# Patient Record
Sex: Female | Born: 1945 | Race: White | Hispanic: No | Marital: Married | Smoking: Never smoker
Health system: Southern US, Community
[De-identification: ages and names within clinical notes are randomized; demographics above are authoritative.]

## PROBLEM LIST (undated history)

## (undated) DIAGNOSIS — N809 Endometriosis, unspecified: Secondary | ICD-10-CM

## (undated) DIAGNOSIS — F32A Depression, unspecified: Secondary | ICD-10-CM

## (undated) DIAGNOSIS — K635 Polyp of colon: Secondary | ICD-10-CM

## (undated) DIAGNOSIS — E7800 Pure hypercholesterolemia, unspecified: Secondary | ICD-10-CM

## (undated) DIAGNOSIS — R000 Tachycardia, unspecified: Secondary | ICD-10-CM

## (undated) DIAGNOSIS — Z8619 Personal history of other infectious and parasitic diseases: Secondary | ICD-10-CM

## (undated) DIAGNOSIS — F329 Major depressive disorder, single episode, unspecified: Secondary | ICD-10-CM

## (undated) DIAGNOSIS — I10 Essential (primary) hypertension: Secondary | ICD-10-CM

## (undated) DIAGNOSIS — N200 Calculus of kidney: Secondary | ICD-10-CM

## (undated) DIAGNOSIS — F419 Anxiety disorder, unspecified: Secondary | ICD-10-CM

## (undated) DIAGNOSIS — I319 Disease of pericardium, unspecified: Secondary | ICD-10-CM

## (undated) DIAGNOSIS — E119 Type 2 diabetes mellitus without complications: Secondary | ICD-10-CM

## (undated) HISTORY — DX: Major depressive disorder, single episode, unspecified: F32.9

## (undated) HISTORY — PX: APPENDECTOMY: SHX54

## (undated) HISTORY — DX: Type 2 diabetes mellitus without complications: E11.9

## (undated) HISTORY — DX: Depression, unspecified: F32.A

## (undated) HISTORY — DX: Endometriosis, unspecified: N80.9

## (undated) HISTORY — PX: BACK SURGERY: SHX140

## (undated) HISTORY — DX: Polyp of colon: K63.5

## (undated) HISTORY — DX: Essential (primary) hypertension: I10

## (undated) HISTORY — DX: Disease of pericardium, unspecified: I31.9

## (undated) HISTORY — PX: CHOLECYSTECTOMY: SHX55

## (undated) HISTORY — DX: Personal history of other infectious and parasitic diseases: Z86.19

## (undated) HISTORY — DX: Pure hypercholesterolemia, unspecified: E78.00

## (undated) HISTORY — DX: Calculus of kidney: N20.0

## (undated) HISTORY — DX: Tachycardia, unspecified: R00.0

## (undated) HISTORY — DX: Anxiety disorder, unspecified: F41.9

## (undated) HISTORY — PX: TONSILLECTOMY: SUR1361

---

## 1978-09-24 HISTORY — PX: ABDOMINAL HYSTERECTOMY: SHX81

## 1980-09-24 HISTORY — PX: OOPHORECTOMY: SHX86

## 2004-10-24 ENCOUNTER — Inpatient Hospital Stay: Payer: Self-pay | Admitting: Internal Medicine

## 2005-03-31 ENCOUNTER — Other Ambulatory Visit: Payer: Self-pay

## 2005-03-31 ENCOUNTER — Inpatient Hospital Stay: Payer: Self-pay | Admitting: Rheumatology

## 2007-02-04 ENCOUNTER — Ambulatory Visit: Payer: Self-pay | Admitting: Emergency Medicine

## 2007-02-25 ENCOUNTER — Ambulatory Visit: Payer: Self-pay | Admitting: Internal Medicine

## 2007-04-07 ENCOUNTER — Ambulatory Visit: Payer: Self-pay | Admitting: Gastroenterology

## 2007-04-27 ENCOUNTER — Ambulatory Visit: Payer: Self-pay | Admitting: Internal Medicine

## 2007-05-19 ENCOUNTER — Ambulatory Visit: Payer: Self-pay | Admitting: Gastroenterology

## 2007-09-21 ENCOUNTER — Ambulatory Visit: Payer: Self-pay | Admitting: Emergency Medicine

## 2008-03-30 ENCOUNTER — Ambulatory Visit: Payer: Self-pay | Admitting: Gastroenterology

## 2008-04-13 ENCOUNTER — Ambulatory Visit: Payer: Self-pay | Admitting: Gastroenterology

## 2008-04-13 ENCOUNTER — Ambulatory Visit: Payer: Self-pay | Admitting: Internal Medicine

## 2008-05-16 ENCOUNTER — Ambulatory Visit: Payer: Self-pay | Admitting: Family Medicine

## 2009-01-24 ENCOUNTER — Emergency Department: Payer: Self-pay | Admitting: Emergency Medicine

## 2009-07-15 ENCOUNTER — Ambulatory Visit: Payer: Self-pay | Admitting: Family Medicine

## 2009-10-11 ENCOUNTER — Ambulatory Visit: Payer: Self-pay | Admitting: Family Medicine

## 2010-07-21 ENCOUNTER — Ambulatory Visit: Payer: Self-pay | Admitting: Gastroenterology

## 2010-07-24 ENCOUNTER — Ambulatory Visit: Payer: Self-pay | Admitting: Gastroenterology

## 2010-07-24 LAB — HM COLONOSCOPY

## 2011-05-03 ENCOUNTER — Ambulatory Visit: Payer: Self-pay

## 2011-10-15 DIAGNOSIS — I10 Essential (primary) hypertension: Secondary | ICD-10-CM | POA: Diagnosis not present

## 2011-10-15 DIAGNOSIS — E119 Type 2 diabetes mellitus without complications: Secondary | ICD-10-CM | POA: Diagnosis not present

## 2011-10-15 DIAGNOSIS — N390 Urinary tract infection, site not specified: Secondary | ICD-10-CM | POA: Diagnosis not present

## 2011-10-29 DIAGNOSIS — H4420 Degenerative myopia, unspecified eye: Secondary | ICD-10-CM | POA: Diagnosis not present

## 2011-10-29 DIAGNOSIS — H35059 Retinal neovascularization, unspecified, unspecified eye: Secondary | ICD-10-CM | POA: Diagnosis not present

## 2011-10-29 DIAGNOSIS — H35019 Changes in retinal vascular appearance, unspecified eye: Secondary | ICD-10-CM | POA: Diagnosis not present

## 2011-10-29 DIAGNOSIS — H33059 Total retinal detachment, unspecified eye: Secondary | ICD-10-CM | POA: Diagnosis not present

## 2011-11-07 DIAGNOSIS — N390 Urinary tract infection, site not specified: Secondary | ICD-10-CM | POA: Diagnosis not present

## 2011-11-26 DIAGNOSIS — J209 Acute bronchitis, unspecified: Secondary | ICD-10-CM | POA: Diagnosis not present

## 2011-11-26 DIAGNOSIS — J019 Acute sinusitis, unspecified: Secondary | ICD-10-CM | POA: Diagnosis not present

## 2011-11-26 DIAGNOSIS — R05 Cough: Secondary | ICD-10-CM | POA: Diagnosis not present

## 2011-11-28 DIAGNOSIS — K219 Gastro-esophageal reflux disease without esophagitis: Secondary | ICD-10-CM | POA: Diagnosis not present

## 2011-12-04 ENCOUNTER — Ambulatory Visit: Payer: Self-pay | Admitting: Gastroenterology

## 2011-12-04 DIAGNOSIS — R059 Cough, unspecified: Secondary | ICD-10-CM | POA: Diagnosis not present

## 2011-12-04 DIAGNOSIS — R1013 Epigastric pain: Secondary | ICD-10-CM | POA: Diagnosis not present

## 2011-12-04 DIAGNOSIS — K2940 Chronic atrophic gastritis without bleeding: Secondary | ICD-10-CM | POA: Diagnosis not present

## 2011-12-04 DIAGNOSIS — I10 Essential (primary) hypertension: Secondary | ICD-10-CM | POA: Diagnosis not present

## 2011-12-04 DIAGNOSIS — K2990 Gastroduodenitis, unspecified, without bleeding: Secondary | ICD-10-CM | POA: Diagnosis not present

## 2011-12-04 DIAGNOSIS — K219 Gastro-esophageal reflux disease without esophagitis: Secondary | ICD-10-CM | POA: Diagnosis not present

## 2011-12-04 DIAGNOSIS — K2970 Gastritis, unspecified, without bleeding: Secondary | ICD-10-CM | POA: Diagnosis not present

## 2011-12-04 DIAGNOSIS — Z7982 Long term (current) use of aspirin: Secondary | ICD-10-CM | POA: Diagnosis not present

## 2011-12-04 DIAGNOSIS — Z79899 Other long term (current) drug therapy: Secondary | ICD-10-CM | POA: Diagnosis not present

## 2011-12-04 DIAGNOSIS — E119 Type 2 diabetes mellitus without complications: Secondary | ICD-10-CM | POA: Diagnosis not present

## 2011-12-04 DIAGNOSIS — I498 Other specified cardiac arrhythmias: Secondary | ICD-10-CM | POA: Diagnosis not present

## 2011-12-25 DIAGNOSIS — M48061 Spinal stenosis, lumbar region without neurogenic claudication: Secondary | ICD-10-CM | POA: Diagnosis not present

## 2011-12-25 DIAGNOSIS — IMO0002 Reserved for concepts with insufficient information to code with codable children: Secondary | ICD-10-CM | POA: Diagnosis not present

## 2011-12-27 DIAGNOSIS — H35019 Changes in retinal vascular appearance, unspecified eye: Secondary | ICD-10-CM | POA: Diagnosis not present

## 2011-12-27 DIAGNOSIS — E119 Type 2 diabetes mellitus without complications: Secondary | ICD-10-CM | POA: Diagnosis not present

## 2011-12-27 DIAGNOSIS — Z961 Presence of intraocular lens: Secondary | ICD-10-CM | POA: Diagnosis not present

## 2011-12-27 DIAGNOSIS — H4050X0 Glaucoma secondary to other eye disorders, unspecified eye, stage unspecified: Secondary | ICD-10-CM | POA: Diagnosis not present

## 2012-01-07 DIAGNOSIS — I10 Essential (primary) hypertension: Secondary | ICD-10-CM | POA: Diagnosis not present

## 2012-01-07 DIAGNOSIS — E119 Type 2 diabetes mellitus without complications: Secondary | ICD-10-CM | POA: Diagnosis not present

## 2012-01-07 DIAGNOSIS — E7800 Pure hypercholesterolemia, unspecified: Secondary | ICD-10-CM | POA: Diagnosis not present

## 2012-01-09 DIAGNOSIS — R5381 Other malaise: Secondary | ICD-10-CM | POA: Diagnosis not present

## 2012-01-09 DIAGNOSIS — E7800 Pure hypercholesterolemia, unspecified: Secondary | ICD-10-CM | POA: Diagnosis not present

## 2012-01-09 DIAGNOSIS — E119 Type 2 diabetes mellitus without complications: Secondary | ICD-10-CM | POA: Diagnosis not present

## 2012-01-09 DIAGNOSIS — I10 Essential (primary) hypertension: Secondary | ICD-10-CM | POA: Diagnosis not present

## 2012-01-09 DIAGNOSIS — Z79899 Other long term (current) drug therapy: Secondary | ICD-10-CM | POA: Diagnosis not present

## 2012-01-10 DIAGNOSIS — M5030 Other cervical disc degeneration, unspecified cervical region: Secondary | ICD-10-CM | POA: Diagnosis not present

## 2012-01-10 DIAGNOSIS — IMO0002 Reserved for concepts with insufficient information to code with codable children: Secondary | ICD-10-CM | POA: Diagnosis not present

## 2012-01-10 DIAGNOSIS — M5126 Other intervertebral disc displacement, lumbar region: Secondary | ICD-10-CM | POA: Diagnosis not present

## 2012-01-10 DIAGNOSIS — M48061 Spinal stenosis, lumbar region without neurogenic claudication: Secondary | ICD-10-CM | POA: Diagnosis not present

## 2012-01-10 DIAGNOSIS — M47817 Spondylosis without myelopathy or radiculopathy, lumbosacral region: Secondary | ICD-10-CM | POA: Diagnosis not present

## 2012-02-04 DIAGNOSIS — H4420 Degenerative myopia, unspecified eye: Secondary | ICD-10-CM | POA: Diagnosis not present

## 2012-02-04 DIAGNOSIS — H33019 Retinal detachment with single break, unspecified eye: Secondary | ICD-10-CM | POA: Diagnosis not present

## 2012-02-04 DIAGNOSIS — H35019 Changes in retinal vascular appearance, unspecified eye: Secondary | ICD-10-CM | POA: Diagnosis not present

## 2012-03-18 DIAGNOSIS — M719 Bursopathy, unspecified: Secondary | ICD-10-CM | POA: Diagnosis not present

## 2012-03-18 DIAGNOSIS — IMO0002 Reserved for concepts with insufficient information to code with codable children: Secondary | ICD-10-CM | POA: Diagnosis not present

## 2012-03-18 DIAGNOSIS — M47817 Spondylosis without myelopathy or radiculopathy, lumbosacral region: Secondary | ICD-10-CM | POA: Diagnosis not present

## 2012-04-03 DIAGNOSIS — E7800 Pure hypercholesterolemia, unspecified: Secondary | ICD-10-CM | POA: Diagnosis not present

## 2012-04-03 DIAGNOSIS — I10 Essential (primary) hypertension: Secondary | ICD-10-CM | POA: Diagnosis not present

## 2012-04-03 DIAGNOSIS — E119 Type 2 diabetes mellitus without complications: Secondary | ICD-10-CM | POA: Diagnosis not present

## 2012-04-04 DIAGNOSIS — M25519 Pain in unspecified shoulder: Secondary | ICD-10-CM | POA: Diagnosis not present

## 2012-04-04 DIAGNOSIS — N951 Menopausal and female climacteric states: Secondary | ICD-10-CM | POA: Diagnosis not present

## 2012-04-04 DIAGNOSIS — E7800 Pure hypercholesterolemia, unspecified: Secondary | ICD-10-CM | POA: Diagnosis not present

## 2012-04-04 DIAGNOSIS — M67919 Unspecified disorder of synovium and tendon, unspecified shoulder: Secondary | ICD-10-CM | POA: Diagnosis not present

## 2012-04-10 ENCOUNTER — Ambulatory Visit: Payer: Self-pay | Admitting: Physical Medicine and Rehabilitation

## 2012-04-10 DIAGNOSIS — M67919 Unspecified disorder of synovium and tendon, unspecified shoulder: Secondary | ICD-10-CM | POA: Diagnosis not present

## 2012-04-10 DIAGNOSIS — M25519 Pain in unspecified shoulder: Secondary | ICD-10-CM | POA: Diagnosis not present

## 2012-04-10 DIAGNOSIS — S46819A Strain of other muscles, fascia and tendons at shoulder and upper arm level, unspecified arm, initial encounter: Secondary | ICD-10-CM | POA: Diagnosis not present

## 2012-04-10 DIAGNOSIS — M719 Bursopathy, unspecified: Secondary | ICD-10-CM | POA: Diagnosis not present

## 2012-04-15 DIAGNOSIS — I10 Essential (primary) hypertension: Secondary | ICD-10-CM | POA: Diagnosis not present

## 2012-04-17 DIAGNOSIS — H409 Unspecified glaucoma: Secondary | ICD-10-CM | POA: Diagnosis not present

## 2012-04-17 DIAGNOSIS — Z961 Presence of intraocular lens: Secondary | ICD-10-CM | POA: Diagnosis not present

## 2012-04-21 DIAGNOSIS — M25549 Pain in joints of unspecified hand: Secondary | ICD-10-CM | POA: Diagnosis not present

## 2012-04-21 DIAGNOSIS — IMO0002 Reserved for concepts with insufficient information to code with codable children: Secondary | ICD-10-CM | POA: Diagnosis not present

## 2012-04-21 DIAGNOSIS — M75120 Complete rotator cuff tear or rupture of unspecified shoulder, not specified as traumatic: Secondary | ICD-10-CM | POA: Diagnosis not present

## 2012-04-21 DIAGNOSIS — M47817 Spondylosis without myelopathy or radiculopathy, lumbosacral region: Secondary | ICD-10-CM | POA: Diagnosis not present

## 2012-05-12 DIAGNOSIS — H4420 Degenerative myopia, unspecified eye: Secondary | ICD-10-CM | POA: Diagnosis not present

## 2012-05-12 DIAGNOSIS — H3320 Serous retinal detachment, unspecified eye: Secondary | ICD-10-CM | POA: Diagnosis not present

## 2012-05-27 DIAGNOSIS — M47817 Spondylosis without myelopathy or radiculopathy, lumbosacral region: Secondary | ICD-10-CM | POA: Diagnosis not present

## 2012-05-27 DIAGNOSIS — IMO0002 Reserved for concepts with insufficient information to code with codable children: Secondary | ICD-10-CM | POA: Diagnosis not present

## 2012-05-27 DIAGNOSIS — M12819 Other specific arthropathies, not elsewhere classified, unspecified shoulder: Secondary | ICD-10-CM | POA: Diagnosis not present

## 2012-06-03 DIAGNOSIS — H6120 Impacted cerumen, unspecified ear: Secondary | ICD-10-CM | POA: Diagnosis not present

## 2012-06-03 DIAGNOSIS — H9319 Tinnitus, unspecified ear: Secondary | ICD-10-CM | POA: Diagnosis not present

## 2012-06-03 DIAGNOSIS — H903 Sensorineural hearing loss, bilateral: Secondary | ICD-10-CM | POA: Diagnosis not present

## 2012-06-24 DIAGNOSIS — M5126 Other intervertebral disc displacement, lumbar region: Secondary | ICD-10-CM | POA: Diagnosis not present

## 2012-06-24 DIAGNOSIS — M25559 Pain in unspecified hip: Secondary | ICD-10-CM | POA: Diagnosis not present

## 2012-06-24 DIAGNOSIS — Z23 Encounter for immunization: Secondary | ICD-10-CM | POA: Diagnosis not present

## 2012-06-24 DIAGNOSIS — IMO0002 Reserved for concepts with insufficient information to code with codable children: Secondary | ICD-10-CM | POA: Diagnosis not present

## 2012-06-24 DIAGNOSIS — M47817 Spondylosis without myelopathy or radiculopathy, lumbosacral region: Secondary | ICD-10-CM | POA: Diagnosis not present

## 2012-07-14 DIAGNOSIS — M25519 Pain in unspecified shoulder: Secondary | ICD-10-CM | POA: Diagnosis not present

## 2012-07-14 DIAGNOSIS — M47812 Spondylosis without myelopathy or radiculopathy, cervical region: Secondary | ICD-10-CM | POA: Diagnosis not present

## 2012-07-14 DIAGNOSIS — M4802 Spinal stenosis, cervical region: Secondary | ICD-10-CM | POA: Diagnosis not present

## 2012-07-30 ENCOUNTER — Ambulatory Visit: Payer: Self-pay | Admitting: Internal Medicine

## 2012-07-30 ENCOUNTER — Telehealth: Payer: Self-pay | Admitting: Internal Medicine

## 2012-07-30 NOTE — Telephone Encounter (Signed)
I can see her at 11:45 on 08/13/12.   Let me know if problem.

## 2012-07-30 NOTE — Telephone Encounter (Signed)
Pt was scheduled today but had to cancel because she takes her mom to her appointments as well. She apologizes and was wanting to know if she could see you sometime soon because she says she is having some problems ???

## 2012-07-31 NOTE — Telephone Encounter (Signed)
Left message for pt to call back so we could schedule.

## 2012-07-31 NOTE — Telephone Encounter (Signed)
Pt is aware of appointment 

## 2012-08-13 ENCOUNTER — Ambulatory Visit (INDEPENDENT_AMBULATORY_CARE_PROVIDER_SITE_OTHER): Payer: Medicare Other | Admitting: Internal Medicine

## 2012-08-13 ENCOUNTER — Other Ambulatory Visit: Payer: Self-pay | Admitting: Internal Medicine

## 2012-08-13 ENCOUNTER — Encounter: Payer: Self-pay | Admitting: Internal Medicine

## 2012-08-13 VITALS — BP 132/88 | HR 93 | Temp 98.0°F | Ht 64.5 in | Wt 177.8 lb

## 2012-08-13 DIAGNOSIS — N760 Acute vaginitis: Secondary | ICD-10-CM | POA: Diagnosis not present

## 2012-08-13 DIAGNOSIS — N390 Urinary tract infection, site not specified: Secondary | ICD-10-CM | POA: Diagnosis not present

## 2012-08-13 DIAGNOSIS — I10 Essential (primary) hypertension: Secondary | ICD-10-CM

## 2012-08-13 DIAGNOSIS — K219 Gastro-esophageal reflux disease without esophagitis: Secondary | ICD-10-CM

## 2012-08-13 DIAGNOSIS — M549 Dorsalgia, unspecified: Secondary | ICD-10-CM

## 2012-08-13 DIAGNOSIS — G8929 Other chronic pain: Secondary | ICD-10-CM

## 2012-08-13 DIAGNOSIS — E7800 Pure hypercholesterolemia, unspecified: Secondary | ICD-10-CM | POA: Diagnosis not present

## 2012-08-13 DIAGNOSIS — E119 Type 2 diabetes mellitus without complications: Secondary | ICD-10-CM

## 2012-08-13 LAB — POCT URINALYSIS DIPSTICK
Blood, UA: NEGATIVE
Glucose, UA: NEGATIVE
Ketones, UA: NEGATIVE
Protein, UA: NEGATIVE
Spec Grav, UA: 1.02
Urobilinogen, UA: 1

## 2012-08-13 MED ORDER — NYSTATIN 100000 UNIT/GM EX CREA: TOPICAL_CREAM | Freq: Two times a day (BID) | CUTANEOUS | Status: DC

## 2012-08-13 NOTE — Patient Instructions (Addendum)
It was nice seeing you today.  We will notify you of your results once they are available.  I am going to give you nystatin cream to apply externally - twice a day. Let me know if problems.

## 2012-08-15 DIAGNOSIS — H3320 Serous retinal detachment, unspecified eye: Secondary | ICD-10-CM | POA: Diagnosis not present

## 2012-08-15 DIAGNOSIS — H3530 Unspecified macular degeneration: Secondary | ICD-10-CM | POA: Diagnosis not present

## 2012-08-16 LAB — WET PREP BY MOLECULAR PROBE
Candida species: POSITIVE — AB
Trichomonas vaginosis: NEGATIVE

## 2012-08-18 ENCOUNTER — Other Ambulatory Visit (INDEPENDENT_AMBULATORY_CARE_PROVIDER_SITE_OTHER): Payer: Medicare Other

## 2012-08-18 DIAGNOSIS — I10 Essential (primary) hypertension: Secondary | ICD-10-CM

## 2012-08-18 DIAGNOSIS — E119 Type 2 diabetes mellitus without complications: Secondary | ICD-10-CM | POA: Diagnosis not present

## 2012-08-18 DIAGNOSIS — E7800 Pure hypercholesterolemia, unspecified: Secondary | ICD-10-CM

## 2012-08-18 LAB — BASIC METABOLIC PANEL
Chloride: 104 mEq/L (ref 96–112)
GFR: 110.39 mL/min (ref >60.00)
Potassium: 3.8 mEq/L (ref 3.5–5.1)
Sodium: 138 mEq/L (ref 135–145)

## 2012-08-18 LAB — LIPID PANEL
HDL: 53.2 mg/dL (ref >39.00)
Triglycerides: 81 mg/dL (ref 0.0–149.0)

## 2012-08-18 LAB — HEPATIC FUNCTION PANEL
ALT: 18 U/L (ref 0–35)
AST: 15 U/L (ref 0–37)
Albumin: 3.9 g/dL (ref 3.5–5.2)
Alkaline Phosphatase: 66 U/L (ref 39–117)

## 2012-08-18 LAB — HEMOGLOBIN A1C: Hgb A1c MFr Bld: 6.7 % — ABNORMAL HIGH (ref 4.6–6.5)

## 2012-08-20 ENCOUNTER — Encounter: Payer: Self-pay | Admitting: *Deleted

## 2012-08-24 ENCOUNTER — Encounter: Payer: Self-pay | Admitting: Internal Medicine

## 2012-08-24 DIAGNOSIS — E119 Type 2 diabetes mellitus without complications: Secondary | ICD-10-CM | POA: Insufficient documentation

## 2012-08-24 DIAGNOSIS — I10 Essential (primary) hypertension: Secondary | ICD-10-CM | POA: Insufficient documentation

## 2012-08-24 DIAGNOSIS — E7800 Pure hypercholesterolemia, unspecified: Secondary | ICD-10-CM | POA: Insufficient documentation

## 2012-08-24 DIAGNOSIS — G8929 Other chronic pain: Secondary | ICD-10-CM | POA: Insufficient documentation

## 2012-08-24 DIAGNOSIS — K219 Gastro-esophageal reflux disease without esophagitis: Secondary | ICD-10-CM | POA: Insufficient documentation

## 2012-08-24 NOTE — Assessment & Plan Note (Signed)
Blood pressure under good control.  Same meds.  Check metabolic panel with next labs.    

## 2012-08-24 NOTE — Assessment & Plan Note (Signed)
On lipitor.  Low cholesterol diet.  Check lipid panel and liver function.   

## 2012-08-24 NOTE — Assessment & Plan Note (Signed)
Low carb diet and exercise.  Did not bring in any sugar readings.  Check blood sugar bid.  Record.  Up to date with eye checks.  Check met b and a1c.

## 2012-08-24 NOTE — Assessment & Plan Note (Signed)
EGD 12/04/11 revealed gastritis.  On Protonix and carafate.  Symptoms improved.  Continue follow up with Dr Marva Panda.

## 2012-08-24 NOTE — Assessment & Plan Note (Signed)
Has seen neurosurgery.  Not interested in surgery.  Has had multiple injections.  Seeing Dr Yves Dill.  Continue follow up with him and ortho.  On vicodin and neurontin.

## 2012-08-24 NOTE — Progress Notes (Signed)
Subjective:    Patient ID: Sherry Simon, female    DOB: 02-08-1946, 66 y.o.   MRN: [id number]  HPI 66 year old female with past history of tachycardia, palpitations, anxiety/depression, diabetes, hypertension and pericarditis of unknown origin.  She comes in today for a scheduled follow up.  Seeing Dr Yves Dill.  S/p injections.  Helping.  Having some issues with her left rotator cuff.  Takes vicodin and neurontin.  Is having some perivaginal irritation and some vaginal discharge.  No itching.  Wants to remain on this for now.  Some occasional acid reflux.  On protonix.  Will add zantac.  No chest pain or tightness reported.  Increased stress with her husband's medical issues.  She feels she is handling things relatively well.   Past Medical History  Diagnosis Date  . Hypertension   . Diabetes mellitus   . Hypercholesterolemia   . Endometriosis     requiring hysterectomy  . Nephrolithiasis   . Anxiety and depression   . Pericarditis     recurrent, unkown origin  . Tachycardia   . Depression   . History of chicken pox   . Colon polyps     Review of Systems Patient denies any headache, lightheadedness or dizziness.  No significant sinus or allergy symptoms.  No chest pain, tightness or palpitations.  No increased shortness of breath, cough or congestion.  Breathing stable.  No nausea or vomiting.  No abdominal pain or cramping.  No bowel change, such as diarrhea, constipation, BRBPR or melana.  No urine change.  She does report the perivaginal burning and discharge as outlined.         Objective:   Physical Exam Filed Vitals:   08/13/12 1132  BP: 132/88  Pulse: 93  Temp: 98 F (68.50 C)   66 year old female in no acute distress.   HEENT:  Nares- clear.  Oropharynx - without lesions. NECK:  Supple.  Nontender.  No audible bruit.  HEART:  Appears to be regular. LUNGS:  No crackles or wheezing audible.  Respirations even and unlabored.  RADIAL PULSE:  Equal bilaterally.    ABDOMEN:  Soft, nontender.  Bowel sounds present and normal.  No audible abdominal bruit.  GU:  Normal external genitalia - minimal erythema perivaginal region.   Vaginal vault without lesions.  Discharge present. Sent off for KOH and wet prep.  Could not appreciate any adnexal masses or tenderness.  EXTREMITIES:  No increased edema present.  DP pulses palpable and equal bilaterally.           Assessment & Plan:  GU.  Perivaginal irritation.  Probable yeast. Nystatin cream as directed.  Await results of KOH and wet prep.    CARDIOVASCULAR.  ECHO 05/23/10 revealed EF 60% with mild left atrial enlargement and moderate mitral insufficiency and mild tricuspid insufficiency.  Sees Dr Okey Dupre [facility] cardiology).  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  Currently asymptomatic.    PULMONARY.  Breathing stable.  Follow.   FATIGUE.  Probably multifactorial.  Check cbc, metabolic panel and tsh.    INCREASED PSYCHOSOCIAL STRESSORS.  Increased stress with her husbands medical issues.  Increased stress with her chronic pain.  On effexor.  She feels she is handling things relatively well.  Follow.    GI.  Colonoscopy 07/24/10 revealed diverticulosis and removal of a polyp (ascending colon) - tubular adenomatous polyp.  Recommended follow up colonoscopy five years.    HEALTH MAINTENANCE.  Physical 08/27/11.  Is s/p  hysterectomy and does not require yearly pap smears.  Colonoscopy as outlined.  Mammogram 08/20/11 - negative.  Schedule follow up mammogram.

## 2012-08-28 DIAGNOSIS — Z5189 Encounter for other specified aftercare: Secondary | ICD-10-CM | POA: Diagnosis not present

## 2012-08-28 DIAGNOSIS — Z961 Presence of intraocular lens: Secondary | ICD-10-CM | POA: Diagnosis not present

## 2012-08-28 DIAGNOSIS — H0289 Other specified disorders of eyelid: Secondary | ICD-10-CM | POA: Diagnosis not present

## 2012-08-28 DIAGNOSIS — H4420 Degenerative myopia, unspecified eye: Secondary | ICD-10-CM | POA: Diagnosis not present

## 2012-09-05 ENCOUNTER — Other Ambulatory Visit: Payer: Self-pay | Admitting: Internal Medicine

## 2012-09-05 DIAGNOSIS — IMO0002 Reserved for concepts with insufficient information to code with codable children: Secondary | ICD-10-CM | POA: Diagnosis not present

## 2012-09-05 DIAGNOSIS — M47817 Spondylosis without myelopathy or radiculopathy, lumbosacral region: Secondary | ICD-10-CM | POA: Diagnosis not present

## 2012-09-05 DIAGNOSIS — M5126 Other intervertebral disc displacement, lumbar region: Secondary | ICD-10-CM | POA: Diagnosis not present

## 2012-09-05 NOTE — Telephone Encounter (Signed)
Called in refill x 6 to [facility]

## 2012-09-05 NOTE — Telephone Encounter (Signed)
Refill - Pharmacy - [facility] [address]  Valinda Hoar #[phone] Drug Name-Estradiol TDS 0.1 mg /day Strength - Directions- apply 1 patch to skin once a week as directed  Quantity-prescribed  2 refills

## 2012-09-05 NOTE — Telephone Encounter (Signed)
Pt is needing refill on Estradiol patch. [facility] [place]. Pt is completely out.

## 2012-09-09 ENCOUNTER — Encounter: Payer: Self-pay | Admitting: Internal Medicine

## 2012-09-09 ENCOUNTER — Ambulatory Visit (INDEPENDENT_AMBULATORY_CARE_PROVIDER_SITE_OTHER): Payer: Medicare Other | Admitting: Internal Medicine

## 2012-09-09 VITALS — BP 132/74 | HR 94 | Temp 98.4°F | Ht 64.5 in | Wt 179.5 lb

## 2012-09-09 DIAGNOSIS — N390 Urinary tract infection, site not specified: Secondary | ICD-10-CM | POA: Diagnosis not present

## 2012-09-09 DIAGNOSIS — I10 Essential (primary) hypertension: Secondary | ICD-10-CM

## 2012-09-09 LAB — POCT URINALYSIS DIPSTICK
Spec Grav, UA: >=1.03
pH, UA: 6

## 2012-09-09 MED ORDER — NITROFURANTOIN MONOHYD MACRO 100 MG PO CAPS: 100.0000 mg | ORAL_CAPSULE | Freq: Two times a day (BID) | ORAL | Status: DC

## 2012-09-10 ENCOUNTER — Ambulatory Visit: Payer: Self-pay | Admitting: Internal Medicine

## 2012-09-13 LAB — URINE CULTURE

## 2012-09-14 ENCOUNTER — Encounter: Payer: Self-pay | Admitting: Internal Medicine

## 2012-09-14 NOTE — Assessment & Plan Note (Signed)
Blood pressure under good control.  Follow.   

## 2012-09-14 NOTE — Progress Notes (Signed)
  Subjective:    Patient ID: Sherry Simon, female    DOB: April 18, 1946, 66 y.o.   MRN: [id number]  Urinary Tract Infection   66 year old female with past history of tachycardia, palpitations, anxiety/depression, diabetes, hypertension and pericarditis of unknown origin.  She comes in today as a work in with concerns regarding a possible urinary tract infection.  She states symptoms started last week. Some irritation on the outside of her vagina.  She has been using Nystatin.  Helps some when it first goes on.  Occasional itching.  She has now developed some dysuria.   No chest pain or tightness reported.  Increased stress with her husband's medical issues.  She feels she is handling things relatively well.   Past Medical History  Diagnosis Date  . Hypertension   . Diabetes mellitus   . Hypercholesterolemia   . Endometriosis     requiring hysterectomy  . Nephrolithiasis   . Anxiety and depression   . Pericarditis     recurrent, unkown origin  . Tachycardia   . Depression   . History of chicken pox   . Colon polyps     Review of Systems Patient denies any headache, lightheadedness or dizziness.  No significant sinus or allergy symptoms.  No chest pain, tightness or palpitations.  No increased shortness of breath, cough or congestion.  Breathing stable.  No nausea or vomiting.  No abdominal pain or cramping.  No bowel change, such as diarrhea, constipation, BRBPR or melana.  Some dysuria as outlined.  She does report the perivaginal burning as outlined.  No discharge.          Objective:   Physical Exam  Filed Vitals:   09/09/12 1432  BP: 132/74  Pulse: 94  Temp: 98.4 F (83.41 C)   66 year old female in no acute distress.  NECK:  Supple.  Nontender.    HEART:  Appears to be regular. LUNGS:  No crackles or wheezing audible.  Respirations even and unlabored.  RADIAL PULSE:  Equal bilaterally.  ABDOMEN:  Soft, nontender.  Bowel sounds present and normal.  No audible abdominal  bruit.  EXTREMITIES:  No increased edema present.  DP pulses palpable and equal bilaterally.           Assessment & Plan:  GU.  Perivaginal irritation.  Nystatin helped.  Continue.    UTI.  Urine dip positive.  Will send culture.  Treat with cipro as directed.  Follow.  Await culture results.    CARDIOVASCULAR.  ECHO 05/23/10 revealed EF 60% with mild left atrial enlargement and moderate mitral insufficiency and mild tricuspid insufficiency.  Sees Dr Okey Dupre [facility] cardiology).  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  Currently asymptomatic.    PULMONARY.  Breathing stable.  Follow.     INCREASED PSYCHOSOCIAL STRESSORS.  Increased stress with her husbands medical issues.  Increased stress with her chronic pain.  On effexor.  She feels she is handling things relatively well.  Follow.    GI.  Colonoscopy 07/24/10 revealed diverticulosis and removal of a polyp (ascending colon) - tubular adenomatous polyp.  Recommended follow up colonoscopy five years.    HEALTH MAINTENANCE.  Physical 08/27/11.  Is s/p hysterectomy and does not require yearly pap smears.  Colonoscopy as outlined.  Mammogram 08/20/11 - negative.  Mammogram should be scheduled.

## 2012-09-16 MED ORDER — CEFUROXIME AXETIL 250 MG PO TABS: 250.0000 mg | ORAL_TABLET | Freq: Two times a day (BID) | ORAL | Status: DC

## 2012-09-16 NOTE — Addendum Note (Signed)
Addended by: Marlene Lard on: 09/16/2012 02:11 PM   Modules accepted: Orders

## 2012-10-06 ENCOUNTER — Other Ambulatory Visit: Payer: Self-pay | Admitting: Internal Medicine

## 2012-10-06 MED ORDER — ATORVASTATIN CALCIUM 40 MG PO TABS: 40.0000 mg | ORAL_TABLET | Freq: Every day | ORAL | Status: DC

## 2012-10-06 NOTE — Telephone Encounter (Signed)
Sent in to pharmacy.  

## 2012-10-06 NOTE — Telephone Encounter (Signed)
atorvastatin (LIPITOR) 40 MG tablet   # 90

## 2012-10-10 ENCOUNTER — Encounter: Payer: Self-pay | Admitting: Internal Medicine

## 2012-10-13 ENCOUNTER — Ambulatory Visit (INDEPENDENT_AMBULATORY_CARE_PROVIDER_SITE_OTHER): Payer: Medicare Other | Admitting: Internal Medicine

## 2012-10-13 ENCOUNTER — Encounter: Payer: Self-pay | Admitting: Internal Medicine

## 2012-10-13 VITALS — BP 130/72 | HR 99 | Temp 98.8°F | Ht 64.5 in

## 2012-10-13 DIAGNOSIS — E7800 Pure hypercholesterolemia, unspecified: Secondary | ICD-10-CM | POA: Diagnosis not present

## 2012-10-13 DIAGNOSIS — E119 Type 2 diabetes mellitus without complications: Secondary | ICD-10-CM | POA: Diagnosis not present

## 2012-10-13 DIAGNOSIS — G8929 Other chronic pain: Secondary | ICD-10-CM | POA: Diagnosis not present

## 2012-10-13 DIAGNOSIS — I10 Essential (primary) hypertension: Secondary | ICD-10-CM

## 2012-10-13 DIAGNOSIS — N390 Urinary tract infection, site not specified: Secondary | ICD-10-CM

## 2012-10-13 DIAGNOSIS — I319 Disease of pericardium, unspecified: Secondary | ICD-10-CM | POA: Diagnosis not present

## 2012-10-13 DIAGNOSIS — M549 Dorsalgia, unspecified: Secondary | ICD-10-CM | POA: Diagnosis not present

## 2012-10-13 DIAGNOSIS — K219 Gastro-esophageal reflux disease without esophagitis: Secondary | ICD-10-CM

## 2012-10-13 LAB — POCT URINALYSIS DIPSTICK
Glucose, UA: NEGATIVE
Leukocytes, UA: NEGATIVE
Nitrite, UA: NEGATIVE
Urobilinogen, UA: 0.2

## 2012-10-13 LAB — CBC WITH DIFFERENTIAL/PLATELET
Eosinophils Absolute: 0.1 10*3/uL (ref 0.0–0.7)
Eosinophils Relative: 2.2 % (ref 0.0–5.0)
Lymphocytes Relative: 41.4 % (ref 12.0–46.0)
MCHC: 33.8 g/dL (ref 30.0–36.0)
MCV: 89 fl (ref 78.0–100.0)
Monocytes Absolute: 0.4 10*3/uL (ref 0.1–1.0)
Neutrophils Relative %: 48.5 % (ref 43.0–77.0)
Platelets: 344 10*3/uL (ref 150.0–400.0)
WBC: 5.1 10*3/uL (ref 4.5–10.5)

## 2012-10-13 LAB — BASIC METABOLIC PANEL
Calcium: 8.9 mg/dL (ref 8.4–10.5)
GFR: 100.3 mL/min (ref >60.00)
Potassium: 4.1 mEq/L (ref 3.5–5.1)
Sodium: 138 mEq/L (ref 135–145)

## 2012-10-13 LAB — SEDIMENTATION RATE: Sed Rate: 11 mm/hr (ref 0–22)

## 2012-10-14 LAB — URINE CULTURE
Colony Count: NO GROWTH
Organism ID, Bacteria: NO GROWTH

## 2012-10-20 DIAGNOSIS — IMO0002 Reserved for concepts with insufficient information to code with codable children: Secondary | ICD-10-CM | POA: Diagnosis not present

## 2012-10-20 DIAGNOSIS — M5126 Other intervertebral disc displacement, lumbar region: Secondary | ICD-10-CM | POA: Diagnosis not present

## 2012-10-20 DIAGNOSIS — M47817 Spondylosis without myelopathy or radiculopathy, lumbosacral region: Secondary | ICD-10-CM | POA: Diagnosis not present

## 2012-10-20 DIAGNOSIS — M25519 Pain in unspecified shoulder: Secondary | ICD-10-CM | POA: Diagnosis not present

## 2012-10-22 ENCOUNTER — Encounter: Payer: Self-pay | Admitting: Internal Medicine

## 2012-10-22 NOTE — Assessment & Plan Note (Signed)
On nexium.  Symptoms controlled.  Follow.

## 2012-10-22 NOTE — Progress Notes (Signed)
Subjective:    Patient ID: Sherry Simon, female    DOB: 03/26/1946, 67 y.o.   MRN: [id number]  Urinary Tract Infection   67 year old female with past history of tachycardia, palpitations, anxiety/depression, diabetes, hypertension and pericarditis of unknown origin.  She comes in today to follow up on these issues as well as for a complete physical exam.  Increased stress with her husband's medical issues.  She feels she is handling things relatively well.  She is having some increased pain in her left shoulder and right fourth finger.  Has a rotator cuff tear.  Has been seeing Dr Yves Dill.  Had an injection approximately one month ago.  Helped some.  Has follow up 10/22/12.  States her sugars in the am are averaging less than 130.  Blood pressure has been doing well.  She has noticed right side chest pain.  Increased over the last three weeks.  Hurts to inhale.  Sore to touch.  Was questioning the possibility of the start of a pericarditis flare.  No cough or congestion.  No change in breathing.   Past Medical History  Diagnosis Date  . Hypertension   . Diabetes mellitus   . Hypercholesterolemia   . Endometriosis     requiring hysterectomy  . Nephrolithiasis   . Anxiety and depression   . Pericarditis     recurrent, unkown origin  . Tachycardia   . Depression   . History of chicken pox   . Colon polyps     Current Outpatient Prescriptions on File Prior to Visit  Medication Sig Dispense Refill  . aspirin 81 MG tablet Take 81 mg by mouth daily.      Marland Kitchen atorvastatin (LIPITOR) 40 MG tablet Take 1 tablet (40 mg total) by mouth daily.  90 tablet  1  . brimonidine (ALPHAGAN) 0.15 % ophthalmic solution 1 drop 2 (two) times daily.      . cefUROXime (CEFTIN) 250 MG tablet Take 1 tablet (250 mg total) by mouth 2 (two) times daily.  14 tablet  0  . esomeprazole (NEXIUM) 40 MG capsule Take 40 mg by mouth 2 (two) times daily.      Marland Kitchen estradiol (CLIMARA - DOSED IN MG/24 HR) 0.1 mg/24hr Place 1  patch onto the skin once a week.      . gabapentin (NEURONTIN) 300 MG capsule 300 mg. Take 3 capsules q hs      . HYDROcodone-acetaminophen (NORCO/VICODIN) 5-325 MG per tablet Take 1 tablet by mouth as needed.      Marland Kitchen losartan (COZAAR) 100 MG tablet Take 100 mg by mouth daily.      . metFORMIN (GLUCOPHAGE) 500 MG tablet 500 mg. Take 2 tablets q am and 1 q pm      . nitrofurantoin, macrocrystal-monohydrate, (MACROBID) 100 MG capsule Take 1 capsule (100 mg total) by mouth 2 (two) times daily.  10 capsule  0  . nystatin cream (MYCOSTATIN) Apply topically 2 (two) times daily.  30 g  0  . pantoprazole (PROTONIX) 40 MG tablet Take 40 mg by mouth daily.      Marland Kitchen telmisartan (MICARDIS) 80 MG tablet Take 80 mg by mouth daily.      Marland Kitchen venlafaxine XR (EFFEXOR-XR) 150 MG 24 hr capsule Take 150 mg by mouth daily.        Review of Systems Patient denies any headache, lightheadedness or dizziness.  No significant sinus or allergy symptoms.  No chest pain, tightness or palpitations.  No increased shortness of  breath, cough or congestion.  Breathing stable.  No nausea or vomiting.  No abdominal pain or cramping.  No bowel change, such as diarrhea, constipation, BRBPR or melana.  Some dysuria as outlined.  She does report the perivaginal burning as outlined.  No discharge.          Objective:   Physical Exam  Filed Vitals:   10/13/12 0906  BP: 130/72  Pulse: 99  Temp: 98.8 F (37.1 C)   Blood pressure recheck:  118/74, pulse 22  67 year old female in no acute distress.   HEENT:  Nares- clear.  Oropharynx - without lesions. NECK:  Supple.  Nontender.  No audible bruit.  HEART:  Appears to be regular. LUNGS:  No crackles or wheezing audible.  Respirations even and unlabored.  RADIAL PULSE:  Equal bilaterally.    BREASTS:  No nipple discharge or nipple retraction present.  Could not appreciate any distinct nodules or axillary adenopathy.  CHEST:  Tenderness to palpation over the anterior chest wall.   Reproducible pain.  ABDOMEN:  Soft, nontender.  Bowel sounds present and normal.  No audible abdominal bruit.  GU:  Normal external genitalia.  Vaginal vault without lesions.  S/p hysterectomy.  Could not appreciate any adnexal masses or tenderness.   RECTAL:  Heme negative.   EXTREMITIES:  No increased edema present.  DP pulses palpable and equal bilaterally.          Assessment & Plan:  GU.  Perivaginal irritation.  Nystatin helped.  Continue.  Follow.    PREVIOUS UTI.  Urinary symptoms resolved.  Follow.      CARDIOVASCULAR.  ECHO 05/23/10 revealed EF 60% with mild left atrial enlargement and moderate mitral insufficiency and mild tricuspid insufficiency.  Sees Dr Okey Dupre [facility] cardiology).  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  Chest pain as outlined.  She declined EKG.  Will check ESR.  Follow.  Desires no further intervention and testing.    PULMONARY.  Breathing stable.  Follow.     INCREASED PSYCHOSOCIAL STRESSORS.  Increased stress with her husbands medical issues.  Increased stress with her chronic pain.  On effexor.  She feels she is handling things relatively well.  Follow.    GI.  Colonoscopy 07/24/10 revealed diverticulosis and removal of a polyp (ascending colon) - tubular adenomatous polyp.  Recommended follow up colonoscopy five years.    HEALTH MAINTENANCE.  Physical today.  Is s/p hysterectomy and does not require yearly pap smears.  Colonoscopy as outlined.  Mammogram 08/20/11 - negative.  Mammogram should be scheduled.

## 2012-10-22 NOTE — Assessment & Plan Note (Signed)
Blood pressure under good control.  Same medication regimen.  Check metabolic panel.    

## 2012-10-22 NOTE — Assessment & Plan Note (Signed)
On lipitor.  Low cholesterol diet and exercise.  Check lipid panel and liver function.   

## 2012-10-22 NOTE — Assessment & Plan Note (Signed)
Seeing Dr Yves Dill.  Has follow up at the end of this month.

## 2012-10-22 NOTE — Assessment & Plan Note (Signed)
Brought in no sugar readings.  States under good control.  Check met b and a1c.

## 2012-10-30 DIAGNOSIS — H409 Unspecified glaucoma: Secondary | ICD-10-CM | POA: Diagnosis not present

## 2012-10-30 DIAGNOSIS — H4011X0 Primary open-angle glaucoma, stage unspecified: Secondary | ICD-10-CM | POA: Diagnosis not present

## 2012-11-11 ENCOUNTER — Ambulatory Visit: Payer: Self-pay | Admitting: Family Medicine

## 2012-11-11 DIAGNOSIS — Z9889 Other specified postprocedural states: Secondary | ICD-10-CM | POA: Diagnosis not present

## 2012-11-11 DIAGNOSIS — B9789 Other viral agents as the cause of diseases classified elsewhere: Secondary | ICD-10-CM | POA: Diagnosis not present

## 2012-11-11 DIAGNOSIS — F329 Major depressive disorder, single episode, unspecified: Secondary | ICD-10-CM | POA: Diagnosis not present

## 2012-11-11 DIAGNOSIS — Z7982 Long term (current) use of aspirin: Secondary | ICD-10-CM | POA: Diagnosis not present

## 2012-11-11 DIAGNOSIS — Z79899 Other long term (current) drug therapy: Secondary | ICD-10-CM | POA: Diagnosis not present

## 2012-11-11 DIAGNOSIS — I10 Essential (primary) hypertension: Secondary | ICD-10-CM | POA: Diagnosis not present

## 2012-11-11 LAB — RAPID INFLUENZA A&B ANTIGENS

## 2012-11-14 DIAGNOSIS — Z9189 Other specified personal risk factors, not elsewhere classified: Secondary | ICD-10-CM | POA: Diagnosis not present

## 2012-11-14 DIAGNOSIS — H35059 Retinal neovascularization, unspecified, unspecified eye: Secondary | ICD-10-CM | POA: Diagnosis not present

## 2012-11-14 DIAGNOSIS — N63 Unspecified lump in unspecified breast: Secondary | ICD-10-CM | POA: Diagnosis not present

## 2012-11-14 DIAGNOSIS — H3530 Unspecified macular degeneration: Secondary | ICD-10-CM | POA: Diagnosis not present

## 2012-11-14 DIAGNOSIS — Z803 Family history of malignant neoplasm of breast: Secondary | ICD-10-CM | POA: Diagnosis not present

## 2012-11-14 DIAGNOSIS — H4420 Degenerative myopia, unspecified eye: Secondary | ICD-10-CM | POA: Diagnosis not present

## 2012-11-14 DIAGNOSIS — H3320 Serous retinal detachment, unspecified eye: Secondary | ICD-10-CM | POA: Diagnosis not present

## 2012-11-14 LAB — HM MAMMOGRAPHY

## 2012-11-20 DIAGNOSIS — C50419 Malignant neoplasm of upper-outer quadrant of unspecified female breast: Secondary | ICD-10-CM | POA: Diagnosis not present

## 2012-11-20 DIAGNOSIS — N63 Unspecified lump in unspecified breast: Secondary | ICD-10-CM | POA: Diagnosis not present

## 2012-11-20 DIAGNOSIS — Z139 Encounter for screening, unspecified: Secondary | ICD-10-CM | POA: Diagnosis not present

## 2012-11-20 DIAGNOSIS — C50919 Malignant neoplasm of unspecified site of unspecified female breast: Secondary | ICD-10-CM | POA: Diagnosis not present

## 2012-11-20 DIAGNOSIS — R928 Other abnormal and inconclusive findings on diagnostic imaging of breast: Secondary | ICD-10-CM | POA: Diagnosis not present

## 2012-11-21 ENCOUNTER — Other Ambulatory Visit: Payer: Self-pay | Admitting: Internal Medicine

## 2012-11-21 NOTE — Telephone Encounter (Signed)
Refill on Effixer (Generic) and [facility] [place].

## 2012-11-26 ENCOUNTER — Encounter: Payer: Self-pay | Admitting: Internal Medicine

## 2012-11-26 MED ORDER — VENLAFAXINE HCL ER 150 MG PO CP24: 150.0000 mg | ORAL_CAPSULE | Freq: Every day | ORAL | Status: DC

## 2012-11-26 NOTE — Telephone Encounter (Signed)
Is it okay to refill medication. Please advise quality and refills.

## 2012-11-27 ENCOUNTER — Other Ambulatory Visit: Payer: Self-pay | Admitting: *Deleted

## 2012-11-30 ENCOUNTER — Telehealth: Payer: Self-pay | Admitting: Internal Medicine

## 2012-11-30 MED ORDER — LOSARTAN POTASSIUM 100 MG PO TABS: 100.0000 mg | ORAL_TABLET | Freq: Every day | ORAL | Status: DC

## 2012-11-30 NOTE — Telephone Encounter (Signed)
Ordered refills for losartan. Phoned in.

## 2012-12-03 DIAGNOSIS — C50919 Malignant neoplasm of unspecified site of unspecified female breast: Secondary | ICD-10-CM | POA: Diagnosis not present

## 2012-12-11 ENCOUNTER — Encounter: Payer: Self-pay | Admitting: Internal Medicine

## 2012-12-16 ENCOUNTER — Telehealth: Payer: Self-pay | Admitting: *Deleted

## 2012-12-16 DIAGNOSIS — E7800 Pure hypercholesterolemia, unspecified: Secondary | ICD-10-CM

## 2012-12-16 DIAGNOSIS — E119 Type 2 diabetes mellitus without complications: Secondary | ICD-10-CM

## 2012-12-16 DIAGNOSIS — K219 Gastro-esophageal reflux disease without esophagitis: Secondary | ICD-10-CM

## 2012-12-16 DIAGNOSIS — I10 Essential (primary) hypertension: Secondary | ICD-10-CM

## 2012-12-16 NOTE — Telephone Encounter (Signed)
Pt is coming in tomorrow 03.26.2014 what labs and dx would you like? 

## 2012-12-17 ENCOUNTER — Telehealth: Payer: Self-pay | Admitting: Internal Medicine

## 2012-12-17 ENCOUNTER — Other Ambulatory Visit (INDEPENDENT_AMBULATORY_CARE_PROVIDER_SITE_OTHER): Payer: Medicare Other

## 2012-12-17 DIAGNOSIS — E7800 Pure hypercholesterolemia, unspecified: Secondary | ICD-10-CM | POA: Diagnosis not present

## 2012-12-17 DIAGNOSIS — E119 Type 2 diabetes mellitus without complications: Secondary | ICD-10-CM

## 2012-12-17 LAB — LIPID PANEL
Cholesterol: 143 mg/dL (ref 0–200)
HDL: 53.9 mg/dL (ref >39.00)
Triglycerides: 97 mg/dL (ref 0.0–149.0)
VLDL: 19.4 mg/dL (ref 0.0–40.0)

## 2012-12-17 LAB — HEPATIC FUNCTION PANEL
Bilirubin, Direct: 0.1 mg/dL (ref 0.0–0.3)
Total Bilirubin: 0.9 mg/dL (ref 0.3–1.2)

## 2012-12-17 LAB — BASIC METABOLIC PANEL
BUN: 12 mg/dL (ref 6–23)
Calcium: 9.7 mg/dL (ref 8.4–10.5)
Creatinine, Ser: 0.7 mg/dL (ref 0.4–1.2)
GFR: 88.77 mL/min (ref >60.00)

## 2012-12-17 LAB — MICROALBUMIN / CREATININE URINE RATIO: Microalb Creat Ratio: 0.3 mg/g (ref 0.0–30.0)

## 2012-12-17 NOTE — Telephone Encounter (Signed)
I placed order for labs.  Thanks  

## 2012-12-17 NOTE — Telephone Encounter (Signed)
Notified of labs via my chart 

## 2012-12-18 DIAGNOSIS — Z01818 Encounter for other preprocedural examination: Secondary | ICD-10-CM | POA: Diagnosis not present

## 2012-12-20 ENCOUNTER — Telehealth: Payer: Self-pay | Admitting: Internal Medicine

## 2012-12-20 MED ORDER — PANTOPRAZOLE SODIUM 40 MG PO TBEC: 40.0000 mg | DELAYED_RELEASE_TABLET | Freq: Every day | ORAL | Status: DC

## 2012-12-20 NOTE — Telephone Encounter (Signed)
Refilled protonix #30 with 5 refills

## 2012-12-22 ENCOUNTER — Encounter: Payer: Self-pay | Admitting: Internal Medicine

## 2012-12-22 ENCOUNTER — Ambulatory Visit (INDEPENDENT_AMBULATORY_CARE_PROVIDER_SITE_OTHER): Payer: Medicare Other | Admitting: Internal Medicine

## 2012-12-22 VITALS — BP 120/70 | HR 89 | Temp 98.1°F | Ht 64.5 in | Wt 174.8 lb

## 2012-12-22 DIAGNOSIS — E7800 Pure hypercholesterolemia, unspecified: Secondary | ICD-10-CM

## 2012-12-22 DIAGNOSIS — G8929 Other chronic pain: Secondary | ICD-10-CM

## 2012-12-22 DIAGNOSIS — K219 Gastro-esophageal reflux disease without esophagitis: Secondary | ICD-10-CM | POA: Diagnosis not present

## 2012-12-22 DIAGNOSIS — I10 Essential (primary) hypertension: Secondary | ICD-10-CM

## 2012-12-22 DIAGNOSIS — M549 Dorsalgia, unspecified: Secondary | ICD-10-CM

## 2012-12-22 DIAGNOSIS — E119 Type 2 diabetes mellitus without complications: Secondary | ICD-10-CM

## 2012-12-22 MED ORDER — PANTOPRAZOLE SODIUM 40 MG PO TBEC: 40.0000 mg | DELAYED_RELEASE_TABLET | Freq: Two times a day (BID) | ORAL | Status: DC

## 2012-12-22 MED ORDER — NYSTATIN 100000 UNIT/GM EX CREA: TOPICAL_CREAM | Freq: Two times a day (BID) | CUTANEOUS | Status: DC

## 2012-12-24 DIAGNOSIS — M6530 Trigger finger, unspecified finger: Secondary | ICD-10-CM | POA: Diagnosis not present

## 2012-12-24 DIAGNOSIS — M19049 Primary osteoarthritis, unspecified hand: Secondary | ICD-10-CM | POA: Diagnosis not present

## 2012-12-29 DIAGNOSIS — E119 Type 2 diabetes mellitus without complications: Secondary | ICD-10-CM | POA: Diagnosis not present

## 2012-12-29 DIAGNOSIS — Z170 Estrogen receptor positive status [ER+]: Secondary | ICD-10-CM | POA: Diagnosis not present

## 2012-12-29 DIAGNOSIS — C50419 Malignant neoplasm of upper-outer quadrant of unspecified female breast: Secondary | ICD-10-CM | POA: Diagnosis not present

## 2012-12-29 DIAGNOSIS — Z79899 Other long term (current) drug therapy: Secondary | ICD-10-CM | POA: Diagnosis not present

## 2012-12-29 DIAGNOSIS — C50919 Malignant neoplasm of unspecified site of unspecified female breast: Secondary | ICD-10-CM | POA: Diagnosis not present

## 2013-01-06 DIAGNOSIS — C50919 Malignant neoplasm of unspecified site of unspecified female breast: Secondary | ICD-10-CM | POA: Diagnosis not present

## 2013-01-21 DIAGNOSIS — M545 Low back pain: Secondary | ICD-10-CM | POA: Diagnosis not present

## 2013-01-21 DIAGNOSIS — C50919 Malignant neoplasm of unspecified site of unspecified female breast: Secondary | ICD-10-CM | POA: Diagnosis not present

## 2013-02-02 ENCOUNTER — Encounter: Payer: Self-pay | Admitting: Internal Medicine

## 2013-02-02 DIAGNOSIS — C50919 Malignant neoplasm of unspecified site of unspecified female breast: Secondary | ICD-10-CM | POA: Insufficient documentation

## 2013-02-02 NOTE — Assessment & Plan Note (Signed)
Blood pressure under good control.  Same medication regimen.  Follow metabolic panel.   

## 2013-02-02 NOTE — Progress Notes (Signed)
Subjective:    Patient ID: Sherry Simon, female    DOB: 1945-12-09, 67 y.o.   MRN: [id number]  Urinary Tract Infection   67 year old female with past history of tachycardia, palpitations, anxiety/depression, diabetes, hypertension and pericarditis of unknown origin.  She comes in today for a scheduled follow up. Increased stress with her husband's medical issues.  She feels she is handling things relatively well.  She has also recently been diagnosed with breast cancer.  Being followed at [facility].  Feels she is handling this well.  Has a rotator cuff tear.  Has been seeing Dr Yves Dill.  Had an injection.  Helped some.  States her sugars have been doing well.  Brought in no recorded sugar readings.  Blood pressure has been doing well.  Has noticed some increased acid reflux.  Taking protonix.      Past Medical History  Diagnosis Date  . Hypertension   . Diabetes mellitus   . Hypercholesterolemia   . Endometriosis     requiring hysterectomy  . Nephrolithiasis   . Anxiety and depression   . Pericarditis     recurrent, unkown origin  . Tachycardia   . Depression   . History of chicken pox   . Colon polyps     Current Outpatient Prescriptions on File Prior to Visit  Medication Sig Dispense Refill  . atorvastatin (LIPITOR) 40 MG tablet Take 1 tablet (40 mg total) by mouth daily.  90 tablet  1  . brimonidine (ALPHAGAN) 0.15 % ophthalmic solution 1 drop 2 (two) times daily.      Marland Kitchen gabapentin (NEURONTIN) 300 MG capsule 300 mg. Take 3 capsules q hs      . HYDROcodone-acetaminophen (NORCO/VICODIN) 5-325 MG per tablet Take 1 tablet by mouth as needed.      Marland Kitchen losartan (COZAAR) 100 MG tablet Take 1 tablet (100 mg total) by mouth daily.  30 tablet  5  . metFORMIN (GLUCOPHAGE) 500 MG tablet 500 mg. Take 2 tablets q am and 1 q pm      . venlafaxine XR (EFFEXOR-XR) 150 MG 24 hr capsule Take 1 capsule (150 mg total) by mouth daily.  30 capsule  3  . aspirin 81 MG tablet Take 81 mg by mouth daily.       . cefUROXime (CEFTIN) 250 MG tablet Take 1 tablet (250 mg total) by mouth 2 (two) times daily.  14 tablet  0  . estradiol (CLIMARA - DOSED IN MG/24 HR) 0.1 mg/24hr Place 1 patch onto the skin once a week.      . nitrofurantoin, macrocrystal-monohydrate, (MACROBID) 100 MG capsule Take 1 capsule (100 mg total) by mouth 2 (two) times daily.  10 capsule  0  . nystatin cream (MYCOSTATIN) Apply topically 2 (two) times daily.  30 g  0   No current facility-administered medications on file prior to visit.    Review of Systems Patient denies any headache, lightheadedness or dizziness.  No significant sinus or allergy symptoms.  No chest pain, tightness or palpitations.  No increased shortness of breath, cough or congestion.  Breathing stable.  No nausea or vomiting.  Increased acid reflux.  No abdominal pain or cramping.  No bowel change, such as diarrhea, constipation, BRBPR or melana.  Still some perivaginal irritation.          Objective:   Physical Exam  Filed Vitals:   12/22/12 1117  BP: 120/70  Pulse: 89  Temp: 98.1 F (36.7 C)   Blood pressure recheck:  132/78, pulse 4  67 year old female in no acute distress.   HEENT:  Nares- clear.  Oropharynx - without lesions. NECK:  Supple.  Nontender.  No audible bruit.  HEART:  Appears to be regular. LUNGS:  No crackles or wheezing audible.  Respirations even and unlabored.  RADIAL PULSE:  Equal bilaterally.  CHEST:  Tenderness to palpation over the anterior chest wall.  Reproducible pain.  ABDOMEN:  Soft, nontender.  Bowel sounds present and normal.  No audible abdominal bruit.  EXTREMITIES:  No increased edema present.  DP pulses palpable and equal bilaterally.          Assessment & Plan:  GU.  Perivaginal irritation.  Nystatin helped.  Continue.  Follow.    PREVIOUS UTI.  Urinary symptoms resolved.  Follow.      CARDIOVASCULAR.  ECHO 05/23/10 revealed EF 60% with mild left atrial enlargement and moderate mitral insufficiency and mild  tricuspid insufficiency.  Sees Dr Okey Dupre [facility] cardiology).  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  Currently asymptomatic.     PULMONARY.  Breathing stable.  Follow.     INCREASED PSYCHOSOCIAL STRESSORS.  Increased stress with her husbands medical issues.  Increased stress with her chronic pain.  On effexor.  She feels she is handling things relatively well.  Follow.    GI.  Colonoscopy 07/24/10 revealed diverticulosis and removal of a polyp (ascending colon) - tubular adenomatous polyp.  Recommended follow up colonoscopy five years.    HEALTH MAINTENANCE.  Physical last visit.   Is s/p hysterectomy and does not require yearly pap smears.  Colonoscopy as outlined.  Mammogram 08/20/11 - negative.  Mammogram recently performed and revealed abnormal findings.  Diagnosed with breast cancer.  Being followed at [facility].

## 2013-02-02 NOTE — Assessment & Plan Note (Signed)
Brought in no sugar readings.  States under good control.  Follow met b and a1c.

## 2013-02-02 NOTE — Assessment & Plan Note (Signed)
On protonix. Increased reflux.  Increase protonix to 40mg  bid.  Follow. If persistent symptoms, will need GI referral.

## 2013-02-02 NOTE — Assessment & Plan Note (Signed)
Recently diagnosed.  Is being followed at [facility].

## 2013-02-02 NOTE — Assessment & Plan Note (Signed)
On lipitor.  Low cholesterol diet and exercise.   Follow lipid panel and liver function.   

## 2013-02-02 NOTE — Assessment & Plan Note (Signed)
Has been seeing Dr Chasnis.  Stable.  

## 2013-02-04 DIAGNOSIS — C50919 Malignant neoplasm of unspecified site of unspecified female breast: Secondary | ICD-10-CM | POA: Diagnosis not present

## 2013-02-05 DIAGNOSIS — M51379 Other intervertebral disc degeneration, lumbosacral region without mention of lumbar back pain or lower extremity pain: Secondary | ICD-10-CM | POA: Diagnosis not present

## 2013-02-05 DIAGNOSIS — M5137 Other intervertebral disc degeneration, lumbosacral region: Secondary | ICD-10-CM | POA: Diagnosis not present

## 2013-02-05 DIAGNOSIS — C50919 Malignant neoplasm of unspecified site of unspecified female breast: Secondary | ICD-10-CM | POA: Diagnosis not present

## 2013-02-08 DIAGNOSIS — M47817 Spondylosis without myelopathy or radiculopathy, lumbosacral region: Secondary | ICD-10-CM | POA: Diagnosis not present

## 2013-02-08 DIAGNOSIS — M545 Low back pain, unspecified: Secondary | ICD-10-CM | POA: Diagnosis not present

## 2013-02-08 DIAGNOSIS — M549 Dorsalgia, unspecified: Secondary | ICD-10-CM | POA: Diagnosis not present

## 2013-02-08 DIAGNOSIS — E119 Type 2 diabetes mellitus without complications: Secondary | ICD-10-CM | POA: Diagnosis not present

## 2013-02-08 DIAGNOSIS — C50919 Malignant neoplasm of unspecified site of unspecified female breast: Secondary | ICD-10-CM | POA: Diagnosis not present

## 2013-02-08 DIAGNOSIS — Z79899 Other long term (current) drug therapy: Secondary | ICD-10-CM | POA: Diagnosis not present

## 2013-02-09 DIAGNOSIS — Z170 Estrogen receptor positive status [ER+]: Secondary | ICD-10-CM | POA: Diagnosis not present

## 2013-02-09 DIAGNOSIS — C50919 Malignant neoplasm of unspecified site of unspecified female breast: Secondary | ICD-10-CM | POA: Diagnosis not present

## 2013-02-10 DIAGNOSIS — M25519 Pain in unspecified shoulder: Secondary | ICD-10-CM | POA: Diagnosis not present

## 2013-02-17 DIAGNOSIS — C50919 Malignant neoplasm of unspecified site of unspecified female breast: Secondary | ICD-10-CM | POA: Diagnosis not present

## 2013-02-17 DIAGNOSIS — Z170 Estrogen receptor positive status [ER+]: Secondary | ICD-10-CM | POA: Diagnosis not present

## 2013-02-18 DIAGNOSIS — C50919 Malignant neoplasm of unspecified site of unspecified female breast: Secondary | ICD-10-CM | POA: Diagnosis not present

## 2013-02-19 DIAGNOSIS — C50919 Malignant neoplasm of unspecified site of unspecified female breast: Secondary | ICD-10-CM | POA: Diagnosis not present

## 2013-02-20 DIAGNOSIS — Z510 Encounter for antineoplastic radiation therapy: Secondary | ICD-10-CM | POA: Diagnosis not present

## 2013-02-20 DIAGNOSIS — C50919 Malignant neoplasm of unspecified site of unspecified female breast: Secondary | ICD-10-CM | POA: Diagnosis not present

## 2013-02-20 DIAGNOSIS — Z170 Estrogen receptor positive status [ER+]: Secondary | ICD-10-CM | POA: Diagnosis not present

## 2013-02-23 DIAGNOSIS — C50919 Malignant neoplasm of unspecified site of unspecified female breast: Secondary | ICD-10-CM | POA: Diagnosis not present

## 2013-02-23 DIAGNOSIS — N951 Menopausal and female climacteric states: Secondary | ICD-10-CM | POA: Diagnosis not present

## 2013-02-24 DIAGNOSIS — N951 Menopausal and female climacteric states: Secondary | ICD-10-CM | POA: Diagnosis not present

## 2013-02-24 DIAGNOSIS — C50919 Malignant neoplasm of unspecified site of unspecified female breast: Secondary | ICD-10-CM | POA: Diagnosis not present

## 2013-02-25 DIAGNOSIS — C50919 Malignant neoplasm of unspecified site of unspecified female breast: Secondary | ICD-10-CM | POA: Diagnosis not present

## 2013-02-25 DIAGNOSIS — N951 Menopausal and female climacteric states: Secondary | ICD-10-CM | POA: Diagnosis not present

## 2013-02-26 DIAGNOSIS — C50919 Malignant neoplasm of unspecified site of unspecified female breast: Secondary | ICD-10-CM | POA: Diagnosis not present

## 2013-02-26 DIAGNOSIS — N951 Menopausal and female climacteric states: Secondary | ICD-10-CM | POA: Diagnosis not present

## 2013-02-27 DIAGNOSIS — N951 Menopausal and female climacteric states: Secondary | ICD-10-CM | POA: Diagnosis not present

## 2013-02-27 DIAGNOSIS — C50919 Malignant neoplasm of unspecified site of unspecified female breast: Secondary | ICD-10-CM | POA: Diagnosis not present

## 2013-03-02 DIAGNOSIS — N951 Menopausal and female climacteric states: Secondary | ICD-10-CM | POA: Diagnosis not present

## 2013-03-02 DIAGNOSIS — C50919 Malignant neoplasm of unspecified site of unspecified female breast: Secondary | ICD-10-CM | POA: Diagnosis not present

## 2013-03-03 ENCOUNTER — Encounter: Payer: Self-pay | Admitting: Internal Medicine

## 2013-03-03 DIAGNOSIS — C50919 Malignant neoplasm of unspecified site of unspecified female breast: Secondary | ICD-10-CM | POA: Diagnosis not present

## 2013-03-03 DIAGNOSIS — Z510 Encounter for antineoplastic radiation therapy: Secondary | ICD-10-CM | POA: Diagnosis not present

## 2013-03-04 DIAGNOSIS — N951 Menopausal and female climacteric states: Secondary | ICD-10-CM | POA: Diagnosis not present

## 2013-03-04 DIAGNOSIS — C50919 Malignant neoplasm of unspecified site of unspecified female breast: Secondary | ICD-10-CM | POA: Diagnosis not present

## 2013-03-05 DIAGNOSIS — N951 Menopausal and female climacteric states: Secondary | ICD-10-CM | POA: Diagnosis not present

## 2013-03-05 DIAGNOSIS — C50919 Malignant neoplasm of unspecified site of unspecified female breast: Secondary | ICD-10-CM | POA: Diagnosis not present

## 2013-03-06 DIAGNOSIS — N951 Menopausal and female climacteric states: Secondary | ICD-10-CM | POA: Diagnosis not present

## 2013-03-06 DIAGNOSIS — C50919 Malignant neoplasm of unspecified site of unspecified female breast: Secondary | ICD-10-CM | POA: Diagnosis not present

## 2013-03-09 ENCOUNTER — Telehealth: Payer: Self-pay | Admitting: Internal Medicine

## 2013-03-09 DIAGNOSIS — N951 Menopausal and female climacteric states: Secondary | ICD-10-CM | POA: Diagnosis not present

## 2013-03-09 DIAGNOSIS — C50919 Malignant neoplasm of unspecified site of unspecified female breast: Secondary | ICD-10-CM | POA: Diagnosis not present

## 2013-03-09 NOTE — Telephone Encounter (Signed)
Spoke with pharmacy & confirmed that refills are still on file. Pt aware

## 2013-03-09 NOTE — Telephone Encounter (Signed)
pantoprazole (PROTONIX) 40 MG tablet   Take 1 tablet (40 mg total) by mouth 2 (two) times daily.  Patient needing ASAP she is out of this medication

## 2013-03-10 ENCOUNTER — Telehealth: Payer: Self-pay | Admitting: Internal Medicine

## 2013-03-10 DIAGNOSIS — C50919 Malignant neoplasm of unspecified site of unspecified female breast: Secondary | ICD-10-CM | POA: Diagnosis not present

## 2013-03-10 DIAGNOSIS — N951 Menopausal and female climacteric states: Secondary | ICD-10-CM | POA: Diagnosis not present

## 2013-03-10 NOTE — Telephone Encounter (Signed)
Patient Information:  Caller Name: [name]  Phone: [phone]  Patient: Sherry Simon  Gender: Female  DOB: 26-Apr-1946  Age: 67 Years  PCP: Dale Providence  Office Follow Up:  Does the office need to follow up with this patient?: Yes  Instructions For The Office: Info to office for provider review/Rx/callback  krs/can  RN Note:  Patient taking radiation treatments.  Suffers from breast cancer and has stopped her hormone patches.  States has been having > 15 hot flashes qd, and wants to know if Effexor can be increased, or switched out to Lexapro to see if would mitigate the hot flashes.  Per menopause protocol/cecc, emergent symptoms denied; info to office for provider review/Rx/callback.  Patient states has been unable to schedule appt with Dr. Lorin Picket to discuss this.  [facility]  May reach patient at [phone].  krs/can  Symptoms  Reason For Call & Symptoms: hot flashes;  Reviewed Health History In EMR: Yes  Reviewed Medications In EMR: Yes  Reviewed Allergies In EMR: Yes  Reviewed Surgeries / Procedures: Yes  Date of Onset of Symptoms: Unknown  Guideline(s) Used:  No Protocol Available - Sick Adult  Disposition Per Guideline:   Discuss with PCP and Callback by Nurse Today  Reason For Disposition Reached:   Nursing judgment  Advice Given:  N/A  Patient Will Follow Care Advice:  YES

## 2013-03-10 NOTE — Telephone Encounter (Signed)
Per DR. Scott, increasing the Effexor would not make a big difference & neither would the Lexapro. She would like for her to start on Vitamin E 400 or 800 IU. Pt informed to try the Vitamin E for at least a week & call back with a update.

## 2013-03-10 NOTE — Telephone Encounter (Signed)
Waiting for Dr. Lorin Picket to call me & I will discuss this with her

## 2013-03-11 DIAGNOSIS — N951 Menopausal and female climacteric states: Secondary | ICD-10-CM | POA: Diagnosis not present

## 2013-03-11 DIAGNOSIS — C50919 Malignant neoplasm of unspecified site of unspecified female breast: Secondary | ICD-10-CM | POA: Diagnosis not present

## 2013-03-12 DIAGNOSIS — C50919 Malignant neoplasm of unspecified site of unspecified female breast: Secondary | ICD-10-CM | POA: Diagnosis not present

## 2013-03-12 DIAGNOSIS — N951 Menopausal and female climacteric states: Secondary | ICD-10-CM | POA: Diagnosis not present

## 2013-03-13 DIAGNOSIS — N951 Menopausal and female climacteric states: Secondary | ICD-10-CM | POA: Diagnosis not present

## 2013-03-13 DIAGNOSIS — C50919 Malignant neoplasm of unspecified site of unspecified female breast: Secondary | ICD-10-CM | POA: Diagnosis not present

## 2013-03-16 DIAGNOSIS — N951 Menopausal and female climacteric states: Secondary | ICD-10-CM | POA: Diagnosis not present

## 2013-03-16 DIAGNOSIS — C50919 Malignant neoplasm of unspecified site of unspecified female breast: Secondary | ICD-10-CM | POA: Diagnosis not present

## 2013-03-17 DIAGNOSIS — C50919 Malignant neoplasm of unspecified site of unspecified female breast: Secondary | ICD-10-CM | POA: Diagnosis not present

## 2013-03-17 DIAGNOSIS — N951 Menopausal and female climacteric states: Secondary | ICD-10-CM | POA: Diagnosis not present

## 2013-03-18 DIAGNOSIS — C50919 Malignant neoplasm of unspecified site of unspecified female breast: Secondary | ICD-10-CM | POA: Diagnosis not present

## 2013-03-18 DIAGNOSIS — N951 Menopausal and female climacteric states: Secondary | ICD-10-CM | POA: Diagnosis not present

## 2013-03-20 DIAGNOSIS — H4420 Degenerative myopia, unspecified eye: Secondary | ICD-10-CM | POA: Diagnosis not present

## 2013-03-20 DIAGNOSIS — E119 Type 2 diabetes mellitus without complications: Secondary | ICD-10-CM | POA: Diagnosis not present

## 2013-03-20 DIAGNOSIS — H3320 Serous retinal detachment, unspecified eye: Secondary | ICD-10-CM | POA: Diagnosis not present

## 2013-03-20 DIAGNOSIS — H3530 Unspecified macular degeneration: Secondary | ICD-10-CM | POA: Diagnosis not present

## 2013-03-30 DIAGNOSIS — G8929 Other chronic pain: Secondary | ICD-10-CM | POA: Diagnosis not present

## 2013-03-30 DIAGNOSIS — H409 Unspecified glaucoma: Secondary | ICD-10-CM | POA: Diagnosis not present

## 2013-03-30 DIAGNOSIS — M549 Dorsalgia, unspecified: Secondary | ICD-10-CM | POA: Diagnosis not present

## 2013-03-30 DIAGNOSIS — E119 Type 2 diabetes mellitus without complications: Secondary | ICD-10-CM | POA: Diagnosis not present

## 2013-03-30 DIAGNOSIS — N951 Menopausal and female climacteric states: Secondary | ICD-10-CM | POA: Diagnosis not present

## 2013-03-30 DIAGNOSIS — R61 Generalized hyperhidrosis: Secondary | ICD-10-CM | POA: Diagnosis not present

## 2013-03-30 DIAGNOSIS — K219 Gastro-esophageal reflux disease without esophagitis: Secondary | ICD-10-CM | POA: Diagnosis not present

## 2013-03-30 DIAGNOSIS — C50919 Malignant neoplasm of unspecified site of unspecified female breast: Secondary | ICD-10-CM | POA: Diagnosis not present

## 2013-04-02 DIAGNOSIS — H409 Unspecified glaucoma: Secondary | ICD-10-CM | POA: Diagnosis not present

## 2013-04-02 DIAGNOSIS — H4050X0 Glaucoma secondary to other eye disorders, unspecified eye, stage unspecified: Secondary | ICD-10-CM | POA: Diagnosis not present

## 2013-04-22 ENCOUNTER — Other Ambulatory Visit (INDEPENDENT_AMBULATORY_CARE_PROVIDER_SITE_OTHER): Payer: Medicare Other

## 2013-04-22 DIAGNOSIS — I10 Essential (primary) hypertension: Secondary | ICD-10-CM

## 2013-04-22 DIAGNOSIS — E7800 Pure hypercholesterolemia, unspecified: Secondary | ICD-10-CM | POA: Diagnosis not present

## 2013-04-22 DIAGNOSIS — E119 Type 2 diabetes mellitus without complications: Secondary | ICD-10-CM | POA: Diagnosis not present

## 2013-04-22 LAB — HEPATIC FUNCTION PANEL
AST: 14 U/L (ref 0–37)
Alkaline Phosphatase: 78 U/L (ref 39–117)
Bilirubin, Direct: 0.2 mg/dL (ref 0.0–0.3)
Total Bilirubin: 1 mg/dL (ref 0.3–1.2)

## 2013-04-22 LAB — LIPID PANEL: Total CHOL/HDL Ratio: 3

## 2013-04-22 LAB — BASIC METABOLIC PANEL
BUN: 11 mg/dL (ref 6–23)
CO2: 27 mEq/L (ref 19–32)
Calcium: 10 mg/dL (ref 8.4–10.5)
Creatinine, Ser: 0.6 mg/dL (ref 0.4–1.2)
GFR: 100.14 mL/min (ref >60.00)
Glucose, Bld: 106 mg/dL — ABNORMAL HIGH (ref 70–99)

## 2013-04-22 LAB — HEMOGLOBIN A1C: Hgb A1c MFr Bld: 6.6 % — ABNORMAL HIGH (ref 4.6–6.5)

## 2013-04-27 ENCOUNTER — Encounter: Payer: Self-pay | Admitting: Internal Medicine

## 2013-04-27 ENCOUNTER — Ambulatory Visit (INDEPENDENT_AMBULATORY_CARE_PROVIDER_SITE_OTHER): Payer: Medicare Other | Admitting: Internal Medicine

## 2013-04-27 VITALS — BP 112/78 | HR 96 | Temp 98.2°F | Ht 64.5 in | Wt 173.5 lb

## 2013-04-27 DIAGNOSIS — M549 Dorsalgia, unspecified: Secondary | ICD-10-CM | POA: Diagnosis not present

## 2013-04-27 DIAGNOSIS — K219 Gastro-esophageal reflux disease without esophagitis: Secondary | ICD-10-CM

## 2013-04-27 DIAGNOSIS — E7800 Pure hypercholesterolemia, unspecified: Secondary | ICD-10-CM | POA: Diagnosis not present

## 2013-04-27 DIAGNOSIS — I10 Essential (primary) hypertension: Secondary | ICD-10-CM

## 2013-04-27 DIAGNOSIS — E119 Type 2 diabetes mellitus without complications: Secondary | ICD-10-CM

## 2013-04-27 DIAGNOSIS — G8929 Other chronic pain: Secondary | ICD-10-CM | POA: Diagnosis not present

## 2013-04-28 ENCOUNTER — Encounter: Payer: Self-pay | Admitting: Internal Medicine

## 2013-04-28 NOTE — Assessment & Plan Note (Signed)
Has been seeing Dr Chasnis.  Stable.  

## 2013-04-28 NOTE — Assessment & Plan Note (Signed)
Brought in no sugar readings.  States under good control.  A1c just checked 04/22/13 - 6.6.  Eye exam 7/14.

## 2013-04-28 NOTE — Assessment & Plan Note (Signed)
Recently diagnosed.  Is being followed at [facility].  S/p XRT.    

## 2013-04-28 NOTE — Assessment & Plan Note (Signed)
Blood pressure under good control.  Same medication regimen.  Follow metabolic panel.   

## 2013-04-28 NOTE — Progress Notes (Signed)
Subjective:    Patient ID: Sherry Simon, female    DOB: 25-Nov-1945, 67 y.o.   MRN: [id number]  HPI 67 year old female with past history of tachycardia, palpitations, anxiety/depression, diabetes, hypertension and pericarditis of unknown origin.  She comes in today for a scheduled follow up.  Has been seeing Dr Yves Dill.  S/p injections.  Helped some.   Has two torn rotator cuffs.  Takes vicodin and neurontin.  Has recently been diagnosed with breast cancer.  Is s/p XRT.  Reports increased fatigue.  Off estrogen.  Having increased hot flashes.  Tapering off the Effexor.  Wellbutrin added.  Has received accupuncture for hot flashes.  May have helped some.  Cannot afford continued treatments.  Increased stress with her husbands medical issues as well.     Past Medical History  Diagnosis Date  . Hypertension   . Diabetes mellitus   . Hypercholesterolemia   . Endometriosis     requiring hysterectomy  . Nephrolithiasis   . Anxiety and depression   . Pericarditis     recurrent, unkown origin  . Tachycardia   . Depression   . History of chicken pox   . Colon polyps     Current Outpatient Prescriptions on File Prior to Visit  Medication Sig Dispense Refill  . aspirin 81 MG tablet Take 81 mg by mouth daily.      Marland Kitchen atorvastatin (LIPITOR) 40 MG tablet Take 1 tablet (40 mg total) by mouth daily.  90 tablet  1  . gabapentin (NEURONTIN) 300 MG capsule 300 mg. Take 3 capsules q hs      . HYDROcodone-acetaminophen (NORCO/VICODIN) 5-325 MG per tablet Take 1 tablet by mouth as needed.      Marland Kitchen losartan (COZAAR) 100 MG tablet Take 1 tablet (100 mg total) by mouth daily.  30 tablet  5  . metFORMIN (GLUCOPHAGE) 500 MG tablet Take 500 mg by mouth 2 (two) times daily with a meal. Take 2 tablets q am and 1 q pm      . pantoprazole (PROTONIX) 40 MG tablet Take 1 tablet (40 mg total) by mouth 2 (two) times daily.  60 tablet  5   No current facility-administered medications on file prior to visit.     Review of Systems Patient denies any headache, lightheadedness or dizziness.  No significant sinus or allergy symptoms.  No chest pain, tightness or palpitations.  No increased shortness of breath, cough or congestion.  Breathing stable.  No nausea or vomiting.  No abdominal pain or cramping.  No bowel change, such as diarrhea, constipation, BRBPR or melana.  No urine change.  Increased stress as outlined.  Increased fatigue.  Hot flashes.         Objective:   Physical Exam  Filed Vitals:   04/27/13 1048  BP: 112/78  Pulse: 96  Temp: 98.2 F (36.8 C)   Pulse recheck:  49  67 year old female in no acute distress.   HEENT:  Nares- clear.  Oropharynx - without lesions. NECK:  Supple.  Nontender.  No audible bruit.  HEART:  Appears to be regular. LUNGS:  No crackles or wheezing audible.  Respirations even and unlabored.  RADIAL PULSE:  Equal bilaterally.  ABDOMEN:  Soft, nontender.  Bowel sounds present and normal.  No audible abdominal bruit.  EXTREMITIES:  No increased edema present.  DP pulses palpable and equal bilaterally.   FOOT EXAM:  No lesions         Assessment & Plan:  CARDIOVASCULAR.  ECHO 05/23/10 revealed EF 60% with mild left atrial enlargement and moderate mitral insufficiency and mild tricuspid insufficiency.  Sees Dr Okey Dupre [facility] cardiology).  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  Currently asymptomatic.    PULMONARY.  Breathing stable.  Follow.   FATIGUE.  Probably multifactorial.  S/p XRT.  Follow.   INCREASED PSYCHOSOCIAL STRESSORS.  Increased stress with her husbands medical issues.  Increased stress with her chronic pain and current fatigue s/p breast cancer treatment.  Effexor being tapered.  On Wellbutrin.  Follow closely.  Does not feel she needs any further intervention at this time.    GI.  Colonoscopy 07/24/10 revealed diverticulosis and removal of a polyp (ascending colon) - tubular adenomatous polyp.  Recommended follow  up colonoscopy five years.    HEALTH MAINTENANCE.  Physical 10/13/12.  Is s/p hysterectomy and does not require yearly pap smears.  Colonoscopy as outlined.  Being followed by oncology for her breast cancer.

## 2013-04-28 NOTE — Assessment & Plan Note (Signed)
On lipitor.  Low cholesterol diet and exercise.  Follow lipid panel and liver function.  Lipid panel 04/22/13 - wnl.   

## 2013-04-28 NOTE — Assessment & Plan Note (Signed)
On protonix.  No reflux reported today.  Follow.   

## 2013-04-29 ENCOUNTER — Other Ambulatory Visit: Payer: Self-pay

## 2013-04-29 ENCOUNTER — Encounter: Payer: Self-pay | Admitting: *Deleted

## 2013-05-11 NOTE — Telephone Encounter (Signed)
Mailed unread message to pt  

## 2013-05-14 DIAGNOSIS — M25519 Pain in unspecified shoulder: Secondary | ICD-10-CM | POA: Diagnosis not present

## 2013-05-20 DIAGNOSIS — M19049 Primary osteoarthritis, unspecified hand: Secondary | ICD-10-CM | POA: Diagnosis not present

## 2013-05-20 DIAGNOSIS — M6530 Trigger finger, unspecified finger: Secondary | ICD-10-CM | POA: Diagnosis not present

## 2013-05-26 ENCOUNTER — Other Ambulatory Visit: Payer: Self-pay | Admitting: *Deleted

## 2013-05-26 MED ORDER — LOSARTAN POTASSIUM 100 MG PO TABS: 100.0000 mg | ORAL_TABLET | Freq: Every day | ORAL | Status: DC

## 2013-06-15 DIAGNOSIS — Z23 Encounter for immunization: Secondary | ICD-10-CM | POA: Diagnosis not present

## 2013-07-01 ENCOUNTER — Ambulatory Visit (INDEPENDENT_AMBULATORY_CARE_PROVIDER_SITE_OTHER): Payer: Medicare Other | Admitting: Internal Medicine

## 2013-07-01 ENCOUNTER — Encounter: Payer: Self-pay | Admitting: Internal Medicine

## 2013-07-01 VITALS — BP 122/78 | HR 86 | Temp 98.3°F | Ht 64.5 in | Wt 167.2 lb

## 2013-07-01 DIAGNOSIS — I10 Essential (primary) hypertension: Secondary | ICD-10-CM | POA: Diagnosis not present

## 2013-07-01 DIAGNOSIS — K219 Gastro-esophageal reflux disease without esophagitis: Secondary | ICD-10-CM

## 2013-07-01 DIAGNOSIS — R0602 Shortness of breath: Secondary | ICD-10-CM | POA: Diagnosis not present

## 2013-07-01 DIAGNOSIS — C50919 Malignant neoplasm of unspecified site of unspecified female breast: Secondary | ICD-10-CM

## 2013-07-01 DIAGNOSIS — E7800 Pure hypercholesterolemia, unspecified: Secondary | ICD-10-CM | POA: Diagnosis not present

## 2013-07-01 DIAGNOSIS — M549 Dorsalgia, unspecified: Secondary | ICD-10-CM

## 2013-07-01 DIAGNOSIS — E119 Type 2 diabetes mellitus without complications: Secondary | ICD-10-CM | POA: Diagnosis not present

## 2013-07-01 DIAGNOSIS — G8929 Other chronic pain: Secondary | ICD-10-CM

## 2013-07-01 DIAGNOSIS — Z86010 Personal history of colonic polyps: Secondary | ICD-10-CM

## 2013-07-05 ENCOUNTER — Encounter: Payer: Self-pay | Admitting: Internal Medicine

## 2013-07-05 DIAGNOSIS — Z86010 Personal history of colonic polyps: Secondary | ICD-10-CM | POA: Insufficient documentation

## 2013-07-05 NOTE — Assessment & Plan Note (Signed)
Brought in no sugar readings.  States under good control as outlined.   A1c just checked 04/22/13 - 6.6.  Eye exam 7/14.     

## 2013-07-05 NOTE — Assessment & Plan Note (Signed)
Recently diagnosed.  Is being followed at [facility].  S/p XRT.    

## 2013-07-05 NOTE — Assessment & Plan Note (Signed)
Colonoscopy as outlined.  Follow.  Bowels stable.    

## 2013-07-05 NOTE — Assessment & Plan Note (Signed)
Blood pressure under good control.  Same medication regimen.  Follow metabolic panel.   

## 2013-07-05 NOTE — Progress Notes (Signed)
Subjective:    Patient ID: Sherry Simon, female    DOB: 1946/06/05, 67 y.o.   MRN: [id number]  HPI 67 year old female with past history of tachycardia, palpitations, anxiety/depression, diabetes, hypertension and pericarditis of unknown origin.  She comes in today for a scheduled follow up.  Has been seeing Dr Yves Dill.  S/p injections.  Helped some.   Has two torn rotator cuffs.  Takes vicodin and neurontin.  Has recently been diagnosed with breast cancer.  Is s/p XRT.  Has f/u with surgery and oncology today.  Reports increased fatigue.  Off estrogen.  Has stopped the wellbutrin.  On effexor now.  Increased stress with her husbands medical issues.  Discussed this at length.  She is planning to get home health involved.  Her children are now aware of the situation.  Feels this will help as well.  She states her sugars have been doing well.  AM sugars averaging low 120s and pm sugars <150.     Past Medical History  Diagnosis Date  . Hypertension   . Diabetes mellitus   . Hypercholesterolemia   . Endometriosis     requiring hysterectomy  . Nephrolithiasis   . Anxiety and depression   . Pericarditis     recurrent, unkown origin  . Tachycardia   . Depression   . History of chicken pox   . Colon polyps     Current Outpatient Prescriptions on File Prior to Visit  Medication Sig Dispense Refill  . anastrozole (ARIMIDEX) 1 MG tablet Take 1 mg by mouth daily.      Marland Kitchen aspirin 81 MG tablet Take 81 mg by mouth daily.      Marland Kitchen atorvastatin (LIPITOR) 40 MG tablet Take 1 tablet (40 mg total) by mouth daily.  90 tablet  1  . gabapentin (NEURONTIN) 300 MG capsule 300 mg. Take 3 capsules q hs      . HYDROcodone-acetaminophen (NORCO/VICODIN) 5-325 MG per tablet Take 1 tablet by mouth as needed.      . latanoprost (XALATAN) 0.005 % ophthalmic solution Place 1 drop into the left eye at bedtime.      Marland Kitchen losartan (COZAAR) 100 MG tablet Take 1 tablet (100 mg total) by mouth daily.  30 tablet  5  .  metFORMIN (GLUCOPHAGE) 500 MG tablet Take 500 mg by mouth 2 (two) times daily with a meal.       . pantoprazole (PROTONIX) 40 MG tablet Take 1 tablet (40 mg total) by mouth 2 (two) times daily.  60 tablet  5  . venlafaxine XR (EFFEXOR-XR) 37.5 MG 24 hr capsule Take 112.5 mg by mouth daily. Take 3 daily       No current facility-administered medications on file prior to visit.    Review of Systems Patient denies any headache, lightheadedness or dizziness.  No significant sinus or allergy symptoms.  No chest pain, tightness or palpitations.  No increased shortness of breath, cough or congestion.  Breathing stable.  No nausea or vomiting.  No abdominal pain or cramping.  No bowel change, such as diarrhea, constipation, BRBPR or melana.  No urine change.  Increased stress as outlined.  Desires no further intervention at this time.  Feels home health will improve some things.  Increased fatigue.  Sugars as outlined.       Objective:   Physical Exam  Filed Vitals:   07/01/13 1057  BP: 122/78  Pulse: 86  Temp: 98.3 F (36.8 C)   Pulse recheck:  76,  blood pressure recheck:  54/38  67 year old female in no acute distress.   HEENT:  Nares- clear.  Oropharynx - without lesions. NECK:  Supple.  Nontender.  No audible bruit.  HEART:  Appears to be regular. LUNGS:  No crackles or wheezing audible.  Respirations even and unlabored.  RADIAL PULSE:  Equal bilaterally.  ABDOMEN:  Soft, nontender.  Bowel sounds present and normal.  No audible abdominal bruit.  EXTREMITIES:  No increased edema present.  DP pulses palpable and equal bilaterally.   FOOT EXAM:  No lesions         Assessment & Plan:  CARDIOVASCULAR.  ECHO 05/23/10 revealed EF 60% with mild left atrial enlargement and moderate mitral insufficiency and mild tricuspid insufficiency.  Sees Dr Okey Dupre [facility] cardiology).  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  Currently asymptomatic.    PULMONARY.  Breathing stable.   Follow.   FATIGUE.  Probably multifactorial.  S/p XRT.  Follow.   INCREASED PSYCHOSOCIAL STRESSORS.  Increased stress with her husbands medical issues.  Increased stress with her chronic pain and current fatigue s/p breast cancer treatment.  She stopped wellbutrin.  On effexor.  Follow closely.  Does not feel she needs any further intervention at this time.    GI.  Colonoscopy 07/24/10 revealed diverticulosis and removal of a polyp (ascending colon) - tubular adenomatous polyp.  Recommended follow up colonoscopy five years.    HEALTH MAINTENANCE.  Physical 10/13/12.  Is s/p hysterectomy and does not require yearly pap smears.  Colonoscopy as outlined.  Being followed by oncology for her breast cancer.

## 2013-07-05 NOTE — Assessment & Plan Note (Signed)
On lipitor.  Low cholesterol diet and exercise.  Follow lipid panel and liver function.  Lipid panel 04/22/13 - wnl.   

## 2013-07-05 NOTE — Assessment & Plan Note (Signed)
Has been seeing Dr Chasnis.  Stable.  

## 2013-07-05 NOTE — Assessment & Plan Note (Signed)
On protonix.  No reflux reported today.  Follow.   

## 2013-07-10 ENCOUNTER — Other Ambulatory Visit: Payer: Self-pay | Admitting: Internal Medicine

## 2013-07-17 DIAGNOSIS — E079 Disorder of thyroid, unspecified: Secondary | ICD-10-CM | POA: Diagnosis not present

## 2013-07-17 DIAGNOSIS — M899 Disorder of bone, unspecified: Secondary | ICD-10-CM | POA: Diagnosis not present

## 2013-07-17 DIAGNOSIS — Z79899 Other long term (current) drug therapy: Secondary | ICD-10-CM | POA: Diagnosis not present

## 2013-07-17 DIAGNOSIS — Z9889 Other specified postprocedural states: Secondary | ICD-10-CM | POA: Diagnosis not present

## 2013-07-17 DIAGNOSIS — C50919 Malignant neoplasm of unspecified site of unspecified female breast: Secondary | ICD-10-CM | POA: Diagnosis not present

## 2013-07-27 DIAGNOSIS — M6530 Trigger finger, unspecified finger: Secondary | ICD-10-CM | POA: Diagnosis not present

## 2013-07-27 DIAGNOSIS — M19049 Primary osteoarthritis, unspecified hand: Secondary | ICD-10-CM | POA: Diagnosis not present

## 2013-07-27 DIAGNOSIS — Z01818 Encounter for other preprocedural examination: Secondary | ICD-10-CM | POA: Diagnosis not present

## 2013-07-29 DIAGNOSIS — R911 Solitary pulmonary nodule: Secondary | ICD-10-CM | POA: Diagnosis not present

## 2013-07-29 DIAGNOSIS — R6889 Other general symptoms and signs: Secondary | ICD-10-CM | POA: Diagnosis not present

## 2013-07-29 DIAGNOSIS — C50919 Malignant neoplasm of unspecified site of unspecified female breast: Secondary | ICD-10-CM | POA: Diagnosis not present

## 2013-08-01 DIAGNOSIS — R634 Abnormal weight loss: Secondary | ICD-10-CM | POA: Diagnosis not present

## 2013-08-01 DIAGNOSIS — H3530 Unspecified macular degeneration: Secondary | ICD-10-CM | POA: Diagnosis not present

## 2013-08-01 DIAGNOSIS — K219 Gastro-esophageal reflux disease without esophagitis: Secondary | ICD-10-CM | POA: Diagnosis not present

## 2013-08-01 DIAGNOSIS — E119 Type 2 diabetes mellitus without complications: Secondary | ICD-10-CM | POA: Diagnosis not present

## 2013-08-01 DIAGNOSIS — M79609 Pain in unspecified limb: Secondary | ICD-10-CM | POA: Diagnosis not present

## 2013-08-01 DIAGNOSIS — M6530 Trigger finger, unspecified finger: Secondary | ICD-10-CM | POA: Diagnosis not present

## 2013-08-01 DIAGNOSIS — H338 Other retinal detachments: Secondary | ICD-10-CM | POA: Diagnosis not present

## 2013-08-01 DIAGNOSIS — Z853 Personal history of malignant neoplasm of breast: Secondary | ICD-10-CM | POA: Diagnosis not present

## 2013-08-01 DIAGNOSIS — T753XXA Motion sickness, initial encounter: Secondary | ICD-10-CM | POA: Diagnosis not present

## 2013-08-01 DIAGNOSIS — I318 Other specified diseases of pericardium: Secondary | ICD-10-CM | POA: Diagnosis not present

## 2013-08-03 DIAGNOSIS — C50919 Malignant neoplasm of unspecified site of unspecified female breast: Secondary | ICD-10-CM | POA: Diagnosis not present

## 2013-08-10 DIAGNOSIS — E041 Nontoxic single thyroid nodule: Secondary | ICD-10-CM | POA: Diagnosis not present

## 2013-08-10 DIAGNOSIS — Z79899 Other long term (current) drug therapy: Secondary | ICD-10-CM | POA: Diagnosis not present

## 2013-08-24 DIAGNOSIS — E119 Type 2 diabetes mellitus without complications: Secondary | ICD-10-CM | POA: Diagnosis not present

## 2013-08-24 DIAGNOSIS — H4420 Degenerative myopia, unspecified eye: Secondary | ICD-10-CM | POA: Diagnosis not present

## 2013-08-24 DIAGNOSIS — H3320 Serous retinal detachment, unspecified eye: Secondary | ICD-10-CM | POA: Diagnosis not present

## 2013-08-25 DIAGNOSIS — M47817 Spondylosis without myelopathy or radiculopathy, lumbosacral region: Secondary | ICD-10-CM | POA: Diagnosis not present

## 2013-08-25 DIAGNOSIS — M25519 Pain in unspecified shoulder: Secondary | ICD-10-CM | POA: Diagnosis not present

## 2013-08-25 DIAGNOSIS — M5137 Other intervertebral disc degeneration, lumbosacral region: Secondary | ICD-10-CM | POA: Diagnosis not present

## 2013-08-25 DIAGNOSIS — IMO0002 Reserved for concepts with insufficient information to code with codable children: Secondary | ICD-10-CM | POA: Diagnosis not present

## 2013-08-27 DIAGNOSIS — Z1283 Encounter for screening for malignant neoplasm of skin: Secondary | ICD-10-CM | POA: Diagnosis not present

## 2013-08-27 DIAGNOSIS — L570 Actinic keratosis: Secondary | ICD-10-CM | POA: Diagnosis not present

## 2013-08-27 DIAGNOSIS — C44519 Basal cell carcinoma of skin of other part of trunk: Secondary | ICD-10-CM | POA: Diagnosis not present

## 2013-08-27 DIAGNOSIS — L738 Other specified follicular disorders: Secondary | ICD-10-CM | POA: Diagnosis not present

## 2013-08-27 DIAGNOSIS — D485 Neoplasm of uncertain behavior of skin: Secondary | ICD-10-CM | POA: Diagnosis not present

## 2013-10-01 DIAGNOSIS — H409 Unspecified glaucoma: Secondary | ICD-10-CM | POA: Diagnosis not present

## 2013-10-05 DIAGNOSIS — Z79899 Other long term (current) drug therapy: Secondary | ICD-10-CM | POA: Diagnosis not present

## 2013-10-05 DIAGNOSIS — R0602 Shortness of breath: Secondary | ICD-10-CM | POA: Diagnosis not present

## 2013-10-05 DIAGNOSIS — N951 Menopausal and female climacteric states: Secondary | ICD-10-CM | POA: Diagnosis not present

## 2013-10-05 DIAGNOSIS — C50919 Malignant neoplasm of unspecified site of unspecified female breast: Secondary | ICD-10-CM | POA: Diagnosis not present

## 2013-10-05 DIAGNOSIS — G8929 Other chronic pain: Secondary | ICD-10-CM | POA: Diagnosis not present

## 2013-10-05 DIAGNOSIS — Z170 Estrogen receptor positive status [ER+]: Secondary | ICD-10-CM | POA: Diagnosis not present

## 2013-10-05 DIAGNOSIS — M549 Dorsalgia, unspecified: Secondary | ICD-10-CM | POA: Diagnosis not present

## 2013-10-15 ENCOUNTER — Encounter: Payer: Self-pay | Admitting: Internal Medicine

## 2013-10-15 ENCOUNTER — Ambulatory Visit (INDEPENDENT_AMBULATORY_CARE_PROVIDER_SITE_OTHER): Payer: Medicare Other | Admitting: Internal Medicine

## 2013-10-15 VITALS — BP 110/60 | HR 104 | Temp 97.7°F | Ht 64.0 in | Wt 158.0 lb

## 2013-10-15 DIAGNOSIS — S43429A Sprain of unspecified rotator cuff capsule, initial encounter: Secondary | ICD-10-CM | POA: Diagnosis not present

## 2013-10-15 DIAGNOSIS — E119 Type 2 diabetes mellitus without complications: Secondary | ICD-10-CM

## 2013-10-15 DIAGNOSIS — C50919 Malignant neoplasm of unspecified site of unspecified female breast: Secondary | ICD-10-CM

## 2013-10-15 DIAGNOSIS — E7800 Pure hypercholesterolemia, unspecified: Secondary | ICD-10-CM

## 2013-10-15 DIAGNOSIS — Z860100 Personal history of colon polyps, unspecified: Secondary | ICD-10-CM

## 2013-10-15 DIAGNOSIS — G8929 Other chronic pain: Secondary | ICD-10-CM | POA: Diagnosis not present

## 2013-10-15 DIAGNOSIS — K219 Gastro-esophageal reflux disease without esophagitis: Secondary | ICD-10-CM

## 2013-10-15 DIAGNOSIS — M549 Dorsalgia, unspecified: Secondary | ICD-10-CM | POA: Diagnosis not present

## 2013-10-15 DIAGNOSIS — Z5189 Encounter for other specified aftercare: Secondary | ICD-10-CM | POA: Diagnosis not present

## 2013-10-15 DIAGNOSIS — E041 Nontoxic single thyroid nodule: Secondary | ICD-10-CM

## 2013-10-15 DIAGNOSIS — I10 Essential (primary) hypertension: Secondary | ICD-10-CM

## 2013-10-15 DIAGNOSIS — M75100 Unspecified rotator cuff tear or rupture of unspecified shoulder, not specified as traumatic: Secondary | ICD-10-CM

## 2013-10-15 MED ORDER — VENLAFAXINE HCL ER 150 MG PO CP24: 150.0000 mg | ORAL_CAPSULE | Freq: Every day | ORAL | Status: DC

## 2013-10-15 NOTE — Progress Notes (Signed)
Pre-visit discussion using our clinic review tool. No additional management support is needed unless otherwise documented below in the visit note.  

## 2013-10-16 ENCOUNTER — Emergency Department: Payer: Self-pay | Admitting: Emergency Medicine

## 2013-10-16 DIAGNOSIS — S46909A Unspecified injury of unspecified muscle, fascia and tendon at shoulder and upper arm level, unspecified arm, initial encounter: Secondary | ICD-10-CM | POA: Diagnosis not present

## 2013-10-16 DIAGNOSIS — IMO0002 Reserved for concepts with insufficient information to code with codable children: Secondary | ICD-10-CM | POA: Diagnosis not present

## 2013-10-16 DIAGNOSIS — I10 Essential (primary) hypertension: Secondary | ICD-10-CM | POA: Diagnosis not present

## 2013-10-16 DIAGNOSIS — E119 Type 2 diabetes mellitus without complications: Secondary | ICD-10-CM | POA: Diagnosis not present

## 2013-10-16 DIAGNOSIS — Z7982 Long term (current) use of aspirin: Secondary | ICD-10-CM | POA: Diagnosis not present

## 2013-10-16 DIAGNOSIS — S4980XA Other specified injuries of shoulder and upper arm, unspecified arm, initial encounter: Secondary | ICD-10-CM | POA: Diagnosis not present

## 2013-10-16 DIAGNOSIS — Z79899 Other long term (current) drug therapy: Secondary | ICD-10-CM | POA: Diagnosis not present

## 2013-10-16 DIAGNOSIS — M25519 Pain in unspecified shoulder: Secondary | ICD-10-CM | POA: Diagnosis not present

## 2013-10-16 LAB — CBC
HCT: 35.5 % (ref 35.0–47.0)
HGB: 11.8 g/dL — ABNORMAL LOW (ref 12.0–16.0)
MCH: 30.3 pg (ref 26.0–34.0)
MCHC: 33.2 g/dL (ref 32.0–36.0)
MCV: 91 fL (ref 80–100)
PLATELETS: 275 10*3/uL (ref 150–440)
RBC: 3.89 10*6/uL (ref 3.80–5.20)
RDW: 13.4 % (ref 11.5–14.5)
WBC: 4.5 10*3/uL (ref 3.6–11.0)

## 2013-10-16 LAB — COMPREHENSIVE METABOLIC PANEL
ALBUMIN: 3.7 g/dL (ref 3.4–5.0)
ALT: 22 U/L (ref 12–78)
Alkaline Phosphatase: 113 U/L
Anion Gap: 3 — ABNORMAL LOW (ref 7–16)
BUN: 16 mg/dL (ref 7–18)
Bilirubin,Total: 0.4 mg/dL (ref 0.2–1.0)
Calcium, Total: 9 mg/dL (ref 8.5–10.1)
Chloride: 106 mmol/L (ref 98–107)
Co2: 29 mmol/L (ref 21–32)
Creatinine: 0.66 mg/dL (ref 0.60–1.30)
EGFR (African American): >60
EGFR (Non-African Amer.): >60
GLUCOSE: 117 mg/dL — AB (ref 65–99)
Osmolality: 278 (ref 275–301)
Potassium: 4 mmol/L (ref 3.5–5.1)
SGOT(AST): 23 U/L (ref 15–37)
SODIUM: 138 mmol/L (ref 136–145)
TOTAL PROTEIN: 6.4 g/dL (ref 6.4–8.2)

## 2013-10-18 ENCOUNTER — Encounter: Payer: Self-pay | Admitting: Internal Medicine

## 2013-10-18 DIAGNOSIS — M75100 Unspecified rotator cuff tear or rupture of unspecified shoulder, not specified as traumatic: Secondary | ICD-10-CM | POA: Insufficient documentation

## 2013-10-18 DIAGNOSIS — E041 Nontoxic single thyroid nodule: Secondary | ICD-10-CM | POA: Insufficient documentation

## 2013-10-18 NOTE — Assessment & Plan Note (Signed)
On protonix.  No reflux reported today.  Follow.

## 2013-10-18 NOTE — Assessment & Plan Note (Signed)
Colonoscopy as outlined.  Follow.  Bowels stable.    

## 2013-10-18 NOTE — Assessment & Plan Note (Signed)
Right > left.  Planning to f/u with Dr Sharlet Salina.

## 2013-10-18 NOTE — Assessment & Plan Note (Signed)
Showed up on PET scan.  Biopsy negative.  Seeing endocrinology.  Per her report, thyroid function wnl.

## 2013-10-18 NOTE — Assessment & Plan Note (Signed)
Has been seeing Dr Chasnis.  Stable.  

## 2013-10-18 NOTE — Assessment & Plan Note (Signed)
On lipitor.  Low cholesterol diet and exercise.  Follow lipid panel and liver function.  Lipid panel 04/22/13 - wnl.

## 2013-10-18 NOTE — Progress Notes (Signed)
Subjective:    Patient ID: Sherry Simon, female    DOB: 20-Feb-1946, 68 y.o.   MRN: [id number]  HPI 68 year old female with past history of tachycardia, palpitations, anxiety/depression, diabetes, hypertension and pericarditis of unknown origin.  She comes in today for a follow up of these issues as well as for a complete physical exam.  Has been seeing Dr Sharlet Salina.  S/p injections.  Helped some.   Has two torn rotator cuffs.  Takes vicodin and neurontin.  Increased pain in the left - rotator cuff.  Was given a prednisone taper.  Helped some.  Pain back.  Planning to follow up with Dr Sharlet Salina for an injection.  Has recently been diagnosed with breast cancer.  Is s/p XRT.  Has f/u with surgery and oncology today.  Reports increased fatigue.  Off estrogen.  Has stopped the wellbutrin.  On effexor now.  Lower dose.  Increased stress with her husbands medical issues.  Discussed this at length.  She wants to increase the dose of effexor.   She states her sugars have been doing well.  Previously had an episode of low sugar.  Occurred when she had gone a while without eating.  Seeing endocrinology for thyroid nodule found on PET scan.  Biopsy negative.     Past Medical History  Diagnosis Date  . Hypertension   . Diabetes mellitus   . Hypercholesterolemia   . Endometriosis     requiring hysterectomy  . Nephrolithiasis   . Anxiety and depression   . Pericarditis     recurrent, unkown origin  . Tachycardia   . Depression   . History of chicken pox   . Colon polyps     Current Outpatient Prescriptions on File Prior to Visit  Medication Sig Dispense Refill  . anastrozole (ARIMIDEX) 1 MG tablet Take 1 mg by mouth daily.      Marland Kitchen aspirin 81 MG tablet Take 81 mg by mouth daily.      Marland Kitchen atorvastatin (LIPITOR) 40 MG tablet TAKE 1 TABLET BY MOUTH EVERY DAY  90 tablet  1  . gabapentin (NEURONTIN) 300 MG capsule 300 mg. Take 3 capsules q hs      . HYDROcodone-acetaminophen (NORCO/VICODIN) 5-325 MG per  tablet Take 1 tablet by mouth as needed.      . latanoprost (XALATAN) 0.005 % ophthalmic solution Place 1 drop into the left eye at bedtime.      Marland Kitchen losartan (COZAAR) 100 MG tablet Take 1 tablet (100 mg total) by mouth daily.  30 tablet  5  . metFORMIN (GLUCOPHAGE) 500 MG tablet Take 500 mg by mouth 2 (two) times daily with a meal.       . pantoprazole (PROTONIX) 40 MG tablet Take 1 tablet (40 mg total) by mouth 2 (two) times daily.  60 tablet  5   No current facility-administered medications on file prior to visit.    Review of Systems Patient denies any headache, lightheadedness or dizziness.  No significant sinus or allergy symptoms.  No chest pain, tightness or palpitations.  No increased shortness of breath, cough or congestion.  Breathing stable.  No nausea or vomiting.  No abdominal pain or cramping.  No bowel change, such as diarrhea, constipation, BRBPR or melana.  No urine change.  Increased stress as outlined.  Increased fatigue.  Increased pain right rotator cuff.  Seeing Dr Sharlet Salina.        Objective:   Physical Exam  Filed Vitals:   10/15/13 1329  BP: 110/60  Pulse: 104  Temp: 97.7 F (36.5 C)   Pulse recheck:  71  68 year old female in no acute distress.   HEENT:  Nares- clear.  Oropharynx - without lesions. NECK:  Supple.  Nontender.  No audible bruit.  HEART:  Appears to be regular. LUNGS:  No crackles or wheezing audible.  Respirations even and unlabored.  RADIAL PULSE:  Equal bilaterally.    BREASTS:  No nipple discharge or nipple retraction present.  Could not appreciate any distinct nodules or axillary adenopathy.  ABDOMEN:  Soft, nontender.  Bowel sounds present and normal.  No audible abdominal bruit.  GU:  Not performed.    EXTREMITIES:  No increased edema present.  DP pulses palpable and equal bilaterally.       FOOT EXAM:  No lesions         Assessment & Plan:  CARDIOVASCULAR.  ECHO 05/23/10 revealed EF 60% with mild left atrial enlargement and moderate  mitral insufficiency and mild tricuspid insufficiency.  Sees Dr Sharlet Salina [facility] cardiology).  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  Currently asymptomatic.    PULMONARY.  Breathing stable.  Follow.   FATIGUE.  Probably multifactorial.  S/p XRT. Increased stress.  Increase effexor as outlined.  Follow.   INCREASED PSYCHOSOCIAL STRESSORS.  Increased stress with her husbands medical issues.  Increased stress with her chronic pain and current fatigue s/p breast cancer treatment.  She stopped wellbutrin.  On effexor.  On lower dose now.  Will increase effexor to 150mg  q day.  Follow.    GI.  Colonoscopy 07/24/10 revealed diverticulosis and removal of a polyp (ascending colon) - tubular adenomatous polyp.  Recommended follow up colonoscopy five years.    HEALTH MAINTENANCE.  Physical today.  Is s/p hysterectomy and does not require yearly pap smears.  Colonoscopy as outlined.  Being followed by oncology for her breast cancer.  I spent 40 minutes with the patient and more than 50% of the time was spent in consultation regarding the above.

## 2013-10-18 NOTE — Assessment & Plan Note (Signed)
Blood pressure under good control.  Same medication regimen.  Follow metabolic panel.   

## 2013-10-18 NOTE — Assessment & Plan Note (Signed)
Recently diagnosed.  Is being followed at [facility].  S/p XRT.

## 2013-10-18 NOTE — Assessment & Plan Note (Signed)
Brought in no sugar readings.  States under good control as outlined.   A1c just checked 04/22/13 - 6.6.  Eye exam 7/14.

## 2013-10-22 ENCOUNTER — Other Ambulatory Visit (INDEPENDENT_AMBULATORY_CARE_PROVIDER_SITE_OTHER): Payer: Medicare Other

## 2013-10-22 DIAGNOSIS — I10 Essential (primary) hypertension: Secondary | ICD-10-CM

## 2013-10-22 DIAGNOSIS — E119 Type 2 diabetes mellitus without complications: Secondary | ICD-10-CM | POA: Diagnosis not present

## 2013-10-22 DIAGNOSIS — IMO0002 Reserved for concepts with insufficient information to code with codable children: Secondary | ICD-10-CM | POA: Diagnosis not present

## 2013-10-22 DIAGNOSIS — E041 Nontoxic single thyroid nodule: Secondary | ICD-10-CM | POA: Diagnosis not present

## 2013-10-22 DIAGNOSIS — C50919 Malignant neoplasm of unspecified site of unspecified female breast: Secondary | ICD-10-CM

## 2013-10-22 DIAGNOSIS — M5137 Other intervertebral disc degeneration, lumbosacral region: Secondary | ICD-10-CM | POA: Diagnosis not present

## 2013-10-22 DIAGNOSIS — E7800 Pure hypercholesterolemia, unspecified: Secondary | ICD-10-CM

## 2013-10-22 DIAGNOSIS — M25819 Other specified joint disorders, unspecified shoulder: Secondary | ICD-10-CM | POA: Diagnosis not present

## 2013-10-22 DIAGNOSIS — M47817 Spondylosis without myelopathy or radiculopathy, lumbosacral region: Secondary | ICD-10-CM | POA: Diagnosis not present

## 2013-10-22 DIAGNOSIS — M67919 Unspecified disorder of synovium and tendon, unspecified shoulder: Secondary | ICD-10-CM | POA: Diagnosis not present

## 2013-10-22 DIAGNOSIS — M25519 Pain in unspecified shoulder: Secondary | ICD-10-CM | POA: Diagnosis not present

## 2013-10-22 LAB — CBC WITH DIFFERENTIAL/PLATELET
BASOS PCT: 0.2 % (ref 0.0–3.0)
Basophils Absolute: 0 10*3/uL (ref 0.0–0.1)
EOS ABS: 0.1 10*3/uL (ref 0.0–0.7)
Eosinophils Relative: 2 % (ref 0.0–5.0)
HEMATOCRIT: 38.1 % (ref 36.0–46.0)
HEMOGLOBIN: 12.3 g/dL (ref 12.0–15.0)
Lymphocytes Relative: 36.6 % (ref 12.0–46.0)
Lymphs Abs: 1.5 10*3/uL (ref 0.7–4.0)
MCHC: 32.3 g/dL (ref 30.0–36.0)
MCV: 93.8 fl (ref 78.0–100.0)
MONO ABS: 0.3 10*3/uL (ref 0.1–1.0)
Monocytes Relative: 8.5 % (ref 3.0–12.0)
Neutro Abs: 2.1 10*3/uL (ref 1.4–7.7)
Neutrophils Relative %: 52.7 % (ref 43.0–77.0)
Platelets: 344 10*3/uL (ref 150.0–400.0)
RBC: 4.07 Mil/uL (ref 3.87–5.11)
RDW: 13.6 % (ref 11.5–14.6)
WBC: 4 10*3/uL — AB (ref 4.5–10.5)

## 2013-10-22 LAB — LIPID PANEL
CHOL/HDL RATIO: 3
Cholesterol: 179 mg/dL (ref 0–200)
HDL: 51.7 mg/dL (ref >39.00)
LDL CALC: 109 mg/dL — AB (ref 0–99)
TRIGLYCERIDES: 92 mg/dL (ref 0.0–149.0)
VLDL: 18.4 mg/dL (ref 0.0–40.0)

## 2013-10-22 LAB — HEPATIC FUNCTION PANEL
ALBUMIN: 4.2 g/dL (ref 3.5–5.2)
ALT: 19 U/L (ref 0–35)
AST: 14 U/L (ref 0–37)
Alkaline Phosphatase: 92 U/L (ref 39–117)
BILIRUBIN DIRECT: 0.1 mg/dL (ref 0.0–0.3)
BILIRUBIN TOTAL: 1.2 mg/dL (ref 0.3–1.2)
Total Protein: 6.6 g/dL (ref 6.0–8.3)

## 2013-10-22 LAB — TSH: TSH: 1.23 u[IU]/mL (ref 0.35–5.50)

## 2013-10-22 LAB — BASIC METABOLIC PANEL
BUN: 13 mg/dL (ref 6–23)
CALCIUM: 9.7 mg/dL (ref 8.4–10.5)
CHLORIDE: 104 meq/L (ref 96–112)
CO2: 28 meq/L (ref 19–32)
CREATININE: 0.6 mg/dL (ref 0.4–1.2)
GFR: 114.55 mL/min (ref >60.00)
Glucose, Bld: 122 mg/dL — ABNORMAL HIGH (ref 70–99)
Potassium: 4.3 mEq/L (ref 3.5–5.1)
Sodium: 139 mEq/L (ref 135–145)

## 2013-10-22 LAB — HEMOGLOBIN A1C: Hgb A1c MFr Bld: 6.8 % — ABNORMAL HIGH (ref 4.6–6.5)

## 2013-10-22 LAB — MICROALBUMIN / CREATININE URINE RATIO
CREATININE, U: 95.1 mg/dL
Microalb Creat Ratio: 0.2 mg/g (ref 0.0–30.0)
Microalb, Ur: 0.2 mg/dL (ref 0.0–1.9)

## 2013-10-23 ENCOUNTER — Encounter: Payer: Self-pay | Admitting: Internal Medicine

## 2013-10-26 NOTE — Telephone Encounter (Signed)
Mailed unread message to pt  

## 2013-11-13 ENCOUNTER — Other Ambulatory Visit: Payer: Self-pay | Admitting: *Deleted

## 2013-11-16 NOTE — Telephone Encounter (Signed)
Mailed unread my chart message to patient

## 2013-11-18 DIAGNOSIS — IMO0002 Reserved for concepts with insufficient information to code with codable children: Secondary | ICD-10-CM | POA: Diagnosis not present

## 2013-11-18 DIAGNOSIS — M5137 Other intervertebral disc degeneration, lumbosacral region: Secondary | ICD-10-CM | POA: Diagnosis not present

## 2013-11-22 ENCOUNTER — Other Ambulatory Visit: Payer: Self-pay | Admitting: Internal Medicine

## 2013-11-25 ENCOUNTER — Ambulatory Visit: Payer: Self-pay | Admitting: Physical Medicine and Rehabilitation

## 2013-11-25 DIAGNOSIS — M5126 Other intervertebral disc displacement, lumbar region: Secondary | ICD-10-CM | POA: Diagnosis not present

## 2013-11-25 DIAGNOSIS — M5137 Other intervertebral disc degeneration, lumbosacral region: Secondary | ICD-10-CM | POA: Diagnosis not present

## 2013-11-25 DIAGNOSIS — M48061 Spinal stenosis, lumbar region without neurogenic claudication: Secondary | ICD-10-CM | POA: Diagnosis not present

## 2013-12-03 DIAGNOSIS — C44519 Basal cell carcinoma of skin of other part of trunk: Secondary | ICD-10-CM | POA: Diagnosis not present

## 2013-12-03 DIAGNOSIS — L905 Scar conditions and fibrosis of skin: Secondary | ICD-10-CM | POA: Diagnosis not present

## 2013-12-08 ENCOUNTER — Other Ambulatory Visit: Payer: Self-pay | Admitting: Internal Medicine

## 2013-12-14 DIAGNOSIS — C44519 Basal cell carcinoma of skin of other part of trunk: Secondary | ICD-10-CM | POA: Diagnosis not present

## 2013-12-18 ENCOUNTER — Encounter: Payer: Self-pay | Admitting: Internal Medicine

## 2013-12-18 ENCOUNTER — Ambulatory Visit (INDEPENDENT_AMBULATORY_CARE_PROVIDER_SITE_OTHER): Payer: Medicare Other | Admitting: Internal Medicine

## 2013-12-18 VITALS — BP 120/62 | HR 80 | Temp 98.0°F | Resp 16 | Ht 64.0 in | Wt 158.8 lb

## 2013-12-18 DIAGNOSIS — E7800 Pure hypercholesterolemia, unspecified: Secondary | ICD-10-CM

## 2013-12-18 DIAGNOSIS — G8929 Other chronic pain: Secondary | ICD-10-CM

## 2013-12-18 DIAGNOSIS — M549 Dorsalgia, unspecified: Secondary | ICD-10-CM | POA: Diagnosis not present

## 2013-12-18 DIAGNOSIS — K219 Gastro-esophageal reflux disease without esophagitis: Secondary | ICD-10-CM

## 2013-12-18 DIAGNOSIS — Z86010 Personal history of colonic polyps: Secondary | ICD-10-CM

## 2013-12-18 DIAGNOSIS — E119 Type 2 diabetes mellitus without complications: Secondary | ICD-10-CM

## 2013-12-18 DIAGNOSIS — I10 Essential (primary) hypertension: Secondary | ICD-10-CM

## 2013-12-18 DIAGNOSIS — M75100 Unspecified rotator cuff tear or rupture of unspecified shoulder, not specified as traumatic: Secondary | ICD-10-CM

## 2013-12-18 DIAGNOSIS — C50919 Malignant neoplasm of unspecified site of unspecified female breast: Secondary | ICD-10-CM

## 2013-12-18 DIAGNOSIS — S43429A Sprain of unspecified rotator cuff capsule, initial encounter: Secondary | ICD-10-CM

## 2013-12-18 DIAGNOSIS — E041 Nontoxic single thyroid nodule: Secondary | ICD-10-CM

## 2013-12-18 MED ORDER — ACYCLOVIR 5 % EX CREA: TOPICAL_CREAM | CUTANEOUS | Status: DC

## 2013-12-18 NOTE — Progress Notes (Signed)
Pre visit review using our clinic review tool, if applicable. No additional management support is needed unless otherwise documented below in the visit note. 

## 2013-12-18 NOTE — Progress Notes (Signed)
Subjective:    Patient ID: Sherry Simon, female    DOB: 1946/01/15, 68 y.o.   MRN: [id number]  HPI 68 year old female with past history of tachycardia, palpitations, anxiety/depression, diabetes, hypertension and pericarditis of unknown origin.  She comes in today for a scheduled follow up.   Has been seeing Dr Sharlet Salina.  S/p injections.  Helped some.   Has two torn rotator cuffs.  Takes vicodin and neurontin.  Increased pain in the left - rotator cuff.  Was given a prednisone taper.  Helped some.  Subsequently had injection in her shoulder.  This combined with duragesic patch - has helped some.  Pain back.  Seeing Dr Sharlet Salina for this as well.  Has a herniated disc.  Having some increased left leg numbness.  Planning to see Dr Marcos Eke - [facility].  When she lies down - gets some relief.   Has recently been diagnosed with breast cancer.  Is s/p XRT.  Seeing surgery and oncology.  Increased stress with her husbands medical issues.  Discussed this at length.  On effexor.  This dose is working better for her.  Seeing endocrinology for thyroid nodule found on PET scan.  Biopsy negative.      Past Medical History  Diagnosis Date  . Hypertension   . Diabetes mellitus   . Hypercholesterolemia   . Endometriosis     requiring hysterectomy  . Nephrolithiasis   . Anxiety and depression   . Pericarditis     recurrent, unkown origin  . Tachycardia   . Depression   . History of chicken pox   . Colon polyps     Current Outpatient Prescriptions on File Prior to Visit  Medication Sig Dispense Refill  . anastrozole (ARIMIDEX) 1 MG tablet Take 1 mg by mouth daily.      Marland Kitchen aspirin 81 MG tablet Take 81 mg by mouth daily.      Marland Kitchen atorvastatin (LIPITOR) 40 MG tablet TAKE 1 TABLET BY MOUTH EVERY DAY  90 tablet  1  . gabapentin (NEURONTIN) 300 MG capsule 300 mg. Take 3 capsules q hs      . HYDROcodone-acetaminophen (NORCO/VICODIN) 5-325 MG per tablet Take 1 tablet by mouth as needed.      . latanoprost  (XALATAN) 0.005 % ophthalmic solution Place 1 drop into the left eye at bedtime.      Marland Kitchen losartan (COZAAR) 100 MG tablet TAKE 1 TABLET (100 MG TOTAL) BY MOUTH DAILY.  30 tablet  5  . metFORMIN (GLUCOPHAGE) 500 MG tablet Take 500 mg by mouth 2 (two) times daily with a meal.       . pantoprazole (PROTONIX) 40 MG tablet TAKE 1 TABLET BY MOUTH TWICE A DAY  60 tablet  5  . venlafaxine XR (EFFEXOR XR) 150 MG 24 hr capsule Take 1 capsule (150 mg total) by mouth daily with breakfast.  30 capsule  3   No current facility-administered medications on file prior to visit.    Review of Systems Patient denies any headache, lightheadedness or dizziness.  No significant sinus or allergy symptoms.  No chest pain, tightness or palpitations.  No increased shortness of breath, cough or congestion.  Breathing stable.  No nausea or vomiting.  No abdominal pain or cramping.  No bowel change, such as diarrhea, constipation, BRBPR or melana.  No urine change.  Increased stress as outlined.  Increased fatigue.  Back and leg pain and numbness as outlined.        Objective:  Physical Exam  Filed Vitals:   12/18/13 1140  BP: 120/62  Pulse: 80  Temp: 98 F (36.7 C)  Resp: 16   Blood pressure recheck:  68/24  68 year old female in no acute distress.   HEENT:  Nares- clear.  Oropharynx - without lesions. NECK:  Supple.  Nontender.  No audible bruit.  HEART:  Appears to be regular. LUNGS:  No crackles or wheezing audible.  Respirations even and unlabored.  RADIAL PULSE:  Equal bilaterally.   ABDOMEN:  Soft, nontender.  Bowel sounds present and normal.  No audible abdominal bruit.   EXTREMITIES:  No increased edema present.  DP pulses palpable and equal bilaterally.      FOOT EXAM:  No lesions         Assessment & Plan:  CARDIOVASCULAR.  ECHO 05/23/10 revealed EF 60% with mild left atrial enlargement and moderate mitral insufficiency and mild tricuspid insufficiency.  Sees Dr Sharlet Salina [facility] cardiology).  Has had  reoccurring episodes of pericarditis of unknown origin.  Currently asymptomatic.    PULMONARY.  Breathing stable.  Follow.   INCREASED PSYCHOSOCIAL STRESSORS.  Increased stress with her husbands medical issues.  Increased stress with her chronic pain and current fatigue s/p breast cancer treatment.  She stopped wellbutrin.  On effexor.  Back on 150mg  now.  Follow.  Doing better.    GI.  Colonoscopy 07/24/10 revealed diverticulosis and removal of a polyp (ascending colon) - tubular adenomatous polyp.  Recommended follow up colonoscopy five years.    HEALTH MAINTENANCE.  Physical 10/15/13.  Is s/p hysterectomy and does not require yearly pap smears.  Colonoscopy as outlined.  Being followed by oncology for her breast cancer.  I spent more than 40 minutes with the patient and more than 50% of the time was spent in consultation regarding the above.

## 2013-12-20 ENCOUNTER — Encounter: Payer: Self-pay | Admitting: Internal Medicine

## 2013-12-20 NOTE — Assessment & Plan Note (Signed)
S/p biopsy.  Benign.  Follow.

## 2013-12-20 NOTE — Assessment & Plan Note (Signed)
On protonix.  No reflux reported today.  Follow.

## 2013-12-20 NOTE — Assessment & Plan Note (Signed)
Colonoscopy as outlined.  Follow.  Bowels stable.    

## 2013-12-20 NOTE — Assessment & Plan Note (Signed)
Blood pressure under good control.  Same medication regimen.  Follow metabolic panel.   

## 2013-12-20 NOTE — Assessment & Plan Note (Signed)
Has been seeing Dr Sharlet Salina.  MRI revealed herniated disc.  Having increased pain and increased left leg numbness/tingling.  Planning to see Dr Marcos Eke.  May need fusion.

## 2013-12-20 NOTE — Assessment & Plan Note (Signed)
Recently diagnosed.  Is being followed at [facility].  S/p XRT.

## 2013-12-20 NOTE — Assessment & Plan Note (Signed)
On lipitor.  Low cholesterol diet and exercise.   Follow lipid panel and liver function.   

## 2013-12-20 NOTE — Assessment & Plan Note (Signed)
Brought in no sugar readings.  States under good control as outlined.   Follow metabolic panel and C1Y.   Eye exam 7/14.

## 2013-12-20 NOTE — Assessment & Plan Note (Signed)
Right > left.  Planning to f/u with Dr Sharlet Salina.  S/p injection.  Duragesic patch.  Helping.

## 2013-12-30 ENCOUNTER — Other Ambulatory Visit: Payer: Self-pay | Admitting: Internal Medicine

## 2014-01-01 DIAGNOSIS — C44519 Basal cell carcinoma of skin of other part of trunk: Secondary | ICD-10-CM | POA: Diagnosis not present

## 2014-01-04 ENCOUNTER — Other Ambulatory Visit: Payer: Self-pay | Admitting: *Deleted

## 2014-01-04 DIAGNOSIS — M899 Disorder of bone, unspecified: Secondary | ICD-10-CM | POA: Diagnosis not present

## 2014-01-04 DIAGNOSIS — M949 Disorder of cartilage, unspecified: Secondary | ICD-10-CM | POA: Diagnosis not present

## 2014-01-04 DIAGNOSIS — C50919 Malignant neoplasm of unspecified site of unspecified female breast: Secondary | ICD-10-CM | POA: Diagnosis not present

## 2014-01-04 MED ORDER — VENLAFAXINE HCL ER 150 MG PO CP24: 150.0000 mg | ORAL_CAPSULE | Freq: Every day | ORAL | Status: DC

## 2014-01-12 DIAGNOSIS — IMO0002 Reserved for concepts with insufficient information to code with codable children: Secondary | ICD-10-CM | POA: Diagnosis not present

## 2014-01-12 DIAGNOSIS — M5126 Other intervertebral disc displacement, lumbar region: Secondary | ICD-10-CM | POA: Diagnosis not present

## 2014-01-16 ENCOUNTER — Other Ambulatory Visit: Payer: Self-pay | Admitting: Internal Medicine

## 2014-01-17 ENCOUNTER — Other Ambulatory Visit: Payer: Self-pay | Admitting: Internal Medicine

## 2014-01-20 DIAGNOSIS — M654 Radial styloid tenosynovitis [de Quervain]: Secondary | ICD-10-CM | POA: Diagnosis not present

## 2014-01-21 DIAGNOSIS — M5137 Other intervertebral disc degeneration, lumbosacral region: Secondary | ICD-10-CM | POA: Diagnosis not present

## 2014-01-21 DIAGNOSIS — Q762 Congenital spondylolisthesis: Secondary | ICD-10-CM | POA: Diagnosis not present

## 2014-01-21 DIAGNOSIS — M48061 Spinal stenosis, lumbar region without neurogenic claudication: Secondary | ICD-10-CM | POA: Diagnosis not present

## 2014-01-21 DIAGNOSIS — IMO0002 Reserved for concepts with insufficient information to code with codable children: Secondary | ICD-10-CM | POA: Diagnosis not present

## 2014-01-25 DIAGNOSIS — Z01818 Encounter for other preprocedural examination: Secondary | ICD-10-CM | POA: Diagnosis not present

## 2014-02-03 DIAGNOSIS — M4310 Spondylolisthesis, site unspecified: Secondary | ICD-10-CM | POA: Diagnosis not present

## 2014-02-03 DIAGNOSIS — IMO0002 Reserved for concepts with insufficient information to code with codable children: Secondary | ICD-10-CM | POA: Diagnosis not present

## 2014-02-03 DIAGNOSIS — M48061 Spinal stenosis, lumbar region without neurogenic claudication: Secondary | ICD-10-CM | POA: Diagnosis not present

## 2014-02-03 DIAGNOSIS — M4180 Other forms of scoliosis, site unspecified: Secondary | ICD-10-CM | POA: Diagnosis not present

## 2014-02-03 DIAGNOSIS — E119 Type 2 diabetes mellitus without complications: Secondary | ICD-10-CM | POA: Diagnosis not present

## 2014-02-04 DIAGNOSIS — IMO0002 Reserved for concepts with insufficient information to code with codable children: Secondary | ICD-10-CM | POA: Diagnosis not present

## 2014-02-04 DIAGNOSIS — E119 Type 2 diabetes mellitus without complications: Secondary | ICD-10-CM | POA: Diagnosis not present

## 2014-02-04 DIAGNOSIS — M48061 Spinal stenosis, lumbar region without neurogenic claudication: Secondary | ICD-10-CM | POA: Diagnosis not present

## 2014-02-06 ENCOUNTER — Other Ambulatory Visit: Payer: Self-pay | Admitting: Internal Medicine

## 2014-02-23 DIAGNOSIS — M545 Low back pain, unspecified: Secondary | ICD-10-CM | POA: Diagnosis not present

## 2014-02-25 DIAGNOSIS — Z961 Presence of intraocular lens: Secondary | ICD-10-CM | POA: Diagnosis not present

## 2014-02-25 DIAGNOSIS — H4060X0 Glaucoma secondary to drugs, unspecified eye, stage unspecified: Secondary | ICD-10-CM | POA: Diagnosis not present

## 2014-02-25 DIAGNOSIS — H409 Unspecified glaucoma: Secondary | ICD-10-CM | POA: Diagnosis not present

## 2014-02-25 DIAGNOSIS — H5210 Myopia, unspecified eye: Secondary | ICD-10-CM | POA: Diagnosis not present

## 2014-02-25 DIAGNOSIS — H338 Other retinal detachments: Secondary | ICD-10-CM | POA: Diagnosis not present

## 2014-02-25 DIAGNOSIS — E119 Type 2 diabetes mellitus without complications: Secondary | ICD-10-CM | POA: Diagnosis not present

## 2014-02-25 DIAGNOSIS — H4420 Degenerative myopia, unspecified eye: Secondary | ICD-10-CM | POA: Diagnosis not present

## 2014-02-25 DIAGNOSIS — H3320 Serous retinal detachment, unspecified eye: Secondary | ICD-10-CM | POA: Diagnosis not present

## 2014-03-08 DIAGNOSIS — M654 Radial styloid tenosynovitis [de Quervain]: Secondary | ICD-10-CM | POA: Diagnosis not present

## 2014-03-08 DIAGNOSIS — M6530 Trigger finger, unspecified finger: Secondary | ICD-10-CM | POA: Diagnosis not present

## 2014-03-18 ENCOUNTER — Other Ambulatory Visit: Payer: Self-pay | Admitting: Internal Medicine

## 2014-03-19 ENCOUNTER — Other Ambulatory Visit (INDEPENDENT_AMBULATORY_CARE_PROVIDER_SITE_OTHER): Payer: Medicare Other

## 2014-03-19 ENCOUNTER — Telehealth: Payer: Self-pay | Admitting: *Deleted

## 2014-03-19 DIAGNOSIS — R5383 Other fatigue: Principal | ICD-10-CM

## 2014-03-19 DIAGNOSIS — E119 Type 2 diabetes mellitus without complications: Secondary | ICD-10-CM | POA: Diagnosis not present

## 2014-03-19 DIAGNOSIS — E7800 Pure hypercholesterolemia, unspecified: Secondary | ICD-10-CM | POA: Diagnosis not present

## 2014-03-19 DIAGNOSIS — R5381 Other malaise: Secondary | ICD-10-CM

## 2014-03-19 LAB — HEPATIC FUNCTION PANEL
ALK PHOS: 105 U/L (ref 39–117)
ALT: 11 U/L (ref 0–35)
AST: 14 U/L (ref 0–37)
Albumin: 4.3 g/dL (ref 3.5–5.2)
Bilirubin, Direct: 0 mg/dL (ref 0.0–0.3)
TOTAL PROTEIN: 7.1 g/dL (ref 6.0–8.3)
Total Bilirubin: 0.7 mg/dL (ref 0.2–1.2)

## 2014-03-19 LAB — BASIC METABOLIC PANEL
BUN: 16 mg/dL (ref 6–23)
CHLORIDE: 103 meq/L (ref 96–112)
CO2: 28 mEq/L (ref 19–32)
CREATININE: 0.6 mg/dL (ref 0.4–1.2)
Calcium: 9.4 mg/dL (ref 8.4–10.5)
GFR: 103.66 mL/min (ref >60.00)
Glucose, Bld: 113 mg/dL — ABNORMAL HIGH (ref 70–99)
Potassium: 4.4 mEq/L (ref 3.5–5.1)
Sodium: 138 mEq/L (ref 135–145)

## 2014-03-19 LAB — HEMOGLOBIN A1C: Hgb A1c MFr Bld: 6.6 % — ABNORMAL HIGH (ref 4.6–6.5)

## 2014-03-19 LAB — LIPID PANEL
Cholesterol: 159 mg/dL (ref 0–200)
HDL: 56.4 mg/dL (ref >39.00)
LDL Cholesterol: 81 mg/dL (ref 0–99)
NonHDL: 102.6
TRIGLYCERIDES: 109 mg/dL (ref 0.0–149.0)
Total CHOL/HDL Ratio: 3
VLDL: 21.8 mg/dL (ref 0.0–40.0)

## 2014-03-19 LAB — TSH: TSH: 0.46 u[IU]/mL (ref 0.35–4.50)

## 2014-03-19 NOTE — Telephone Encounter (Signed)
Pt would like a tsh?

## 2014-03-19 NOTE — Telephone Encounter (Signed)
Order placed for tsh 

## 2014-03-20 ENCOUNTER — Encounter: Payer: Self-pay | Admitting: Internal Medicine

## 2014-03-22 NOTE — Telephone Encounter (Signed)
Unread mychart message mailed to paitent 

## 2014-03-24 ENCOUNTER — Ambulatory Visit: Payer: Medicare Other | Admitting: Internal Medicine

## 2014-03-25 DIAGNOSIS — M545 Low back pain, unspecified: Secondary | ICD-10-CM | POA: Diagnosis not present

## 2014-04-22 DIAGNOSIS — IMO0002 Reserved for concepts with insufficient information to code with codable children: Secondary | ICD-10-CM | POA: Diagnosis not present

## 2014-04-28 ENCOUNTER — Encounter: Payer: Self-pay | Admitting: Internal Medicine

## 2014-04-28 ENCOUNTER — Ambulatory Visit: Payer: Medicare Other | Admitting: Internal Medicine

## 2014-04-28 DIAGNOSIS — M549 Dorsalgia, unspecified: Principal | ICD-10-CM

## 2014-04-28 DIAGNOSIS — Z0289 Encounter for other administrative examinations: Secondary | ICD-10-CM

## 2014-04-28 DIAGNOSIS — G8929 Other chronic pain: Secondary | ICD-10-CM

## 2014-05-07 DIAGNOSIS — M5126 Other intervertebral disc displacement, lumbar region: Secondary | ICD-10-CM | POA: Diagnosis not present

## 2014-05-07 DIAGNOSIS — Z981 Arthrodesis status: Secondary | ICD-10-CM | POA: Diagnosis not present

## 2014-05-07 DIAGNOSIS — IMO0002 Reserved for concepts with insufficient information to code with codable children: Secondary | ICD-10-CM | POA: Diagnosis not present

## 2014-05-07 DIAGNOSIS — R209 Unspecified disturbances of skin sensation: Secondary | ICD-10-CM | POA: Diagnosis not present

## 2014-05-18 DIAGNOSIS — M545 Low back pain, unspecified: Secondary | ICD-10-CM | POA: Diagnosis not present

## 2014-05-27 DIAGNOSIS — H35039 Hypertensive retinopathy, unspecified eye: Secondary | ICD-10-CM | POA: Diagnosis not present

## 2014-06-02 ENCOUNTER — Other Ambulatory Visit: Payer: Self-pay | Admitting: Internal Medicine

## 2014-06-07 DIAGNOSIS — M5137 Other intervertebral disc degeneration, lumbosacral region: Secondary | ICD-10-CM | POA: Diagnosis not present

## 2014-06-07 DIAGNOSIS — IMO0002 Reserved for concepts with insufficient information to code with codable children: Secondary | ICD-10-CM | POA: Diagnosis not present

## 2014-06-07 DIAGNOSIS — M4120 Other idiopathic scoliosis, site unspecified: Secondary | ICD-10-CM | POA: Diagnosis not present

## 2014-06-07 DIAGNOSIS — M47817 Spondylosis without myelopathy or radiculopathy, lumbosacral region: Secondary | ICD-10-CM | POA: Diagnosis not present

## 2014-06-07 DIAGNOSIS — Q762 Congenital spondylolisthesis: Secondary | ICD-10-CM | POA: Diagnosis not present

## 2014-06-07 DIAGNOSIS — Z79899 Other long term (current) drug therapy: Secondary | ICD-10-CM | POA: Diagnosis not present

## 2014-06-14 DIAGNOSIS — Z85828 Personal history of other malignant neoplasm of skin: Secondary | ICD-10-CM | POA: Diagnosis not present

## 2014-06-14 DIAGNOSIS — D1801 Hemangioma of skin and subcutaneous tissue: Secondary | ICD-10-CM | POA: Diagnosis not present

## 2014-06-14 DIAGNOSIS — L570 Actinic keratosis: Secondary | ICD-10-CM | POA: Diagnosis not present

## 2014-06-14 DIAGNOSIS — L821 Other seborrheic keratosis: Secondary | ICD-10-CM | POA: Diagnosis not present

## 2014-06-14 DIAGNOSIS — Z1283 Encounter for screening for malignant neoplasm of skin: Secondary | ICD-10-CM | POA: Diagnosis not present

## 2014-06-14 DIAGNOSIS — L723 Sebaceous cyst: Secondary | ICD-10-CM | POA: Diagnosis not present

## 2014-06-18 ENCOUNTER — Encounter: Payer: Self-pay | Admitting: Internal Medicine

## 2014-06-18 ENCOUNTER — Ambulatory Visit (INDEPENDENT_AMBULATORY_CARE_PROVIDER_SITE_OTHER): Payer: Medicare Other | Admitting: Internal Medicine

## 2014-06-18 VITALS — BP 110/66 | HR 86 | Temp 98.0°F | Resp 14 | Ht 64.0 in | Wt 157.8 lb

## 2014-06-18 DIAGNOSIS — M25649 Stiffness of unspecified hand, not elsewhere classified: Secondary | ICD-10-CM

## 2014-06-18 DIAGNOSIS — G8929 Other chronic pain: Secondary | ICD-10-CM

## 2014-06-18 DIAGNOSIS — E119 Type 2 diabetes mellitus without complications: Secondary | ICD-10-CM | POA: Diagnosis not present

## 2014-06-18 DIAGNOSIS — K219 Gastro-esophageal reflux disease without esophagitis: Secondary | ICD-10-CM | POA: Diagnosis not present

## 2014-06-18 DIAGNOSIS — Z23 Encounter for immunization: Secondary | ICD-10-CM | POA: Diagnosis not present

## 2014-06-18 DIAGNOSIS — E041 Nontoxic single thyroid nodule: Secondary | ICD-10-CM

## 2014-06-18 DIAGNOSIS — M549 Dorsalgia, unspecified: Secondary | ICD-10-CM | POA: Diagnosis not present

## 2014-06-18 LAB — CBC WITH DIFFERENTIAL/PLATELET
BASOS ABS: 0 10*3/uL (ref 0.0–0.1)
Basophils Relative: 0.2 % (ref 0.0–3.0)
EOS ABS: 0.1 10*3/uL (ref 0.0–0.7)
Eosinophils Relative: 1.6 % (ref 0.0–5.0)
HEMATOCRIT: 36.7 % (ref 36.0–46.0)
Hemoglobin: 12.1 g/dL (ref 12.0–15.0)
LYMPHS ABS: 2.2 10*3/uL (ref 0.7–4.0)
Lymphocytes Relative: 41.9 % (ref 12.0–46.0)
MCHC: 33 g/dL (ref 30.0–36.0)
MCV: 90.8 fl (ref 78.0–100.0)
MONO ABS: 0.4 10*3/uL (ref 0.1–1.0)
Monocytes Relative: 7.9 % (ref 3.0–12.0)
Neutro Abs: 2.5 10*3/uL (ref 1.4–7.7)
Neutrophils Relative %: 48.4 % (ref 43.0–77.0)
PLATELETS: 319 10*3/uL (ref 150.0–400.0)
RBC: 4.05 Mil/uL (ref 3.87–5.11)
RDW: 13 % (ref 11.5–15.5)
WBC: 5.2 10*3/uL (ref 4.0–10.5)

## 2014-06-18 LAB — BASIC METABOLIC PANEL
BUN: 13 mg/dL (ref 6–23)
CALCIUM: 9.6 mg/dL (ref 8.4–10.5)
CO2: 27 mEq/L (ref 19–32)
Chloride: 104 mEq/L (ref 96–112)
Creatinine, Ser: 0.6 mg/dL (ref 0.4–1.2)
GFR: 105.57 mL/min (ref >60.00)
Glucose, Bld: 94 mg/dL (ref 70–99)
Potassium: 4.2 mEq/L (ref 3.5–5.1)
Sodium: 137 mEq/L (ref 135–145)

## 2014-06-18 LAB — RHEUMATOID FACTOR

## 2014-06-18 LAB — C-REACTIVE PROTEIN: CRP: <0.5 mg/dL (ref 0.5–20.0)

## 2014-06-18 NOTE — Progress Notes (Signed)
Pre visit review using our clinic review tool, if applicable. No additional management support is needed unless otherwise documented below in the visit note. 

## 2014-06-20 ENCOUNTER — Encounter: Payer: Self-pay | Admitting: Internal Medicine

## 2014-06-20 DIAGNOSIS — M25649 Stiffness of unspecified hand, not elsewhere classified: Secondary | ICD-10-CM | POA: Insufficient documentation

## 2014-06-20 NOTE — Progress Notes (Signed)
Subjective:    Patient ID: Sherry Simon, female    DOB: March 27, 1946, 68 y.o.   MRN: [id number]  Hand Pain   68 year old female with past history of tachycardia, palpitations, anxiety/depression, diabetes, hypertension and pericarditis of unknown origin.  She comes in today as a work in with concerns regarding bilateral hand pain and stiffness. Has known back pain.  Has been seeing Dr Sharlet Salina.  S/p injections.  Helped some.   Has two torn rotator cuffs.  Takes vicodin and neurontin.  Increased pain in the left - rotator cuff.  Was given a prednisone taper.  Helped some.  Subsequently had injection in her shoulder.  This combined with duragesic patch - has helped some.  Pain back.  Was seeing Dr Sharlet Salina for this as well.  Has a herniated disc.  Was having some increased left leg numbness.  Saw Dr Marcos Eke - [facility].  Is s/p surgery.  Did not help her back.  Still having issues with her back and leg.  States that she has now developed bilateral hand stiffness and pain.  Unable to make a fist.  Hurts to try to grip.  Unable to open a bottle top.  Worse in the am.  Better once up and moving and using her hands.      Past Medical History  Diagnosis Date  . Hypertension   . Diabetes mellitus   . Hypercholesterolemia   . Endometriosis     requiring hysterectomy  . Nephrolithiasis   . Anxiety and depression   . Pericarditis     recurrent, unkown origin  . Tachycardia   . Depression   . History of chicken pox   . Colon polyps     Current Outpatient Prescriptions on File Prior to Visit  Medication Sig Dispense Refill  . acyclovir cream (ZOVIRAX) 5 % Use as directed  15 g  0  . anastrozole (ARIMIDEX) 1 MG tablet Take 1 mg by mouth daily.      Marland Kitchen aspirin 81 MG tablet Take 81 mg by mouth daily.      Marland Kitchen atorvastatin (LIPITOR) 40 MG tablet TAKE 1 TABLET BY MOUTH EVERY DAY  90 tablet  1  . gabapentin (NEURONTIN) 300 MG capsule 300 mg. Take 3 capsules q hs      . latanoprost (XALATAN) 0.005 %  ophthalmic solution Place 1 drop into the left eye at bedtime.      Marland Kitchen losartan (COZAAR) 100 MG tablet TAKE 1 TABLET (100 MG TOTAL) BY MOUTH DAILY.  30 tablet  5  . ONE TOUCH ULTRA TEST test strip TEST BLOOD SUGAR TWICE A DAY  200 each  PRN  . pantoprazole (PROTONIX) 40 MG tablet TAKE 1 TABLET BY MOUTH TWICE A DAY  60 tablet  5  . venlafaxine XR (EFFEXOR XR) 150 MG 24 hr capsule Take 1 capsule (150 mg total) by mouth daily with breakfast.  30 capsule  2   No current facility-administered medications on file prior to visit.    Review of Systems No chest pain, tightness or palpitations.   Breathing stable.  No nausea or vomiting.  No abdominal pain or cramping.  Persistent back and leg pain as outlined.  Hand stiffness and pain as outlined.  Better once moving and using her hands more.         Objective:   Physical Exam  Filed Vitals:   06/18/14 1100  BP: 110/66  Pulse: 86  Temp: 98 F (36.7 C)  Resp:  7   68 year old female in no acute distress.   HEENT:  Nares- clear.  Oropharynx - without lesions. NECK:  Supple.  Nontender.  No audible bruit.  HEART:  Appears to be regular. LUNGS:  No crackles or wheezing audible.  Respirations even and unlabored.  RADIAL PULSE:  Equal bilaterally.   ABDOMEN:  Soft, nontender.  Bowel sounds present and normal.  No audible abdominal bruit.   EXTREMITIES:  No increased edema present.  MSK:  Increased pain with attempting to make a fist.  Some enlargement - DIPs.  Minimal increased pain to palpation - fingers.         Assessment & Plan:  CARDIOVASCULAR.  ECHO 05/23/10 revealed EF 60% with mild left atrial enlargement and moderate mitral insufficiency and mild tricuspid insufficiency.  Sees Dr Sharlet Salina [facility] cardiology).  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  Currently asymptomatic.    PULMONARY.  Breathing stable.  Follow.   HEALTH MAINTENANCE.  Physical 10/15/13.  Is s/p hysterectomy and does not require yearly pap  smears.  Colonoscopy as outlined.  Being followed by oncology for her breast cancer.

## 2014-06-20 NOTE — Assessment & Plan Note (Signed)
Brought in no sugar readings.  States under good control.   Follow metabolic panel and Q9I.  Aware prednisone will increase sugars.  Follow.

## 2014-06-20 NOTE — Assessment & Plan Note (Signed)
S/p biopsy.  Benign.  Follow.   Planning to follow up at the cancer center next month.

## 2014-06-20 NOTE — Assessment & Plan Note (Signed)
Has been seeing Dr Sharlet Salina.  MRI revealed herniated disc.  Having increased pain and increased left leg numbness/tingling.  Saw Dr Marcos Eke.  Is s/p surgery.  Still with pain.

## 2014-06-20 NOTE — Assessment & Plan Note (Signed)
Bilateral hand stiffness.  Better with movement.  Medrol dose pack as directed.  Check ESR and rheum panel (especially given history of pericarditis).    

## 2014-06-20 NOTE — Assessment & Plan Note (Signed)
On protonix.  No reflux reported today.  Follow.  Follow for any stomach irritation from the prednisone.

## 2014-06-21 ENCOUNTER — Encounter: Payer: Self-pay | Admitting: Internal Medicine

## 2014-06-21 LAB — SEDIMENTATION RATE: Sed Rate: 8 mm/hr (ref 0–22)

## 2014-06-21 LAB — ANA: Anti Nuclear Antibody(ANA): NEGATIVE

## 2014-06-22 DIAGNOSIS — IMO0002 Reserved for concepts with insufficient information to code with codable children: Secondary | ICD-10-CM | POA: Diagnosis not present

## 2014-06-22 DIAGNOSIS — Z79899 Other long term (current) drug therapy: Secondary | ICD-10-CM | POA: Diagnosis not present

## 2014-07-06 NOTE — Telephone Encounter (Signed)
Unread mychart message mailed to patient 

## 2014-07-09 DIAGNOSIS — M859 Disorder of bone density and structure, unspecified: Secondary | ICD-10-CM | POA: Diagnosis not present

## 2014-07-09 DIAGNOSIS — M810 Age-related osteoporosis without current pathological fracture: Secondary | ICD-10-CM | POA: Diagnosis not present

## 2014-07-09 DIAGNOSIS — M8588 Other specified disorders of bone density and structure, other site: Secondary | ICD-10-CM | POA: Diagnosis not present

## 2014-07-30 ENCOUNTER — Ambulatory Visit (INDEPENDENT_AMBULATORY_CARE_PROVIDER_SITE_OTHER): Payer: Medicare Other | Admitting: Internal Medicine

## 2014-07-30 ENCOUNTER — Encounter: Payer: Self-pay | Admitting: Internal Medicine

## 2014-07-30 VITALS — BP 120/58 | HR 109 | Temp 98.0°F | Ht 64.0 in | Wt 160.5 lb

## 2014-07-30 DIAGNOSIS — G8929 Other chronic pain: Secondary | ICD-10-CM

## 2014-07-30 DIAGNOSIS — C50919 Malignant neoplasm of unspecified site of unspecified female breast: Secondary | ICD-10-CM

## 2014-07-30 DIAGNOSIS — M25649 Stiffness of unspecified hand, not elsewhere classified: Secondary | ICD-10-CM

## 2014-07-30 DIAGNOSIS — K219 Gastro-esophageal reflux disease without esophagitis: Secondary | ICD-10-CM | POA: Diagnosis not present

## 2014-07-30 DIAGNOSIS — M549 Dorsalgia, unspecified: Secondary | ICD-10-CM | POA: Diagnosis not present

## 2014-07-30 DIAGNOSIS — Z86010 Personal history of colonic polyps: Secondary | ICD-10-CM | POA: Diagnosis not present

## 2014-07-30 DIAGNOSIS — E119 Type 2 diabetes mellitus without complications: Secondary | ICD-10-CM

## 2014-07-30 DIAGNOSIS — E7800 Pure hypercholesterolemia, unspecified: Secondary | ICD-10-CM

## 2014-07-30 DIAGNOSIS — I10 Essential (primary) hypertension: Secondary | ICD-10-CM | POA: Diagnosis not present

## 2014-07-30 MED ORDER — VENLAFAXINE HCL ER 150 MG PO CP24: 150.0000 mg | ORAL_CAPSULE | Freq: Every day | ORAL | Status: DC

## 2014-07-30 NOTE — Progress Notes (Signed)
Pre visit review using our clinic review tool, if applicable. No additional management support is needed unless otherwise documented below in the visit note. 

## 2014-08-01 ENCOUNTER — Other Ambulatory Visit: Payer: Self-pay | Admitting: Internal Medicine

## 2014-08-02 ENCOUNTER — Encounter: Payer: Self-pay | Admitting: Internal Medicine

## 2014-08-02 DIAGNOSIS — C50911 Malignant neoplasm of unspecified site of right female breast: Secondary | ICD-10-CM | POA: Diagnosis not present

## 2014-08-02 DIAGNOSIS — M810 Age-related osteoporosis without current pathological fracture: Secondary | ICD-10-CM | POA: Diagnosis not present

## 2014-08-02 NOTE — Progress Notes (Signed)
Subjective:    Patient ID: Sherry Simon, female    DOB: March 09, 1946, 68 y.o.   MRN: [id number]  Hand Pain   68 year old female with past history of tachycardia, palpitations, anxiety/depression, diabetes, hypertension and pericarditis of unknown origin.  She comes in today for a scheduled follow up. Has known back pain.  Has been seeing Dr Sharlet Salina.  S/p injections.  Helped some.   Has two torn rotator cuffs.  Takes vicodin and neurontin.   Has a herniated disc.  Was having some increased left leg numbness.  Saw Dr Marcos Eke - [facility].  Is s/p surgery.  Did not help her back.  Still having issues with her back and leg.  States that she has now developed bilateral hand stiffness and pain.  Unable to make a fist.  Hurts to try to grip.  Unable to open a bottle top.  Worse in the am.  I tried a prednisone taper last visit.  States did not make a big difference with her symptoms.  She also has noticed having problems with her left third finger getting "stuck".  We discussed further w/up and evaluation.  Discussed referral to rheumatology.  She is due to see her oncologist Monday and desires to discuss this with her.  Also had bone density two weeks ago.  Plans to discuss results at this appt as well.  Still with increased stress.       Past Medical History  Diagnosis Date  . Hypertension   . Diabetes mellitus   . Hypercholesterolemia   . Endometriosis     requiring hysterectomy  . Nephrolithiasis   . Anxiety and depression   . Pericarditis     recurrent, unkown origin  . Tachycardia   . Depression   . History of chicken pox   . Colon polyps     Current Outpatient Prescriptions on File Prior to Visit  Medication Sig Dispense Refill  . acyclovir cream (ZOVIRAX) 5 % Use as directed 15 g 0  . anastrozole (ARIMIDEX) 1 MG tablet Take 1 mg by mouth daily.    Marland Kitchen aspirin 81 MG tablet Take 81 mg by mouth daily.    Marland Kitchen atorvastatin (LIPITOR) 40 MG tablet TAKE 1 TABLET BY MOUTH EVERY DAY 90 tablet 1   . gabapentin (NEURONTIN) 300 MG capsule 300 mg. Take 3 capsules q hs    . latanoprost (XALATAN) 0.005 % ophthalmic solution Place 1 drop into the left eye at bedtime.    Marland Kitchen losartan (COZAAR) 100 MG tablet TAKE 1 TABLET (100 MG TOTAL) BY MOUTH DAILY. 30 tablet 5  . metFORMIN (GLUCOPHAGE) 500 MG tablet TAKE 1 TABLET BY MOUTH EVERY MORNING AND TAKE 1 TABLET AT SUPPER    . ONE TOUCH ULTRA TEST test strip TEST BLOOD SUGAR TWICE A DAY 200 each PRN  . oxyCODONE (OXY IR/ROXICODONE) 5 MG immediate release tablet May take up 3 tablets at a time every 6 hours    . pantoprazole (PROTONIX) 40 MG tablet TAKE 1 TABLET BY MOUTH TWICE A DAY 60 tablet 5   No current facility-administered medications on file prior to visit.    Review of Systems No chest pain, tightness or palpitations.   Breathing stable.  No nausea or vomiting.  No abdominal pain or cramping.  Persistent back and leg pain as outlined.  Hand stiffness and pain as outlined.  Eating and drinking well.  Bowels stable.        Objective:   Physical Exam  Filed Vitals:   07/30/14 1435  BP: 120/58  Pulse: 109  Temp: 98 F (16.76 C)   67 year old female in no acute distress.   HEENT:  Nares- clear.  Oropharynx - without lesions. NECK:  Supple.  Nontender.  No audible bruit.  HEART:  Appears to be regular. LUNGS:  No crackles or wheezing audible.  Respirations even and unlabored.  RADIAL PULSE:  Equal bilaterally.   ABDOMEN:  Soft, nontender.  Bowel sounds present and normal.  No audible abdominal bruit.   EXTREMITIES:  No increased edema present.  MSK:  Increased pain with attempting to make a fist.          Assessment & Plan:  CARDIOVASCULAR.  ECHO 05/23/10 revealed EF 60% with mild left atrial enlargement and moderate mitral insufficiency and mild tricuspid insufficiency.  Sees Dr Sharlet Salina [facility] cardiology).  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  Currently asymptomatic.    PULMONARY.  Breathing stable.   Follow.   Essential hypertension Blood pressure doing well.  Follow metabolic panel.   Type 2 diabetes mellitus without complication Sugars have been doing well.  Continue low carb diet.  Follow met b and a1c. Keep up to date with eye checks.   Lab Results  Component Value Date   HGBA1C 6.6* 03/19/2014   Gastroesophageal reflux disease, esophagitis presence not specified Controlled.  On protonix.   Chronic back pain Seeing Dr Sharlet Salina.  Injections helped some.  Follow.   Hypercholesterolemia Low cholesterol diet and exercise.  Follow lipid panel.  Lab Results  Component Value Date   CHOL 159 03/19/2014   HDL 56.40 03/19/2014   LDLCALC 81 03/19/2014   TRIG 109.0 03/19/2014   CHOLHDL 3 03/19/2014   Breast cancer, unspecified laterality Is being followed by [facility].  S/p XRT.    History of colonic polyps Up to date with colonoscopy.  Bowels stable.    Joint stiffness of hand, unspecified laterality ESR and CRP wnl.  Did not respond significantly to prednisone. Discussed referral to rheumatology.  She wants to discuss with her oncologist.  Follow.   HEALTH MAINTENANCE.  Physical 10/15/13.  Is s/p hysterectomy and does not require yearly pap smears.  Colonoscopy as outlined.  Being followed by oncology for her breast cancer.

## 2014-08-13 ENCOUNTER — Other Ambulatory Visit (INDEPENDENT_AMBULATORY_CARE_PROVIDER_SITE_OTHER): Payer: Medicare Other

## 2014-08-13 DIAGNOSIS — E7800 Pure hypercholesterolemia, unspecified: Secondary | ICD-10-CM

## 2014-08-13 DIAGNOSIS — C50919 Malignant neoplasm of unspecified site of unspecified female breast: Secondary | ICD-10-CM

## 2014-08-13 DIAGNOSIS — E119 Type 2 diabetes mellitus without complications: Secondary | ICD-10-CM

## 2014-08-13 DIAGNOSIS — I10 Essential (primary) hypertension: Secondary | ICD-10-CM | POA: Diagnosis not present

## 2014-08-13 LAB — HEPATIC FUNCTION PANEL
ALBUMIN: 4 g/dL (ref 3.5–5.2)
ALT: 14 U/L (ref 0–35)
AST: 14 U/L (ref 0–37)
Alkaline Phosphatase: 118 U/L — ABNORMAL HIGH (ref 39–117)
BILIRUBIN TOTAL: 1.1 mg/dL (ref 0.2–1.2)
Bilirubin, Direct: 0.2 mg/dL (ref 0.0–0.3)
Total Protein: 6.3 g/dL (ref 6.0–8.3)

## 2014-08-13 LAB — BASIC METABOLIC PANEL
BUN: 12 mg/dL (ref 6–23)
CHLORIDE: 106 meq/L (ref 96–112)
CO2: 25 meq/L (ref 19–32)
Calcium: 9.2 mg/dL (ref 8.4–10.5)
Creatinine, Ser: 0.6 mg/dL (ref 0.4–1.2)
GFR: 109.74 mL/min (ref >60.00)
GLUCOSE: 103 mg/dL — AB (ref 70–99)
Potassium: 4.2 mEq/L (ref 3.5–5.1)
SODIUM: 138 meq/L (ref 135–145)

## 2014-08-13 LAB — LIPID PANEL
CHOL/HDL RATIO: 3
Cholesterol: 138 mg/dL (ref 0–200)
HDL: 48 mg/dL (ref >39.00)
LDL Cholesterol: 80 mg/dL (ref 0–99)
NONHDL: 90
Triglycerides: 52 mg/dL (ref 0.0–149.0)
VLDL: 10.4 mg/dL (ref 0.0–40.0)

## 2014-08-13 LAB — HEMOGLOBIN A1C: Hgb A1c MFr Bld: 6.6 % — ABNORMAL HIGH (ref 4.6–6.5)

## 2014-08-13 LAB — MICROALBUMIN / CREATININE URINE RATIO
CREATININE, U: 54.9 mg/dL
Microalb Creat Ratio: 0.9 mg/g (ref 0.0–30.0)
Microalb, Ur: 0.5 mg/dL (ref 0.0–1.9)

## 2014-08-13 NOTE — Addendum Note (Signed)
Addended by: Johnsie Cancel on: 08/13/2014 09:12 AM   Modules accepted: Orders

## 2014-08-14 ENCOUNTER — Encounter: Payer: Self-pay | Admitting: Internal Medicine

## 2014-08-16 DIAGNOSIS — M5136 Other intervertebral disc degeneration, lumbar region: Secondary | ICD-10-CM | POA: Diagnosis not present

## 2014-08-16 DIAGNOSIS — M5416 Radiculopathy, lumbar region: Secondary | ICD-10-CM | POA: Diagnosis not present

## 2014-08-17 NOTE — Telephone Encounter (Signed)
Unread mychart message mailed to patient 

## 2014-08-30 DIAGNOSIS — C50911 Malignant neoplasm of unspecified site of right female breast: Secondary | ICD-10-CM | POA: Diagnosis not present

## 2014-08-30 DIAGNOSIS — M810 Age-related osteoporosis without current pathological fracture: Secondary | ICD-10-CM | POA: Diagnosis not present

## 2014-09-20 ENCOUNTER — Other Ambulatory Visit: Payer: Self-pay | Admitting: Internal Medicine

## 2014-09-28 DIAGNOSIS — M5416 Radiculopathy, lumbar region: Secondary | ICD-10-CM | POA: Diagnosis not present

## 2014-09-28 DIAGNOSIS — M5136 Other intervertebral disc degeneration, lumbar region: Secondary | ICD-10-CM | POA: Diagnosis not present

## 2014-09-28 DIAGNOSIS — M5117 Intervertebral disc disorders with radiculopathy, lumbosacral region: Secondary | ICD-10-CM | POA: Diagnosis not present

## 2014-09-28 DIAGNOSIS — M5417 Radiculopathy, lumbosacral region: Secondary | ICD-10-CM | POA: Diagnosis not present

## 2014-09-30 DIAGNOSIS — Z961 Presence of intraocular lens: Secondary | ICD-10-CM | POA: Diagnosis not present

## 2014-09-30 DIAGNOSIS — H409 Unspecified glaucoma: Secondary | ICD-10-CM | POA: Diagnosis not present

## 2014-10-14 DIAGNOSIS — H3322 Serous retinal detachment, left eye: Secondary | ICD-10-CM | POA: Diagnosis not present

## 2014-10-14 DIAGNOSIS — H35052 Retinal neovascularization, unspecified, left eye: Secondary | ICD-10-CM | POA: Diagnosis not present

## 2014-10-14 DIAGNOSIS — E119 Type 2 diabetes mellitus without complications: Secondary | ICD-10-CM | POA: Diagnosis not present

## 2014-10-14 DIAGNOSIS — H35372 Puckering of macula, left eye: Secondary | ICD-10-CM | POA: Diagnosis not present

## 2014-11-04 ENCOUNTER — Ambulatory Visit (INDEPENDENT_AMBULATORY_CARE_PROVIDER_SITE_OTHER): Payer: Medicare Other | Admitting: Internal Medicine

## 2014-11-04 ENCOUNTER — Encounter: Payer: Self-pay | Admitting: Internal Medicine

## 2014-11-04 VITALS — BP 123/73 | HR 84 | Temp 97.9°F | Ht 64.0 in | Wt 157.1 lb

## 2014-11-04 DIAGNOSIS — E7800 Pure hypercholesterolemia, unspecified: Secondary | ICD-10-CM

## 2014-11-04 DIAGNOSIS — G8929 Other chronic pain: Secondary | ICD-10-CM

## 2014-11-04 DIAGNOSIS — Z658 Other specified problems related to psychosocial circumstances: Secondary | ICD-10-CM | POA: Diagnosis not present

## 2014-11-04 DIAGNOSIS — K219 Gastro-esophageal reflux disease without esophagitis: Secondary | ICD-10-CM

## 2014-11-04 DIAGNOSIS — E041 Nontoxic single thyroid nodule: Secondary | ICD-10-CM

## 2014-11-04 DIAGNOSIS — C50919 Malignant neoplasm of unspecified site of unspecified female breast: Secondary | ICD-10-CM

## 2014-11-04 DIAGNOSIS — I10 Essential (primary) hypertension: Secondary | ICD-10-CM | POA: Diagnosis not present

## 2014-11-04 DIAGNOSIS — E119 Type 2 diabetes mellitus without complications: Secondary | ICD-10-CM

## 2014-11-04 DIAGNOSIS — M75100 Unspecified rotator cuff tear or rupture of unspecified shoulder, not specified as traumatic: Secondary | ICD-10-CM | POA: Diagnosis not present

## 2014-11-04 DIAGNOSIS — Z86010 Personal history of colonic polyps: Secondary | ICD-10-CM | POA: Diagnosis not present

## 2014-11-04 DIAGNOSIS — M549 Dorsalgia, unspecified: Secondary | ICD-10-CM | POA: Diagnosis not present

## 2014-11-04 DIAGNOSIS — F439 Reaction to severe stress, unspecified: Secondary | ICD-10-CM

## 2014-11-04 MED ORDER — ACYCLOVIR 5 % EX CREA: TOPICAL_CREAM | CUTANEOUS | Status: DC

## 2014-11-04 MED ORDER — PANTOPRAZOLE SODIUM 40 MG PO TBEC: 40.0000 mg | DELAYED_RELEASE_TABLET | Freq: Two times a day (BID) | ORAL | Status: DC

## 2014-11-04 MED ORDER — VENLAFAXINE HCL ER 150 MG PO CP24: 150.0000 mg | ORAL_CAPSULE | Freq: Every day | ORAL | Status: DC

## 2014-11-04 NOTE — Progress Notes (Signed)
Patient ID: Sherry Simon, female   DOB: 1945-11-11, 69 y.o.   MRN: [id number]   Subjective:    Patient ID: Sherry Simon, female    DOB: 1946-04-11, 69 y.o.   MRN: [id number]  HPI  Patient here for her physical exam.  States she is under increased stress.  Some increased depression.  She is dealing with chronic pain.  Having to take care of her husband - who has MS.  Increased family stress.  Also dealing with the death of three close friends.  No suicidal ideations.  Increased shoulder pain.  Increased back pain.  Has seen Dr Sharlet Salina previously.  Wants to see him again for pain management.  Her shoulders hurt and her hands are stiff.  Heart appears to be stable.     Past Medical History  Diagnosis Date  . Hypertension   . Diabetes mellitus   . Hypercholesterolemia   . Endometriosis     requiring hysterectomy  . Nephrolithiasis   . Anxiety and depression   . Pericarditis     recurrent, unkown origin  . Tachycardia   . Depression   . History of chicken pox   . Colon polyps     Current Outpatient Prescriptions on File Prior to Visit  Medication Sig Dispense Refill  . anastrozole (ARIMIDEX) 1 MG tablet Take 1 mg by mouth daily.    Marland Kitchen aspirin 81 MG tablet Take 81 mg by mouth daily.    Marland Kitchen atorvastatin (LIPITOR) 40 MG tablet TAKE 1 TABLET BY MOUTH EVERY DAY 90 tablet 1  . gabapentin (NEURONTIN) 300 MG capsule 300 mg. Take 3 capsules q hs    . latanoprost (XALATAN) 0.005 % ophthalmic solution Place 1 drop into the left eye at bedtime.    Marland Kitchen losartan (COZAAR) 100 MG tablet TAKE 1 TABLET (100 MG TOTAL) BY MOUTH DAILY. 30 tablet 5  . metFORMIN (GLUCOPHAGE) 500 MG tablet TAKE 2 TABLETS BY MOUTH EVERY MORNING AND TAKE 1 TABLET AT SUPPER 270 tablet 1  . ONE TOUCH ULTRA TEST test strip TEST BLOOD SUGAR TWICE A DAY 200 each PRN  . oxyCODONE (OXY IR/ROXICODONE) 5 MG immediate release tablet May take up 3 tablets at a time every 6 hours     No current facility-administered medications on  file prior to visit.    Review of Systems  Constitutional: Positive for fatigue. Negative for unexpected weight change.  HENT: Negative for congestion, sinus pressure and sore throat.   Eyes: Negative for pain and visual disturbance.  Respiratory: Negative for cough, chest tightness and shortness of breath.   Cardiovascular: Negative for chest pain, palpitations and leg swelling.  Gastrointestinal: Negative for abdominal pain, diarrhea and constipation.  Genitourinary: Negative for frequency and difficulty urinating.  Musculoskeletal: Positive for back pain (increased back pain.  ) and neck pain (some left neck pain with rotation of her head.  ). Negative for joint swelling.  Skin: Negative for color change and rash.  Neurological: Negative for dizziness, light-headedness and headaches.  Hematological: Negative for adenopathy. Does not bruise/bleed easily.  Psychiatric/Behavioral: Negative for decreased concentration.       Increased stress and depression.  Stress as outlined.         Objective:     Blood pressure recheck:  128/62  Physical Exam  Constitutional: She is oriented to person, place, and time. She appears well-developed and well-nourished.  HENT:  Nose: Nose normal.  Mouth/Throat: Oropharynx is clear and moist.  Eyes: Right eye exhibits no  discharge. Left eye exhibits no discharge. No scleral icterus.  Neck: Neck supple. No thyromegaly present.  Cardiovascular: Normal rate and regular rhythm.   Pulmonary/Chest: Breath sounds normal. No accessory muscle usage. No tachypnea. No respiratory distress. She has no decreased breath sounds. She has no wheezes. She has no rhonchi. Right breast exhibits no inverted nipple, no mass, no nipple discharge and no tenderness (no axillary adenopathy). Left breast exhibits no inverted nipple, no mass, no nipple discharge and no tenderness (no axilarry adenopathy).  Abdominal: Soft. Bowel sounds are normal. There is no tenderness.    Musculoskeletal: She exhibits no edema or tenderness.  Lymphadenopathy:    She has no cervical adenopathy.  Neurological: She is alert and oriented to person, place, and time.  Skin: Skin is warm. No rash noted.  Psychiatric: She has a normal mood and affect. Her behavior is normal.    BP 123/73 mmHg  Pulse 84  Temp(Src) 97.9 F (36.6 C) (Oral)  Ht $R'5\' 4"'ih$  (1.626 m)  Wt 157 lb 2 oz (71.271 kg)  BMI 26.96 kg/m2  SpO2 98% Wt Readings from Last 3 Encounters:  11/04/14 157 lb 2 oz (71.271 kg)  07/30/14 160 lb 8 oz (72.802 kg)  06/18/14 157 lb 12 oz (71.555 kg)     Lab Results  Component Value Date   WBC 5.2 06/18/2014   HGB 12.1 06/18/2014   HCT 36.7 06/18/2014   PLT 319.0 06/18/2014   GLUCOSE 103* 08/13/2014   CHOL 138 08/13/2014   TRIG 52.0 08/13/2014   HDL 48.00 08/13/2014   LDLCALC 80 08/13/2014   ALT 14 08/13/2014   AST 14 08/13/2014   NA 138 08/13/2014   K 4.2 08/13/2014   CL 106 08/13/2014   CREATININE 0.6 08/13/2014   BUN 12 08/13/2014   CO2 25 08/13/2014   TSH 0.46 03/19/2014   HGBA1C 6.6* 08/13/2014   MICROALBUR 0.5 08/13/2014       Assessment & Plan:   Problem List Items Addressed This Visit    Breast cancer    Followed by oncology.  States she is up to date with her mammograms.  Continue f/u with oncology.        Relevant Orders   Hepatic function panel   Chronic back pain    Is s/p lumbar decompressive surgery - 5/15.  Increased back pain.  Refer back to Dr Sharlet Salina.        Relevant Orders   Ambulatory referral to Orthopedic Surgery   Diabetes mellitus    Low carb diet and exercise.  Follow met b and a1c.        Relevant Orders   Hemoglobin A1c   GERD (gastroesophageal reflux disease)    Symptoms controlled on protonix.  Follow.        Relevant Medications   pantoprazole (PROTONIX) EC tablet   History of colonic polyps    Colonoscopy 07/24/10 - 2 mm polyp in the ascending colon and diverticulosis.  Bowels stable.         Hypercholesterolemia    Low cholesterol diet and exercise.  Follow lipid panel.        Relevant Orders   Lipid panel   Hypertension - Primary    Blood pressure doing well.  Same medication regimen.  Follow.        Relevant Orders   Basic metabolic panel   Rotator cuff tear    With increased shoulder pain.  She has to help take care of her husband.  Desires  no further intervention.  Follow.        Stress    Increased stress as outlined.  Discussed at length with her today.  Will refer to Dr Nicolasa Ducking for further evaluation and treatment.        Relevant Orders   Ambulatory referral to Psychiatry   Thyroid nodule    S/p biopsy.  Benign.  Follow thyroid function.        Relevant Orders   TSH   T3, free   T4, free       Val Schiavo, Randell Patient, MD

## 2014-11-04 NOTE — Progress Notes (Signed)
Pre visit review using our clinic review tool, if applicable. No additional management support is needed unless otherwise documented below in the visit note. 

## 2014-11-05 DIAGNOSIS — M65321 Trigger finger, right index finger: Secondary | ICD-10-CM | POA: Diagnosis not present

## 2014-11-05 DIAGNOSIS — M65331 Trigger finger, right middle finger: Secondary | ICD-10-CM | POA: Diagnosis not present

## 2014-11-05 DIAGNOSIS — M65332 Trigger finger, left middle finger: Secondary | ICD-10-CM | POA: Diagnosis not present

## 2014-11-07 ENCOUNTER — Encounter: Payer: Self-pay | Admitting: Internal Medicine

## 2014-11-07 DIAGNOSIS — F439 Reaction to severe stress, unspecified: Secondary | ICD-10-CM | POA: Insufficient documentation

## 2014-11-07 NOTE — Assessment & Plan Note (Signed)
With increased shoulder pain.  She has to help take care of her husband.  Desires no further intervention.  Follow.

## 2014-11-07 NOTE — Assessment & Plan Note (Signed)
Increased stress as outlined.  Discussed at length with her today.  Will refer to Dr Nicolasa Ducking for further evaluation and treatment.

## 2014-11-07 NOTE — Assessment & Plan Note (Signed)
Low cholesterol diet and exercise.  Follow lipid panel.   

## 2014-11-07 NOTE — Assessment & Plan Note (Signed)
Low carb diet and exercise.  Follow met b and a1c.   

## 2014-11-07 NOTE — Assessment & Plan Note (Signed)
Blood pressure doing well.  Same medication regimen.  Follow.    

## 2014-11-07 NOTE — Assessment & Plan Note (Signed)
S/p biopsy.  Benign.  Follow thyroid function.

## 2014-11-07 NOTE — Assessment & Plan Note (Signed)
Is s/p lumbar decompressive surgery - 5/15.  Increased back pain.  Refer back to Dr Sharlet Salina.

## 2014-11-07 NOTE — Assessment & Plan Note (Signed)
Followed by oncology.  States she is up to date with her mammograms.  Continue f/u with oncology.

## 2014-11-07 NOTE — Assessment & Plan Note (Signed)
Symptoms controlled on protonix.  Follow.   

## 2014-11-07 NOTE — Assessment & Plan Note (Signed)
Colonoscopy 07/24/10 - 2 mm polyp in the ascending colon and diverticulosis.  Bowels stable.

## 2014-11-09 ENCOUNTER — Other Ambulatory Visit: Payer: Commercial Managed Care - PPO

## 2014-11-16 ENCOUNTER — Other Ambulatory Visit (INDEPENDENT_AMBULATORY_CARE_PROVIDER_SITE_OTHER): Payer: Medicare Other

## 2014-11-16 DIAGNOSIS — I10 Essential (primary) hypertension: Secondary | ICD-10-CM | POA: Diagnosis not present

## 2014-11-16 DIAGNOSIS — C50919 Malignant neoplasm of unspecified site of unspecified female breast: Secondary | ICD-10-CM | POA: Diagnosis not present

## 2014-11-16 DIAGNOSIS — E119 Type 2 diabetes mellitus without complications: Secondary | ICD-10-CM

## 2014-11-16 DIAGNOSIS — E7800 Pure hypercholesterolemia, unspecified: Secondary | ICD-10-CM

## 2014-11-16 DIAGNOSIS — E041 Nontoxic single thyroid nodule: Secondary | ICD-10-CM | POA: Diagnosis not present

## 2014-11-16 LAB — LIPID PANEL
CHOL/HDL RATIO: 3
Cholesterol: 144 mg/dL (ref 0–200)
HDL: 53 mg/dL (ref >39.00)
LDL CALC: 80 mg/dL (ref 0–99)
NonHDL: 91
Triglycerides: 54 mg/dL (ref 0.0–149.0)
VLDL: 10.8 mg/dL (ref 0.0–40.0)

## 2014-11-16 LAB — HEPATIC FUNCTION PANEL
ALBUMIN: 4.2 g/dL (ref 3.5–5.2)
ALK PHOS: 84 U/L (ref 39–117)
ALT: 12 U/L (ref 0–35)
AST: 12 U/L (ref 0–37)
Bilirubin, Direct: 0.2 mg/dL (ref 0.0–0.3)
Total Bilirubin: 0.7 mg/dL (ref 0.2–1.2)
Total Protein: 6.4 g/dL (ref 6.0–8.3)

## 2014-11-16 LAB — HEMOGLOBIN A1C: Hgb A1c MFr Bld: 6.6 % — ABNORMAL HIGH (ref 4.6–6.5)

## 2014-11-16 LAB — T3, FREE: T3, Free: 3.3 pg/mL (ref 2.3–4.2)

## 2014-11-16 LAB — BASIC METABOLIC PANEL
BUN: 12 mg/dL (ref 6–23)
CO2: 29 mEq/L (ref 19–32)
Calcium: 9.4 mg/dL (ref 8.4–10.5)
Chloride: 103 mEq/L (ref 96–112)
Creatinine, Ser: 0.63 mg/dL (ref 0.40–1.20)
GFR: 99.67 mL/min (ref >60.00)
GLUCOSE: 101 mg/dL — AB (ref 70–99)
POTASSIUM: 4.6 meq/L (ref 3.5–5.1)
SODIUM: 138 meq/L (ref 135–145)

## 2014-11-16 LAB — T4, FREE: Free T4: 0.93 ng/dL (ref 0.60–1.60)

## 2014-11-16 LAB — TSH: TSH: 1.6 u[IU]/mL (ref 0.35–4.50)

## 2014-11-17 ENCOUNTER — Other Ambulatory Visit: Payer: Commercial Managed Care - PPO

## 2014-11-19 ENCOUNTER — Encounter: Payer: Self-pay | Admitting: Internal Medicine

## 2014-11-23 NOTE — Telephone Encounter (Signed)
Unread mychart message mailed to patient 

## 2014-12-13 DIAGNOSIS — Z1283 Encounter for screening for malignant neoplasm of skin: Secondary | ICD-10-CM | POA: Diagnosis not present

## 2014-12-13 DIAGNOSIS — D1801 Hemangioma of skin and subcutaneous tissue: Secondary | ICD-10-CM | POA: Diagnosis not present

## 2014-12-13 DIAGNOSIS — L570 Actinic keratosis: Secondary | ICD-10-CM | POA: Diagnosis not present

## 2014-12-13 DIAGNOSIS — L821 Other seborrheic keratosis: Secondary | ICD-10-CM | POA: Diagnosis not present

## 2014-12-13 DIAGNOSIS — L728 Other follicular cysts of the skin and subcutaneous tissue: Secondary | ICD-10-CM | POA: Diagnosis not present

## 2014-12-13 DIAGNOSIS — Z85828 Personal history of other malignant neoplasm of skin: Secondary | ICD-10-CM | POA: Diagnosis not present

## 2014-12-13 DIAGNOSIS — L853 Xerosis cutis: Secondary | ICD-10-CM | POA: Diagnosis not present

## 2014-12-13 DIAGNOSIS — Z08 Encounter for follow-up examination after completed treatment for malignant neoplasm: Secondary | ICD-10-CM | POA: Diagnosis not present

## 2014-12-16 ENCOUNTER — Telehealth: Payer: Self-pay | Admitting: Internal Medicine

## 2014-12-16 NOTE — Telephone Encounter (Signed)
Patient refused ER, Urgent care and [facility], stating just could not go to either one that she is to [employer]

## 2014-12-16 NOTE — Telephone Encounter (Signed)
Spoke to pt, states these episodes do not occur all the time, most recent was 2 days ago and sees no reason to be seen anywhere today when she is not having symptoms. States no symptoms today. States she just called because Dr. Nicki Reaper had told her to call because she could work her in sometime to discuss these episodes and what further testing needed to be done. Advised that she does need to be seen somewhere, ED, UC when the episode is occuring,  verbalized understanding. Pt just wanting appt with Dr. Nicki Reaper to discuss, refuses to be seen anywhere today for no symptoms.

## 2014-12-16 NOTE — Telephone Encounter (Signed)
Given "new sob" and palpitations with dizziness, I do agree with the need for evaluation today.  If refuses ER evaluation, then recommend [facility] or [facility] care.  They can do stat labs, etc if needed.

## 2014-12-16 NOTE — Telephone Encounter (Signed)
Patient Name: Sherry Simon DOB: 06-25-1946 Initial Comment Caller states she is having dizzy spells. Nurse Assessment Nurse: Vallery Sa, RN, Cathy Date/Time (Eastern Time): 12/16/2014 11:24:26 AM Confirm and document reason for call. If symptomatic, describe symptoms. ---Caller states she developed heart palpitations and dizziness a few days ago. She has been having shortness of breath at times. No injury in the past 3 days. No fever. No severe breathing difficulty at this time. Has the patient traveled out of the country within the last 30 days? ---No Does the patient require triage? ---Yes Related visit to physician within the last 2 weeks? ---No Does the PT have any chronic conditions? (i.e. diabetes, asthma, etc.) ---Yes List chronic conditions. ---High Blood Pressure, Diabetes, Osteoporosis, Chronic Pericarditis Guidelines Guideline Title Affirmed Question Affirmed Notes Breathing Difficulty [1] MODERATE difficulty breathing (e.g., speaks in phrases, SOB even at rest, pulse 100-120) AND [2] NEW-onset or WORSE than normal Final Disposition User Go to ED Now [place], RN, [employer] shared she is having some new shortness of breath along with the heart palpitations and dizziness. She declined the Go to ER disposition. Reinforced the Go to ER disposition. 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 Called the office backline and was transferred to an answering machine. Called the office backline and notified Cathy.

## 2014-12-16 NOTE — Telephone Encounter (Signed)
Duplicate.  See other phone message.

## 2014-12-16 NOTE — Telephone Encounter (Signed)
Please explain to pt that I worked through lunch, etc.  Still seeing pts.  Want to make sure she is ok.  Make sure doing better.  Really feel needs to be evaluated if persistent symptoms.

## 2014-12-16 NOTE — Telephone Encounter (Signed)
Patient called [facility] with C/O  Shortness of breath, Palpitations, and dizziness : patient was advised by [facility] to go to ER now patient refused to go. Please advise.

## 2014-12-18 NOTE — Telephone Encounter (Signed)
See if she can come in at 9:00 on Tuesday (12/21/14) morning.  Will need 30 min appt.

## 2014-12-20 NOTE — Telephone Encounter (Signed)
Another pt already in 9:00 slot.  Pt unable to come in at that time.  Is she still having problems and is she still wanting an appt.  Will need to know if still acute symptoms.

## 2014-12-20 NOTE — Telephone Encounter (Signed)
Called pt, she stated she is unable to come in tomorrow at 9am due to having other plans.

## 2014-12-20 NOTE — Telephone Encounter (Signed)
Pt states that she is still having the sx's off & on (Shortness of breath, Palpitations, and dizziness) & she can come in any day other than tomorrow.

## 2014-12-20 NOTE — Telephone Encounter (Signed)
See message below about appointment

## 2014-12-21 NOTE — Telephone Encounter (Signed)
Pt wants to see you first

## 2014-12-21 NOTE — Telephone Encounter (Signed)
I can work her in this week, but if she is having increased palpitations, dizziness and sob - we are probably going to need to refer her to cardiology for further evaluation.  May need holter, etc.  Does she want me to schedule appt with them first.   May save her two trips.  If not, I will find a spot for her this week.

## 2014-12-22 ENCOUNTER — Ambulatory Visit (INDEPENDENT_AMBULATORY_CARE_PROVIDER_SITE_OTHER): Payer: Medicare Other | Admitting: Internal Medicine

## 2014-12-22 ENCOUNTER — Encounter: Payer: Self-pay | Admitting: Internal Medicine

## 2014-12-22 VITALS — BP 109/68 | HR 76 | Temp 98.1°F | Ht 64.0 in | Wt 154.0 lb

## 2014-12-22 DIAGNOSIS — R079 Chest pain, unspecified: Secondary | ICD-10-CM

## 2014-12-22 DIAGNOSIS — F439 Reaction to severe stress, unspecified: Secondary | ICD-10-CM

## 2014-12-22 DIAGNOSIS — Z658 Other specified problems related to psychosocial circumstances: Secondary | ICD-10-CM

## 2014-12-22 DIAGNOSIS — I10 Essential (primary) hypertension: Secondary | ICD-10-CM | POA: Diagnosis not present

## 2014-12-22 DIAGNOSIS — R002 Palpitations: Secondary | ICD-10-CM | POA: Diagnosis not present

## 2014-12-22 DIAGNOSIS — I499 Cardiac arrhythmia, unspecified: Secondary | ICD-10-CM | POA: Diagnosis not present

## 2014-12-22 NOTE — Telephone Encounter (Signed)
Melissa calling to see if patient can come in today around 11:30 instead (may need ekg also)

## 2014-12-22 NOTE — Patient Instructions (Signed)
Appointment 9:45 12/23/14 - [facility])

## 2014-12-22 NOTE — Telephone Encounter (Signed)
See if she can come in at 12:00 today.

## 2014-12-22 NOTE — Progress Notes (Signed)
Pre visit review using our clinic review tool, if applicable. No additional management support is needed unless otherwise documented below in the visit note. 

## 2014-12-23 ENCOUNTER — Encounter: Payer: Self-pay | Admitting: Internal Medicine

## 2014-12-23 DIAGNOSIS — R002 Palpitations: Secondary | ICD-10-CM | POA: Insufficient documentation

## 2014-12-23 DIAGNOSIS — E782 Mixed hyperlipidemia: Secondary | ICD-10-CM | POA: Diagnosis not present

## 2014-12-23 DIAGNOSIS — I209 Angina pectoris, unspecified: Secondary | ICD-10-CM | POA: Diagnosis not present

## 2014-12-23 DIAGNOSIS — R079 Chest pain, unspecified: Secondary | ICD-10-CM | POA: Insufficient documentation

## 2014-12-23 DIAGNOSIS — I10 Essential (primary) hypertension: Secondary | ICD-10-CM | POA: Diagnosis not present

## 2014-12-23 MED ORDER — LOSARTAN POTASSIUM 50 MG PO TABS: 50.0000 mg | ORAL_TABLET | Freq: Every day | ORAL | Status: DC

## 2014-12-23 NOTE — Progress Notes (Signed)
Patient ID: Sherry Simon, female   DOB: [birth year], 69 y.o.   MRN: [id number]    Subjective:    Patient ID: Sherry Simon, female    DOB: [birth year], 69 y.o.   MRN: [id number]  HPI  Patient here as a work in with concerns regarding increased palpitations, increased heart rate, light headedness and intermittent chest pain.  She reports that a few weeks ago, had chest pain that radiated down her left arm.  Woke her from sleep.  Lasted for approximately five minutes.  Resolved.  Went back to sleep.  Has had no further episodes like that, but has had some mild intermittent chest pains.  Also reports that over the last few weeks, has been having intermittent episodes of palpitations.  Will have associated light headedness and weakness.  Occurs with standing or after she has been up for a while.  Does not occur with sitting or lying down.  Occasional nausea.  Has been eating.  No vomiting.   Still with increased stress.  Has appt with psychiatry 12/2014.    Past Medical History  Diagnosis Date  . Hypertension   . Diabetes mellitus   . Hypercholesterolemia   . Endometriosis     requiring hysterectomy  . Nephrolithiasis   . Anxiety and depression   . Pericarditis     recurrent, unkown origin  . Tachycardia   . Depression   . History of chicken pox   . Colon polyps     Current Outpatient Prescriptions on File Prior to Visit  Medication Sig Dispense Refill  . acyclovir cream (ZOVIRAX) 5 % Use as directed 15 g 0  . anastrozole (ARIMIDEX) 1 MG tablet Take 1 mg by mouth daily.    Marland Kitchen aspirin 81 MG tablet Take 81 mg by mouth daily.    Marland Kitchen atorvastatin (LIPITOR) 40 MG tablet TAKE 1 TABLET BY MOUTH EVERY DAY 90 tablet 1  . gabapentin (NEURONTIN) 300 MG capsule 300 mg. Take 3 capsules q hs    . latanoprost (XALATAN) 0.005 % ophthalmic solution Place 1 drop into the left eye at bedtime.    . metFORMIN (GLUCOPHAGE) 500 MG tablet TAKE 2 TABLETS BY MOUTH EVERY MORNING AND TAKE 1 TABLET AT SUPPER  270 tablet 1  . ONE TOUCH ULTRA TEST test strip TEST BLOOD SUGAR TWICE A DAY 200 each PRN  . oxyCODONE (OXY IR/ROXICODONE) 5 MG immediate release tablet May take up 3 tablets at a time every 6 hours    . pantoprazole (PROTONIX) 40 MG tablet Take 1 tablet (40 mg total) by mouth 2 (two) times daily. 60 tablet 5  . venlafaxine XR (EFFEXOR XR) 150 MG 24 hr capsule Take 1 capsule (150 mg total) by mouth daily with breakfast. 30 capsule 5   No current facility-administered medications on file prior to visit.    Review of Systems  Constitutional: Positive for fatigue. Negative for appetite change and unexpected weight change.  HENT: Negative for congestion and sinus pressure.   Respiratory: Negative for cough and shortness of breath.   Cardiovascular: Positive for chest pain (chest pain as outlined.  ) and palpitations (associated with light headedness and weakness.). Negative for leg swelling.  Gastrointestinal: Positive for nausea. Negative for vomiting and abdominal pain.  Musculoskeletal: Positive for back pain (chronic).  Neurological: Positive for light-headedness. Negative for dizziness and headaches.  Psychiatric/Behavioral:       Increased stress as outlined.         Objective:  Blood pressure recheck:  116-118/72 standing and 122/78 lying.   Physical Exam  Constitutional: She appears well-developed and well-nourished. No distress.  HENT:  Nose: Nose normal.  Mouth/Throat: Oropharynx is clear and moist.  Neck: Neck supple. No thyromegaly present.  Cardiovascular: Normal rate and regular rhythm.   Pulmonary/Chest: Breath sounds normal. No respiratory distress. She has no wheezes.  Abdominal: Soft. Bowel sounds are normal. There is no tenderness.  Musculoskeletal: She exhibits no edema or tenderness.  Lymphadenopathy:    She has no cervical adenopathy.  Skin: No rash noted. No erythema.    BP 109/68 mmHg  Pulse 76  Temp(Src) 98.1 F (36.7 C) (Oral)  Ht 5\' 4"  (1.626 m)   Wt 154 lb (69.854 kg)  BMI 26.42 kg/m2  SpO2 98% Wt Readings from Last 3 Encounters:  12/22/14 154 lb (69.854 kg)  11/04/14 157 lb 2 oz (71.271 kg)  07/30/14 160 lb 8 oz (72.802 kg)     Lab Results  Component Value Date   WBC 5.2 06/18/2014   HGB 12.1 06/18/2014   HCT 36.7 06/18/2014   PLT 319.0 06/18/2014   GLUCOSE 101* 11/16/2014   CHOL 144 11/16/2014   TRIG 54.0 11/16/2014   HDL 53.00 11/16/2014   LDLCALC 80 11/16/2014   ALT 12 11/16/2014   AST 12 11/16/2014   NA 138 11/16/2014   K 4.6 11/16/2014   CL 103 11/16/2014   CREATININE 0.63 11/16/2014   BUN 12 11/16/2014   CO2 29 11/16/2014   TSH 1.60 11/16/2014   HGBA1C 6.6* 11/16/2014   MICROALBUR 0.5 08/13/2014       Assessment & Plan:   Problem List Items Addressed This Visit    Chest pain    Had the episode a few weeks ago of chest pain - woke her from sleep.  She has continued to have intermittent mild chest pain.  Also having the episodes of heart palpitations and light headedness.  EKG revealed SR with no acute ischemic changes.  Given these episodes, I do feel she needs cardiac evaluation.  She has seen Dr Nehemiah Massed previously and request to see him again.  Refer to cardiology for further evaluation.  appt tomorrow.        Hypertension    She describes the episodes as occurring when she has been standing.  After up for a while, will start to feel light headed.  Blood pressure today 116-118/72 standing and 122/78 lying.  Not orthostatic.  She states it has been running low.  Will decrease her losartan to 50mg  q day.  Follow pressures.        Relevant Medications   losartan (COZAAR) tablet   Palpitations - Primary    Symptoms as outlined.  Persistent intermittent palpitations associated with light headedness and a weak feeling.  EKG as outlined.  Not orthostatic on exam.  Will decrease losartan as outlined.  Blood pressure has been running low.  Refer to cardiology for further evaluation.        Stress     Increased stress.  Referred to psychiatry.  Has appt in 12/2014.         Other Visit Diagnoses    Irregular heart beat        Relevant Orders    EKG 12-Lead (Completed)      I spent 25 minutes with the patient and more than 50% of the time was spent in consultation regarding the above.     Einar Pheasant, MD

## 2014-12-23 NOTE — Assessment & Plan Note (Signed)
Increased stress.  Referred to psychiatry.  Has appt in 12/2014.

## 2014-12-23 NOTE — Assessment & Plan Note (Signed)
She describes the episodes as occurring when she has been standing.  After up for a while, will start to feel light headed.  Blood pressure today 116-118/72 standing and 122/78 lying.  Not orthostatic.  She states it has been running low.  Will decrease her losartan to 50mg  q day.  Follow pressures.

## 2014-12-23 NOTE — Assessment & Plan Note (Signed)
Had the episode a few weeks ago of chest pain - woke her from sleep.  She has continued to have intermittent mild chest pain.  Also having the episodes of heart palpitations and light headedness.  EKG revealed SR with no acute ischemic changes.  Given these episodes, I do feel she needs cardiac evaluation.  She has seen Dr Nehemiah Massed previously and request to see him again.  Refer to cardiology for further evaluation.  appt tomorrow.

## 2014-12-23 NOTE — Assessment & Plan Note (Signed)
Symptoms as outlined.  Persistent intermittent palpitations associated with light headedness and a weak feeling.  EKG as outlined.  Not orthostatic on exam.  Will decrease losartan as outlined.  Blood pressure has been running low.  Refer to cardiology for further evaluation.

## 2014-12-28 DIAGNOSIS — R002 Palpitations: Secondary | ICD-10-CM | POA: Diagnosis not present

## 2014-12-29 DIAGNOSIS — M5416 Radiculopathy, lumbar region: Secondary | ICD-10-CM | POA: Diagnosis not present

## 2014-12-29 DIAGNOSIS — M5136 Other intervertebral disc degeneration, lumbar region: Secondary | ICD-10-CM | POA: Diagnosis not present

## 2015-01-03 ENCOUNTER — Other Ambulatory Visit: Payer: Self-pay | Admitting: Internal Medicine

## 2015-01-03 ENCOUNTER — Ambulatory Visit: Payer: Commercial Managed Care - PPO | Admitting: Internal Medicine

## 2015-01-04 DIAGNOSIS — I209 Angina pectoris, unspecified: Secondary | ICD-10-CM | POA: Diagnosis not present

## 2015-01-10 DIAGNOSIS — C50411 Malignant neoplasm of upper-outer quadrant of right female breast: Secondary | ICD-10-CM | POA: Diagnosis not present

## 2015-01-10 DIAGNOSIS — N63 Unspecified lump in breast: Secondary | ICD-10-CM | POA: Diagnosis not present

## 2015-01-11 DIAGNOSIS — I10 Essential (primary) hypertension: Secondary | ICD-10-CM | POA: Diagnosis not present

## 2015-01-11 DIAGNOSIS — R0789 Other chest pain: Secondary | ICD-10-CM | POA: Diagnosis not present

## 2015-01-11 DIAGNOSIS — E782 Mixed hyperlipidemia: Secondary | ICD-10-CM | POA: Diagnosis not present

## 2015-01-11 DIAGNOSIS — R002 Palpitations: Secondary | ICD-10-CM | POA: Diagnosis not present

## 2015-01-19 DIAGNOSIS — F411 Generalized anxiety disorder: Secondary | ICD-10-CM | POA: Diagnosis not present

## 2015-01-19 DIAGNOSIS — F4321 Adjustment disorder with depressed mood: Secondary | ICD-10-CM | POA: Diagnosis not present

## 2015-01-25 DIAGNOSIS — E782 Mixed hyperlipidemia: Secondary | ICD-10-CM | POA: Diagnosis not present

## 2015-01-25 DIAGNOSIS — I311 Chronic constrictive pericarditis: Secondary | ICD-10-CM | POA: Diagnosis not present

## 2015-01-25 DIAGNOSIS — I10 Essential (primary) hypertension: Secondary | ICD-10-CM | POA: Diagnosis not present

## 2015-01-31 DIAGNOSIS — C50911 Malignant neoplasm of unspecified site of right female breast: Secondary | ICD-10-CM | POA: Diagnosis not present

## 2015-02-01 ENCOUNTER — Other Ambulatory Visit: Payer: Self-pay | Admitting: Internal Medicine

## 2015-02-03 DIAGNOSIS — H3322 Serous retinal detachment, left eye: Secondary | ICD-10-CM | POA: Diagnosis not present

## 2015-02-03 DIAGNOSIS — Z961 Presence of intraocular lens: Secondary | ICD-10-CM | POA: Diagnosis not present

## 2015-02-03 DIAGNOSIS — E119 Type 2 diabetes mellitus without complications: Secondary | ICD-10-CM | POA: Diagnosis not present

## 2015-02-03 DIAGNOSIS — H35372 Puckering of macula, left eye: Secondary | ICD-10-CM | POA: Diagnosis not present

## 2015-02-04 DIAGNOSIS — F411 Generalized anxiety disorder: Secondary | ICD-10-CM | POA: Diagnosis not present

## 2015-02-04 DIAGNOSIS — F4321 Adjustment disorder with depressed mood: Secondary | ICD-10-CM | POA: Diagnosis not present

## 2015-02-07 DIAGNOSIS — I311 Chronic constrictive pericarditis: Secondary | ICD-10-CM | POA: Diagnosis not present

## 2015-02-11 ENCOUNTER — Ambulatory Visit
Admission: EM | Admit: 2015-02-11 | Discharge: 2015-02-11 | Disposition: A | Discharge status: Home / Self Care | Payer: Medicare Other | Attending: Family Medicine | Admitting: Family Medicine

## 2015-02-11 DIAGNOSIS — L03012 Cellulitis of left finger: Secondary | ICD-10-CM | POA: Diagnosis not present

## 2015-02-11 DIAGNOSIS — M5416 Radiculopathy, lumbar region: Secondary | ICD-10-CM | POA: Diagnosis not present

## 2015-02-11 DIAGNOSIS — L03011 Cellulitis of right finger: Secondary | ICD-10-CM

## 2015-02-11 DIAGNOSIS — M5136 Other intervertebral disc degeneration, lumbar region: Secondary | ICD-10-CM | POA: Diagnosis not present

## 2015-02-11 DIAGNOSIS — E119 Type 2 diabetes mellitus without complications: Secondary | ICD-10-CM

## 2015-02-11 MED ORDER — MUPIROCIN CALCIUM 2 % EX CREA: 1.0000 "application " | TOPICAL_CREAM | Freq: Two times a day (BID) | CUTANEOUS | Status: DC

## 2015-02-11 MED ORDER — SULFAMETHOXAZOLE-TRIMETHOPRIM 800-160 MG PO TABS: 1.0000 | ORAL_TABLET | Freq: Two times a day (BID) | ORAL | Status: AC

## 2015-02-11 NOTE — Discharge Instructions (Signed)

## 2015-02-11 NOTE — ED Notes (Signed)
Admits to nail biting and states bit nail around left thumb. Last night increased pain and swelling/paranychia.

## 2015-02-11 NOTE — ED Provider Notes (Signed)
CSN: [id number]     Arrival date & time 02/11/15  1040 History   First MD Initiated Contact with Patient 02/11/15 1110     Chief Complaint  Patient presents with  . 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  Has vicodin for chronic back pain took one last night as trouble sleeping helped for a couple hours and when woke up this am took another but it has not helped.  Tried elevating left hand also.  The history is provided by the patient.    Past Medical History  Diagnosis Date  . Hypertension   . Diabetes mellitus   . Hypercholesterolemia   . Endometriosis     requiring hysterectomy  . Nephrolithiasis   . Anxiety and depression   . Pericarditis     recurrent, unkown origin  . Tachycardia   . Depression   . History of chicken pox   . Colon polyps    Past Surgical History  Procedure Laterality Date  . Oophorectomy  1982  . Cholecystectomy      open  . Tonsillectomy    . Appendectomy  1055  . Back surgery  1005-2010    laminectomy  . Abdominal hysterectomy  1980   Family History  Problem Relation Age of Onset  . Heart disease Father     myocardial infarction - died 19  . Thyroid disease Mother   . Transient ischemic attack Mother     multiple  . Breast cancer Mother   . Hyperlipidemia Mother   . Kidney disease Mother   . Diabetes Mother   . Rheumatic fever Sister     mitral valve problems  . Breast cancer Paternal Aunt   . Colon cancer Neg Hx   . Other Sister     Small vessel disease   History  Substance Use Topics  . Smoking status: Never Smoker   . Smokeless tobacco: Never Used  . Alcohol Use: No   OB History    No data available     Review of Systems  Constitutional: Negative for fever, chills, diaphoresis,  activity change, appetite change and fatigue.  HENT: Negative for ear discharge and ear pain.   Eyes: Negative for pain, discharge and itching.  Cardiovascular: Negative for chest pain, palpitations and leg swelling.  Gastrointestinal: Negative for nausea, vomiting, diarrhea and constipation.  Genitourinary: Negative for dysuria and difficulty urinating.  Musculoskeletal: Positive for myalgias. Negative for back pain, joint swelling, arthralgias, gait problem, neck pain and neck stiffness.  Skin: Positive for color change and rash. Negative for pallor and wound.  Allergic/Immunologic: Negative for environmental allergies and food allergies.  Neurological: Negative for dizziness, weakness, light-headedness and headaches.  Hematological: Negative for adenopathy. Does not bruise/bleed easily.  Psychiatric/Behavioral: Positive for sleep disturbance. Negative for confusion and agitation.    Allergies  Ivp dye  Home Medications   Prior to Admission medications   Medication Sig Start Date End Date Taking? Authorizing Provider  aspirin 81 MG tablet Take 81 mg by mouth daily.   Yes Historical Provider, MD  atorvastatin (LIPITOR) 40 MG tablet TAKE 1 TABLET BY MOUTH EVERY DAY 02/01/15  Yes Einar Pheasant, MD  cetirizine (ZYRTEC) 10 MG tablet Take 10 mg by mouth daily.   Yes Historical Provider, MD  DULoxetine (CYMBALTA) 60 MG capsule Take 60 mg by  mouth daily.   Yes Historical Provider, MD  gabapentin (NEURONTIN) 300 MG capsule 300 mg. Take 3 capsules q hs   Yes Historical Provider, MD  latanoprost (XALATAN) 0.005 % ophthalmic solution Place 1 drop into the left eye at bedtime.   Yes Historical Provider, MD  losartan (COZAAR) 100 MG tablet TAKE 1 TABLET (100 MG TOTAL) BY MOUTH DAILY. 01/03/15  Yes Einar Pheasant, MD  metFORMIN (GLUCOPHAGE) 500 MG tablet TAKE 2 TABLETS BY MOUTH EVERY MORNING AND TAKE 1 TABLET AT SUPPER 09/20/14  Yes Einar Pheasant, MD  ONE TOUCH ULTRA TEST test strip TEST BLOOD SUGAR  TWICE A DAY   Yes Einar Pheasant, MD  oxyCODONE (OXY IR/ROXICODONE) 5 MG immediate release tablet May take up 3 tablets at a time every 6 hours 04/30/14  Yes Historical Provider, MD  pantoprazole (PROTONIX) 40 MG tablet Take 1 tablet (40 mg total) by mouth 2 (two) times daily. 11/04/14  Yes Einar Pheasant, MD  acyclovir cream (ZOVIRAX) 5 % Use as directed 11/04/14   Einar Pheasant, MD  anastrozole (ARIMIDEX) 1 MG tablet Take 1 mg by mouth daily.    Historical Provider, MD  losartan (COZAAR) 50 MG tablet Take 1 tablet (50 mg total) by mouth daily. 12/23/14   Einar Pheasant, MD  mupirocin cream (BACTROBAN) 2 % Apply 1 application topically 2 (two) times daily. 02/11/15   Olen Cordial, NP  sulfamethoxazole-trimethoprim (BACTRIM DS,SEPTRA DS) 800-160 MG per tablet Take 1 tablet by mouth 2 (two) times daily. 02/11/15 02/18/15  Olen Cordial, NP  venlafaxine XR (EFFEXOR XR) 150 MG 24 hr capsule Take 1 capsule (150 mg total) by mouth daily with breakfast. 11/04/14   Einar Pheasant, MD   BP 148/92 mmHg  Pulse 98  Temp(Src) 97.5 F (36.4 C) (Tympanic)  Resp 20  Ht 5\' 4"  (1.626 m)  Wt 150 lb (68.04 kg)  BMI 25.73 kg/m2  SpO2 99% Physical Exam  Constitutional: She is oriented to person, place, and time. Vital signs are normal. She appears well-developed and well-nourished. She appears distressed.  HENT:  Head: Normocephalic and atraumatic.  Right Ear: External ear normal.  Left Ear: External ear normal.  Nose: Nose normal.  Mouth/Throat: Oropharynx is clear and moist.  Eyes: Conjunctivae, EOM and lids are normal. Pupils are equal, round, and reactive to light. Right eye exhibits no discharge. Left eye exhibits no discharge. No scleral icterus.  Neck: Trachea normal and normal range of motion. Neck supple. No tracheal deviation present.  Cardiovascular: Normal rate, regular rhythm and intact distal pulses.   Pulmonary/Chest: Effort normal and breath sounds normal. No respiratory distress. She has  no wheezes. She has no rales. She exhibits no tenderness.  Musculoskeletal: Normal range of motion. She exhibits edema and tenderness.  Lymphadenopathy:    She has no cervical adenopathy.  Neurological: She is alert and oriented to person, place, and time.  Skin: Skin is warm, dry and intact. Bruising and rash noted. No abrasion, no burn, no ecchymosis, no laceration, no lesion, no petechiae and no purpura noted. Rash is macular and pustular. Rash is not papular, not maculopapular, not nodular, not vesicular and not urticarial. She is not diaphoretic. There is erythema. No cyanosis. No pallor. Nails show no clubbing.     Psychiatric: She has a normal mood and affect. Her speech is normal and behavior is normal. Judgment and thought content normal. Cognition and memory are normal.  Nursing note and vitals reviewed.   ED Course  INCISION AND DRAINAGE  Date/Time: 02/11/2015 12:15 PM Performed by: Olen Cordial Authorized by: Norval Gable Consent: Verbal consent obtained. Written consent not obtained. Risks and benefits: risks, benefits and alternatives were discussed Consent given by: patient Patient understanding: patient states understanding of the procedure being performed Patient consent: the patient's understanding of the procedure matches consent given Procedure consent: procedure consent matches procedure scheduled Relevant documents: relevant documents present and verified Site marked: the operative site was marked Patient identity confirmed: verbally with patient and arm band Time out: Immediately prior to procedure a "time out" was called to verify the correct patient, procedure, equipment, support staff and site/side marked as required. Type: abscess Body area: upper extremity Location details: left thumb Patient sedated: no Incision type: single straight Complexity: simple Drainage: purulent and  serosanguinous Drainage amount: moderate Wound treatment: wound left  open Packing material: none Patient tolerance: Patient tolerated the procedure well with no immediate complications Comments: Time out performed, correct patient (patient identifiers: name and date of birth), correct procedure, correct body part, correct laterality. Pt was prepped with hibiclens and sterile normal saline and draped in a sterile fashion.  Patient did not want digital or local block performed.  3 puncture with sterile 18 gauge needle and compressed with sterile gauze approximately 68ml pustular material obtained then serousanguinous drainage.  Patient reported immediate relief of pressure.  Soft tissue slightly TTP base thumbnail left with echymosis.  Simple dressing applied.  Homeostasis was obtained with direct pressure.  The patient tolerated the procedure well, no apparent complications.  No specimen sent to pathology.   (including critical care time) Labs Review Labs Reviewed - No data to display  Imaging Review No results found.   MDM   1. Cellulitis of finger of right hand   2. Paronychia of thumb, left    Plan: 1. Test/x-ray results and diagnosis reviewed with patient 2. rx as per orders; risks, benefits, potential side effects reviewed with patient 3. Recommend supportive treatment with  4. F/u prn if symptoms worsen or don't improve Diabetic with new open wound will treat with po and topical.  Continue Epsom salt warm/hot soaks 2-3 times per day for 20 minutes.  Take oral antibiotic; apply bactroban twice a day and as needed with dressing changes.  Red streak should decrease over next 24 -48 hours.  Follow up for wound re-evaluation Sunday 13 Feb 2015 sooner if worsening symptoms.  Monitor for red streaks, fever, worsening pain in affected extremity.  Purulent discharge may develop in the next 24 hours but should decrease and resolve with oral antibiotics.  Follow up with PCM if worsening pain, extending red streaks, worsening pain, and worsening purulent discharge at  ED tonight/tomorrow.  Patient given [employer] handout on paronychia and cellulitis  Patient verbalized understanding of instructions, agreed with plan of care and had no further questions at this time.      Olen Cordial, NP 02/11/15 1427

## 2015-02-14 ENCOUNTER — Telehealth: Payer: Self-pay | Admitting: *Deleted

## 2015-02-14 NOTE — Telephone Encounter (Signed)
Pt states she is doing well, is improving. Taking abx as directed. States it was drained and symptoms have improved. Advised to call back with persistent or worsening symptoms,  verbalized understanding

## 2015-02-14 NOTE — Telephone Encounter (Signed)
Left message for pt to return my call. Dr. Nicki Reaper wanted pt called to follow up on recent ED visit, see if she is doing ok.

## 2015-02-18 DIAGNOSIS — M65331 Trigger finger, right middle finger: Secondary | ICD-10-CM | POA: Diagnosis not present

## 2015-02-18 DIAGNOSIS — M65322 Trigger finger, left index finger: Secondary | ICD-10-CM | POA: Diagnosis not present

## 2015-02-18 DIAGNOSIS — M65321 Trigger finger, right index finger: Secondary | ICD-10-CM | POA: Diagnosis not present

## 2015-02-18 DIAGNOSIS — M65332 Trigger finger, left middle finger: Secondary | ICD-10-CM | POA: Diagnosis not present

## 2015-02-24 DIAGNOSIS — I311 Chronic constrictive pericarditis: Secondary | ICD-10-CM | POA: Diagnosis not present

## 2015-02-25 DIAGNOSIS — M5416 Radiculopathy, lumbar region: Secondary | ICD-10-CM | POA: Diagnosis not present

## 2015-02-25 DIAGNOSIS — M5136 Other intervertebral disc degeneration, lumbar region: Secondary | ICD-10-CM | POA: Diagnosis not present

## 2015-03-02 DIAGNOSIS — M65321 Trigger finger, right index finger: Secondary | ICD-10-CM | POA: Diagnosis not present

## 2015-03-02 DIAGNOSIS — Z01818 Encounter for other preprocedural examination: Secondary | ICD-10-CM | POA: Diagnosis not present

## 2015-03-02 DIAGNOSIS — M65332 Trigger finger, left middle finger: Secondary | ICD-10-CM | POA: Diagnosis not present

## 2015-03-02 DIAGNOSIS — M65322 Trigger finger, left index finger: Secondary | ICD-10-CM | POA: Diagnosis not present

## 2015-03-02 DIAGNOSIS — M65331 Trigger finger, right middle finger: Secondary | ICD-10-CM | POA: Diagnosis not present

## 2015-03-03 ENCOUNTER — Ambulatory Visit: Payer: Commercial Managed Care - PPO | Admitting: Internal Medicine

## 2015-03-03 DIAGNOSIS — M810 Age-related osteoporosis without current pathological fracture: Secondary | ICD-10-CM | POA: Diagnosis not present

## 2015-03-03 DIAGNOSIS — C50911 Malignant neoplasm of unspecified site of right female breast: Secondary | ICD-10-CM | POA: Diagnosis not present

## 2015-03-08 DIAGNOSIS — R0602 Shortness of breath: Secondary | ICD-10-CM | POA: Diagnosis not present

## 2015-03-08 DIAGNOSIS — M65321 Trigger finger, right index finger: Secondary | ICD-10-CM | POA: Diagnosis not present

## 2015-03-08 DIAGNOSIS — Z961 Presence of intraocular lens: Secondary | ICD-10-CM | POA: Diagnosis not present

## 2015-03-08 DIAGNOSIS — E119 Type 2 diabetes mellitus without complications: Secondary | ICD-10-CM | POA: Diagnosis not present

## 2015-03-08 DIAGNOSIS — I129 Hypertensive chronic kidney disease with stage 1 through stage 4 chronic kidney disease, or unspecified chronic kidney disease: Secondary | ICD-10-CM | POA: Diagnosis not present

## 2015-03-08 DIAGNOSIS — M65322 Trigger finger, left index finger: Secondary | ICD-10-CM | POA: Diagnosis not present

## 2015-03-08 DIAGNOSIS — N189 Chronic kidney disease, unspecified: Secondary | ICD-10-CM | POA: Diagnosis not present

## 2015-03-08 DIAGNOSIS — K219 Gastro-esophageal reflux disease without esophagitis: Secondary | ICD-10-CM | POA: Diagnosis not present

## 2015-03-08 DIAGNOSIS — I119 Hypertensive heart disease without heart failure: Secondary | ICD-10-CM | POA: Diagnosis not present

## 2015-03-08 DIAGNOSIS — M65331 Trigger finger, right middle finger: Secondary | ICD-10-CM | POA: Diagnosis not present

## 2015-03-08 DIAGNOSIS — M65332 Trigger finger, left middle finger: Secondary | ICD-10-CM | POA: Diagnosis not present

## 2015-03-14 DIAGNOSIS — M25641 Stiffness of right hand, not elsewhere classified: Secondary | ICD-10-CM | POA: Diagnosis not present

## 2015-03-14 DIAGNOSIS — M79641 Pain in right hand: Secondary | ICD-10-CM | POA: Diagnosis not present

## 2015-03-18 DIAGNOSIS — M79641 Pain in right hand: Secondary | ICD-10-CM | POA: Diagnosis not present

## 2015-03-18 DIAGNOSIS — M25641 Stiffness of right hand, not elsewhere classified: Secondary | ICD-10-CM | POA: Diagnosis not present

## 2015-03-22 DIAGNOSIS — F4321 Adjustment disorder with depressed mood: Secondary | ICD-10-CM | POA: Diagnosis not present

## 2015-03-22 DIAGNOSIS — M79641 Pain in right hand: Secondary | ICD-10-CM | POA: Diagnosis not present

## 2015-03-22 DIAGNOSIS — F411 Generalized anxiety disorder: Secondary | ICD-10-CM | POA: Diagnosis not present

## 2015-03-22 DIAGNOSIS — M25641 Stiffness of right hand, not elsewhere classified: Secondary | ICD-10-CM | POA: Diagnosis not present

## 2015-03-25 DIAGNOSIS — M25641 Stiffness of right hand, not elsewhere classified: Secondary | ICD-10-CM | POA: Diagnosis not present

## 2015-03-25 DIAGNOSIS — M79641 Pain in right hand: Secondary | ICD-10-CM | POA: Diagnosis not present

## 2015-03-29 DIAGNOSIS — M79641 Pain in right hand: Secondary | ICD-10-CM | POA: Diagnosis not present

## 2015-03-29 DIAGNOSIS — M25641 Stiffness of right hand, not elsewhere classified: Secondary | ICD-10-CM | POA: Diagnosis not present

## 2015-04-05 DIAGNOSIS — E782 Mixed hyperlipidemia: Secondary | ICD-10-CM | POA: Diagnosis not present

## 2015-04-05 DIAGNOSIS — R0789 Other chest pain: Secondary | ICD-10-CM | POA: Diagnosis not present

## 2015-04-05 DIAGNOSIS — I10 Essential (primary) hypertension: Secondary | ICD-10-CM | POA: Diagnosis not present

## 2015-04-05 DIAGNOSIS — R0602 Shortness of breath: Secondary | ICD-10-CM | POA: Diagnosis not present

## 2015-04-08 DIAGNOSIS — M5136 Other intervertebral disc degeneration, lumbar region: Secondary | ICD-10-CM | POA: Diagnosis not present

## 2015-04-08 DIAGNOSIS — M5416 Radiculopathy, lumbar region: Secondary | ICD-10-CM | POA: Diagnosis not present

## 2015-05-13 DIAGNOSIS — M5136 Other intervertebral disc degeneration, lumbar region: Secondary | ICD-10-CM | POA: Diagnosis not present

## 2015-05-13 DIAGNOSIS — M5416 Radiculopathy, lumbar region: Secondary | ICD-10-CM | POA: Diagnosis not present

## 2015-05-17 ENCOUNTER — Other Ambulatory Visit: Payer: Self-pay | Admitting: Internal Medicine

## 2015-05-23 ENCOUNTER — Encounter: Payer: Self-pay | Admitting: Internal Medicine

## 2015-05-23 ENCOUNTER — Ambulatory Visit (INDEPENDENT_AMBULATORY_CARE_PROVIDER_SITE_OTHER): Payer: Medicare Other | Admitting: Internal Medicine

## 2015-05-23 VITALS — BP 100/70 | HR 81 | Temp 98.2°F | Ht 64.0 in | Wt 157.5 lb

## 2015-05-23 DIAGNOSIS — M549 Dorsalgia, unspecified: Secondary | ICD-10-CM

## 2015-05-23 DIAGNOSIS — E041 Nontoxic single thyroid nodule: Secondary | ICD-10-CM | POA: Diagnosis not present

## 2015-05-23 DIAGNOSIS — K219 Gastro-esophageal reflux disease without esophagitis: Secondary | ICD-10-CM

## 2015-05-23 DIAGNOSIS — Z658 Other specified problems related to psychosocial circumstances: Secondary | ICD-10-CM

## 2015-05-23 DIAGNOSIS — E119 Type 2 diabetes mellitus without complications: Secondary | ICD-10-CM

## 2015-05-23 DIAGNOSIS — Z23 Encounter for immunization: Secondary | ICD-10-CM

## 2015-05-23 DIAGNOSIS — C50919 Malignant neoplasm of unspecified site of unspecified female breast: Secondary | ICD-10-CM

## 2015-05-23 DIAGNOSIS — E7800 Pure hypercholesterolemia, unspecified: Secondary | ICD-10-CM

## 2015-05-23 DIAGNOSIS — Z86010 Personal history of colonic polyps: Secondary | ICD-10-CM

## 2015-05-23 DIAGNOSIS — F439 Reaction to severe stress, unspecified: Secondary | ICD-10-CM

## 2015-05-23 DIAGNOSIS — G8929 Other chronic pain: Secondary | ICD-10-CM

## 2015-05-23 DIAGNOSIS — I10 Essential (primary) hypertension: Secondary | ICD-10-CM | POA: Diagnosis not present

## 2015-05-23 LAB — CBC WITH DIFFERENTIAL/PLATELET
BASOS ABS: 0 10*3/uL (ref 0.0–0.1)
BASOS PCT: 0.2 % (ref 0.0–3.0)
EOS ABS: 0.1 10*3/uL (ref 0.0–0.7)
Eosinophils Relative: 1.3 % (ref 0.0–5.0)
HCT: 36.8 % (ref 36.0–46.0)
Hemoglobin: 12.3 g/dL (ref 12.0–15.0)
LYMPHS ABS: 2.1 10*3/uL (ref 0.7–4.0)
Lymphocytes Relative: 46.7 % — ABNORMAL HIGH (ref 12.0–46.0)
MCHC: 33.3 g/dL (ref 30.0–36.0)
MCV: 89.9 fl (ref 78.0–100.0)
MONO ABS: 0.4 10*3/uL (ref 0.1–1.0)
Monocytes Relative: 8.1 % (ref 3.0–12.0)
NEUTROS ABS: 2 10*3/uL (ref 1.4–7.7)
NEUTROS PCT: 43.7 % (ref 43.0–77.0)
PLATELETS: 352 10*3/uL (ref 150.0–400.0)
RBC: 4.09 Mil/uL (ref 3.87–5.11)
RDW: 13.4 % (ref 11.5–15.5)
WBC: 4.6 10*3/uL (ref 4.0–10.5)

## 2015-05-23 LAB — BASIC METABOLIC PANEL
BUN: 10 mg/dL (ref 6–23)
CO2: 28 mEq/L (ref 19–32)
CREATININE: 0.57 mg/dL (ref 0.40–1.20)
Calcium: 9.6 mg/dL (ref 8.4–10.5)
Chloride: 102 mEq/L (ref 96–112)
GFR: 111.71 mL/min (ref >60.00)
GLUCOSE: 99 mg/dL (ref 70–99)
POTASSIUM: 4.6 meq/L (ref 3.5–5.1)
Sodium: 139 mEq/L (ref 135–145)

## 2015-05-23 LAB — HEPATIC FUNCTION PANEL
ALK PHOS: 85 U/L (ref 39–117)
ALT: 13 U/L (ref 0–35)
AST: 13 U/L (ref 0–37)
Albumin: 4.4 g/dL (ref 3.5–5.2)
BILIRUBIN DIRECT: 0.3 mg/dL (ref 0.0–0.3)
BILIRUBIN TOTAL: 1.6 mg/dL — AB (ref 0.2–1.2)
Total Protein: 6.8 g/dL (ref 6.0–8.3)

## 2015-05-23 LAB — LIPID PANEL
CHOLESTEROL: 140 mg/dL (ref 0–200)
HDL: 47.5 mg/dL (ref >39.00)
LDL CALC: 75 mg/dL (ref 0–99)
NonHDL: 92.69
TRIGLYCERIDES: 90 mg/dL (ref 0.0–149.0)
Total CHOL/HDL Ratio: 3
VLDL: 18 mg/dL (ref 0.0–40.0)

## 2015-05-23 LAB — HEMOGLOBIN A1C: Hgb A1c MFr Bld: 6.5 % (ref 4.6–6.5)

## 2015-05-23 LAB — TSH: TSH: 1.07 u[IU]/mL (ref 0.35–4.50)

## 2015-05-23 NOTE — Progress Notes (Signed)
Pre-visit discussion using our clinic review tool. No additional management support is needed unless otherwise documented below in the visit note.  

## 2015-05-23 NOTE — Progress Notes (Signed)
Patient ID: Sherry Simon, female   DOB: Oct 12, 1945, 69 y.o.   MRN: [id number]   Subjective:    Patient ID: Sherry Simon, female    DOB: 1945/09/27, 69 y.o.   MRN: [id number]  HPI  Patient here for a scheduled follow up.  She is seeing Dr Nicolasa Ducking.  On cymbalta.  Doing better.  Husband is doing better.  Saw Dr Nehemiah Massed.  Cardiac w/up negative.  She is sleeping better.  Taking four gabapentin before bed.  Sees oncology.  Stable.  No cardiac symptoms with increased activity or exertion.  No sob.  Eating and drinking well.  Bowels stable.     Past Medical History  Diagnosis Date  . Hypertension   . Diabetes mellitus   . Hypercholesterolemia   . Endometriosis     requiring hysterectomy  . Nephrolithiasis   . Anxiety and depression   . Pericarditis     recurrent, unkown origin  . Tachycardia   . Depression   . History of chicken pox   . Colon polyps    Past Surgical History  Procedure Laterality Date  . Oophorectomy  1982  . Cholecystectomy      open  . Tonsillectomy    . Appendectomy  1055  . Back surgery  1005-2010    laminectomy  . Abdominal hysterectomy  1980   Family History  Problem Relation Age of Onset  . Heart disease Father     myocardial infarction - died 71  . Thyroid disease Mother   . Transient ischemic attack Mother     multiple  . Breast cancer Mother   . Hyperlipidemia Mother   . Kidney disease Mother   . Diabetes Mother   . Rheumatic fever Sister     mitral valve problems  . Breast cancer Paternal Aunt   . Colon cancer Neg Hx   . Other Sister     Small vessel disease   Social History   Social History  . Marital Status: Married    Spouse Name: N/A  . Number of Children: 3  . Years of Education: N/A   Social History Main Topics  . Smoking status: Never Smoker   . Smokeless tobacco: Never Used  . Alcohol Use: No  . Drug Use: No  . Sexual Activity: Not Asked   Other Topics Concern  . None   Social History Narrative     Outpatient Encounter Prescriptions as of 05/23/2015  Medication Sig  . acyclovir cream (ZOVIRAX) 5 % Use as directed  . aspirin 81 MG tablet Take 81 mg by mouth daily.  Marland Kitchen atorvastatin (LIPITOR) 40 MG tablet TAKE 1 TABLET BY MOUTH EVERY DAY  . DULoxetine (CYMBALTA) 60 MG capsule Take 60 mg by mouth daily.  Marland Kitchen gabapentin (NEURONTIN) 300 MG capsule 300 mg. Take 3 capsules q hs  . latanoprost (XALATAN) 0.005 % ophthalmic solution Place 1 drop into the left eye at bedtime.  Marland Kitchen losartan (COZAAR) 100 MG tablet TAKE 1 TABLET (100 MG TOTAL) BY MOUTH DAILY.  . metFORMIN (GLUCOPHAGE) 500 MG tablet TAKE 2 TABLETS BY MOUTH EVERY MORNING AND TAKE 1 TABLET AT SUPPER  . mupirocin cream (BACTROBAN) 2 % Apply 1 application topically 2 (two) times daily.  . ONE TOUCH ULTRA TEST test strip TEST BLOOD SUGAR TWICE A DAY  . oxyCODONE (OXY IR/ROXICODONE) 5 MG immediate release tablet May take up 3 tablets at a time every 6 hours  . pantoprazole (PROTONIX) 40 MG tablet TAKE 1 TABLET (40  MG TOTAL) BY MOUTH 2 (TWO) TIMES DAILY.  . [DISCONTINUED] anastrozole (ARIMIDEX) 1 MG tablet Take 1 mg by mouth daily.  . [DISCONTINUED] cetirizine (ZYRTEC) 10 MG tablet Take 10 mg by mouth daily.  . [DISCONTINUED] losartan (COZAAR) 50 MG tablet Take 1 tablet (50 mg total) by mouth daily.  . [DISCONTINUED] venlafaxine XR (EFFEXOR XR) 150 MG 24 hr capsule Take 1 capsule (150 mg total) by mouth daily with breakfast.   No facility-administered encounter medications on file as of 05/23/2015.    Review of Systems  Constitutional: Negative for appetite change and unexpected weight change.  HENT: Negative for congestion and sinus pressure.   Eyes: Negative for discharge and visual disturbance.  Respiratory: Negative for cough, chest tightness and shortness of breath.   Cardiovascular: Negative for chest pain, palpitations and leg swelling.  Gastrointestinal: Negative for nausea, vomiting, abdominal pain and diarrhea.  Genitourinary:  Negative for dysuria and difficulty urinating.  Musculoskeletal: Positive for back pain (chronic.  persistent.  ).  Skin: Negative for color change and rash.  Neurological: Negative for dizziness, light-headedness and headaches.  Psychiatric/Behavioral: Negative for dysphoric mood and agitation.       Sleeping better.         Objective:     Blood pressure rechecked by me:  120/72  Physical Exam  Constitutional: She appears well-developed and well-nourished. No distress.  HENT:  Nose: Nose normal.  Mouth/Throat: Oropharynx is clear and moist.  Eyes: Conjunctivae are normal. Right eye exhibits no discharge. Left eye exhibits no discharge.  Neck: Neck supple. No thyromegaly present.  Cardiovascular: Normal rate and regular rhythm.   Pulmonary/Chest: Breath sounds normal. No respiratory distress. She has no wheezes.  Abdominal: Soft. Bowel sounds are normal. There is no tenderness.  Musculoskeletal: She exhibits no edema or tenderness.  Lymphadenopathy:    She has no cervical adenopathy.  Skin: No rash noted. No erythema.  Psychiatric: She has a normal mood and affect. Her behavior is normal.    BP 100/70 mmHg  Pulse 81  Temp(Src) 98.2 F (36.8 C) (Oral)  Ht '5\' 4"'  (1.626 m)  Wt 157 lb 8 oz (71.442 kg)  BMI 27.02 kg/m2  SpO2 97% Wt Readings from Last 3 Encounters:  05/23/15 157 lb 8 oz (71.442 kg)  02/11/15 150 lb (68.04 kg)  12/22/14 154 lb (69.854 kg)     Lab Results  Component Value Date   WBC 4.6 05/23/2015   HGB 12.3 05/23/2015   HCT 36.8 05/23/2015   PLT 352.0 05/23/2015   GLUCOSE 99 05/23/2015   CHOL 140 05/23/2015   TRIG 90.0 05/23/2015   HDL 47.50 05/23/2015   LDLCALC 75 05/23/2015   ALT 13 05/23/2015   AST 13 05/23/2015   NA 139 05/23/2015   K 4.6 05/23/2015   CL 102 05/23/2015   CREATININE 0.57 05/23/2015   BUN 10 05/23/2015   CO2 28 05/23/2015   TSH 1.07 05/23/2015   HGBA1C 6.5 05/23/2015   MICROALBUR 0.5 08/13/2014       Assessment & Plan:    Problem List Items Addressed This Visit    Breast cancer    Followed by oncology.  Mammogram 01/10/15 - Birads II.        Chronic back pain    S/p back surgery.  Still with pain.  Takes pain medication.  Seeing Dr Sharlet Salina.        Diabetes mellitus    Low carb diet and exercise.  Follow met b and a1c.  Relevant Orders   Hemoglobin A1c (Completed)   GERD (gastroesophageal reflux disease)    Symptoms controlled.  On protonix.        History of colonic polyps    07/24/10 - 45m polyp in the ascending colon and diverticulosis.  Bowels stable.        Hypercholesterolemia    On lipitor.  Low cholesterol diet and exercise.  Follow lipid panel and liver function tests.        Relevant Orders   Lipid panel (Completed)   Hepatic function panel (Completed)   Hypertension - Primary    Blood pressure under good control.  Continue same medication regimen.  Follow pressures.  Follow metabolic panel.        Relevant Orders   CBC with Differential/Platelet (Completed)   Basic metabolic panel (Completed)   Stress    Followed by psychiatry.  On cymbalta.  Doing better.        Thyroid nodule    S/p biopsy.  Benign.  Follow thyroid function.        Relevant Orders   TSH (Completed)       SEinar Pheasant MD

## 2015-05-24 ENCOUNTER — Other Ambulatory Visit: Payer: Self-pay | Admitting: Internal Medicine

## 2015-05-24 ENCOUNTER — Encounter: Payer: Self-pay | Admitting: *Deleted

## 2015-05-24 NOTE — Progress Notes (Signed)
Order placed for f/u liver panel.  

## 2015-05-29 ENCOUNTER — Encounter: Payer: Self-pay | Admitting: Internal Medicine

## 2015-05-29 NOTE — Assessment & Plan Note (Signed)
S/p back surgery.  Still with pain.  Takes pain medication.  Seeing Dr Sharlet Salina.

## 2015-05-29 NOTE — Assessment & Plan Note (Signed)
On lipitor.  Low cholesterol diet and exercise.  Follow lipid panel and liver function tests.   

## 2015-05-29 NOTE — Assessment & Plan Note (Signed)
Followed by oncology.  Mammogram 01/10/15 - Birads II.

## 2015-05-29 NOTE — Assessment & Plan Note (Signed)
Blood pressure under good control.  Continue same medication regimen.  Follow pressures.  Follow metabolic panel.   

## 2015-05-29 NOTE — Assessment & Plan Note (Signed)
Low carb diet and exercise.  Follow met b and a1c.   

## 2015-05-29 NOTE — Assessment & Plan Note (Signed)
Followed by psychiatry.  On cymbalta.  Doing better.

## 2015-05-29 NOTE — Assessment & Plan Note (Signed)
07/24/10 - 24mm polyp in the ascending colon and diverticulosis.  Bowels stable.

## 2015-05-29 NOTE — Assessment & Plan Note (Signed)
Symptoms controlled.  On protonix.   

## 2015-05-29 NOTE — Assessment & Plan Note (Signed)
S/p biopsy.  Benign.  Follow thyroid function.   

## 2015-06-01 DIAGNOSIS — Z87898 Personal history of other specified conditions: Secondary | ICD-10-CM | POA: Diagnosis not present

## 2015-06-01 DIAGNOSIS — E782 Mixed hyperlipidemia: Secondary | ICD-10-CM | POA: Diagnosis not present

## 2015-06-01 DIAGNOSIS — I10 Essential (primary) hypertension: Secondary | ICD-10-CM | POA: Diagnosis not present

## 2015-06-15 ENCOUNTER — Encounter: Payer: Self-pay | Admitting: Internal Medicine

## 2015-06-15 ENCOUNTER — Other Ambulatory Visit (INDEPENDENT_AMBULATORY_CARE_PROVIDER_SITE_OTHER): Payer: Medicare Other

## 2015-06-15 LAB — HEPATIC FUNCTION PANEL
ALT: 13 U/L (ref 0–35)
AST: 12 U/L (ref 0–37)
Albumin: 4.4 g/dL (ref 3.5–5.2)
Alkaline Phosphatase: 82 U/L (ref 39–117)
BILIRUBIN DIRECT: 0.3 mg/dL (ref 0.0–0.3)
BILIRUBIN TOTAL: 1.5 mg/dL — AB (ref 0.2–1.2)
TOTAL PROTEIN: 6.8 g/dL (ref 6.0–8.3)

## 2015-06-17 NOTE — Telephone Encounter (Signed)
Unread mychart message mailed to patient 

## 2015-06-23 ENCOUNTER — Other Ambulatory Visit: Payer: Self-pay | Admitting: Internal Medicine

## 2015-06-23 DIAGNOSIS — H35372 Puckering of macula, left eye: Secondary | ICD-10-CM | POA: Diagnosis not present

## 2015-06-23 DIAGNOSIS — H4011X1 Primary open-angle glaucoma, mild stage: Secondary | ICD-10-CM | POA: Diagnosis not present

## 2015-06-23 DIAGNOSIS — Z961 Presence of intraocular lens: Secondary | ICD-10-CM | POA: Diagnosis not present

## 2015-06-24 DIAGNOSIS — C50911 Malignant neoplasm of unspecified site of right female breast: Secondary | ICD-10-CM | POA: Diagnosis not present

## 2015-06-24 DIAGNOSIS — M5136 Other intervertebral disc degeneration, lumbar region: Secondary | ICD-10-CM | POA: Diagnosis not present

## 2015-06-24 DIAGNOSIS — M5416 Radiculopathy, lumbar region: Secondary | ICD-10-CM | POA: Diagnosis not present

## 2015-07-21 DIAGNOSIS — M5416 Radiculopathy, lumbar region: Secondary | ICD-10-CM | POA: Diagnosis not present

## 2015-07-21 DIAGNOSIS — M5136 Other intervertebral disc degeneration, lumbar region: Secondary | ICD-10-CM | POA: Diagnosis not present

## 2015-07-26 ENCOUNTER — Encounter: Payer: Self-pay | Admitting: Internal Medicine

## 2015-07-27 MED ORDER — DULOXETINE HCL 30 MG PO CPEP: 30.0000 mg | ORAL_CAPSULE | Freq: Every day | ORAL | Status: DC

## 2015-07-27 MED ORDER — CITALOPRAM HYDROBROMIDE 20 MG PO TABS: 20.0000 mg | ORAL_TABLET | Freq: Every day | ORAL | Status: DC

## 2015-07-27 NOTE — Telephone Encounter (Signed)
Spoke to Dr Nicolasa Ducking.  Pt was instructed to take decrease cymbalta to 30mg  q day for five days and start citalopram 20mg  q day.  After five days, she will stop cymbalta and continue citalopram.  rx's sent in.  Call if problems.

## 2015-08-17 ENCOUNTER — Ambulatory Visit
Admission: EM | Admit: 2015-08-17 | Discharge: 2015-08-17 | Discharge status: Absent without leave | Payer: Medicare Other | Attending: Family Medicine | Admitting: Family Medicine

## 2015-08-24 ENCOUNTER — Encounter: Payer: Commercial Managed Care - PPO | Admitting: Internal Medicine

## 2015-08-25 ENCOUNTER — Encounter: Payer: Self-pay | Admitting: Internal Medicine

## 2015-08-29 ENCOUNTER — Telehealth: Payer: Self-pay | Admitting: *Deleted

## 2015-08-29 NOTE — Telephone Encounter (Signed)
Gregary Signs from Dr. Harden Mo, has requested progress notes for patient,in reference to medication change.Patient will be coming off cymbalta, and using another medication.

## 2015-08-29 NOTE — Telephone Encounter (Signed)
Left patient a message & requested a call back or a response to the mychart message sent on 12/2. However, it looks like the medication was sent in on 08/26/15 for #30 with one refill.

## 2015-08-29 NOTE — Telephone Encounter (Signed)
I have left a message with patient to return my call & sent her a mychart message

## 2015-08-29 NOTE — Telephone Encounter (Signed)
See previous my chart messages and unread my chart message from today.  I had previously told her how to taper the cymbalta.  This was in a message and not in a progress note.  Need to know if she is off cymbalta before increasing citalopram more.

## 2015-08-29 NOTE — Telephone Encounter (Signed)
Please advise 

## 2015-08-31 ENCOUNTER — Encounter: Payer: Self-pay | Admitting: Internal Medicine

## 2015-08-31 ENCOUNTER — Ambulatory Visit (INDEPENDENT_AMBULATORY_CARE_PROVIDER_SITE_OTHER): Payer: Medicare Other | Admitting: Internal Medicine

## 2015-08-31 VITALS — BP 98/60 | HR 76 | Temp 98.3°F | Resp 18 | Ht 64.5 in | Wt 160.0 lb

## 2015-08-31 DIAGNOSIS — I10 Essential (primary) hypertension: Secondary | ICD-10-CM | POA: Diagnosis not present

## 2015-08-31 DIAGNOSIS — E119 Type 2 diabetes mellitus without complications: Secondary | ICD-10-CM

## 2015-08-31 DIAGNOSIS — N951 Menopausal and female climacteric states: Secondary | ICD-10-CM

## 2015-08-31 DIAGNOSIS — E7800 Pure hypercholesterolemia, unspecified: Secondary | ICD-10-CM

## 2015-08-31 DIAGNOSIS — R350 Frequency of micturition: Secondary | ICD-10-CM | POA: Diagnosis not present

## 2015-08-31 DIAGNOSIS — F439 Reaction to severe stress, unspecified: Secondary | ICD-10-CM

## 2015-08-31 DIAGNOSIS — R109 Unspecified abdominal pain: Secondary | ICD-10-CM

## 2015-08-31 DIAGNOSIS — K219 Gastro-esophageal reflux disease without esophagitis: Secondary | ICD-10-CM

## 2015-08-31 DIAGNOSIS — Z658 Other specified problems related to psychosocial circumstances: Secondary | ICD-10-CM

## 2015-08-31 DIAGNOSIS — M549 Dorsalgia, unspecified: Secondary | ICD-10-CM

## 2015-08-31 DIAGNOSIS — R232 Flushing: Secondary | ICD-10-CM

## 2015-08-31 DIAGNOSIS — R300 Dysuria: Secondary | ICD-10-CM

## 2015-08-31 DIAGNOSIS — G8929 Other chronic pain: Secondary | ICD-10-CM

## 2015-08-31 DIAGNOSIS — Z86010 Personal history of colonic polyps: Secondary | ICD-10-CM

## 2015-08-31 DIAGNOSIS — C50919 Malignant neoplasm of unspecified site of unspecified female breast: Secondary | ICD-10-CM

## 2015-08-31 LAB — POCT URINALYSIS DIPSTICK
Bilirubin, UA: NEGATIVE
GLUCOSE UA: NEGATIVE
Ketones, UA: NEGATIVE
Nitrite, UA: NEGATIVE
PROTEIN UA: NEGATIVE
Spec Grav, UA: 1.02
UROBILINOGEN UA: 1
pH, UA: 7

## 2015-08-31 MED ORDER — CEPHALEXIN 500 MG PO CAPS: 500.0000 mg | ORAL_CAPSULE | Freq: Three times a day (TID) | ORAL | Status: DC

## 2015-08-31 MED ORDER — CITALOPRAM HYDROBROMIDE 40 MG PO TABS: 40.0000 mg | ORAL_TABLET | Freq: Every day | ORAL | Status: DC

## 2015-08-31 NOTE — Telephone Encounter (Signed)
Pt was seen today.

## 2015-08-31 NOTE — Progress Notes (Signed)
Pre-visit discussion using our clinic review tool. No additional management support is needed unless otherwise documented below in the visit note.  

## 2015-08-31 NOTE — Progress Notes (Signed)
Patient ID: Sherry Simon, female   DOB: 1946-07-23, 69 y.o.   MRN: [id number]   Subjective:    Patient ID: Sherry Simon, female    DOB: April 04, 1946, 69 y.o.   MRN: [id number]  HPI  Patient with past history of hypercholesterolemia, diabetes, depression, breast cancer and hypertension.  She comes in today to follow up on these issues.  She is being followed by oncology for her breast cancer.  Last mammogram 01/10/15 - birads II.  Off estrogen.  Having increased problems with hot flashes.  Recently she titrated off cymbalta.  On citalopram now.  Still with hot flashes.  Would like to increase the dose.  Reported no chest pain or tightness.  No sob.  No abdominal pain or cramping.  Bowels stable.  Her husband is doing better.  Taking care of her mother.  Increased stress.  She has been having some issues with her bladder since before Thanksgiving.  Taking AZO.  Has helped some.  Still with pain and pressure over the bladder.  Eating and drinking.     Past Medical History  Diagnosis Date  . Hypertension   . Diabetes mellitus ([place])   . Hypercholesterolemia   . Endometriosis     requiring hysterectomy  . Nephrolithiasis   . Anxiety and depression   . Pericarditis     recurrent, unkown origin  . Tachycardia   . Depression   . History of chicken pox   . Colon polyps    Past Surgical History  Procedure Laterality Date  . Oophorectomy  1982  . Cholecystectomy      open  . Tonsillectomy    . Appendectomy  1055  . Back surgery  1005-2010    laminectomy  . Abdominal hysterectomy  1980   Family History  Problem Relation Age of Onset  . Heart disease Father     myocardial infarction - died 59  . Thyroid disease Mother   . Transient ischemic attack Mother     multiple  . Breast cancer Mother   . Hyperlipidemia Mother   . Kidney disease Mother   . Diabetes Mother   . Rheumatic fever Sister     mitral valve problems  . Breast cancer Paternal Aunt   . Colon cancer Neg Hx   .  Other Sister     Small vessel disease   Social History   Social History  . Marital Status: Married    Spouse Name: N/A  . Number of Children: 3  . Years of Education: N/A   Social History Main Topics  . Smoking status: Never Smoker   . Smokeless tobacco: Never Used  . Alcohol Use: No  . Drug Use: No  . Sexual Activity: Not Asked   Other Topics Concern  . None   Social History Narrative    Outpatient Encounter Prescriptions as of 08/31/2015  Medication Sig  . acyclovir cream (ZOVIRAX) 5 % Use as directed  . aspirin 81 MG tablet Take 81 mg by mouth daily.  Marland Kitchen atorvastatin (LIPITOR) 40 MG tablet TAKE 1 TABLET BY MOUTH EVERY DAY  . gabapentin (NEURONTIN) 300 MG capsule 300 mg. Take 3 capsules in the morning & 4 at bedtime  . latanoprost (XALATAN) 0.005 % ophthalmic solution Place 1 drop into the left eye at bedtime.  Marland Kitchen losartan (COZAAR) 100 MG tablet TAKE 1 TABLET (100 MG TOTAL) BY MOUTH DAILY.  . metFORMIN (GLUCOPHAGE) 500 MG tablet TAKE 2 TABLETS BY MOUTH EVERY MORNING AND TAKE  1 TABLET AT SUPPER  . mupirocin cream (BACTROBAN) 2 % Apply 1 application topically 2 (two) times daily.  . ONE TOUCH ULTRA TEST test strip TEST BLOOD SUGAR TWICE A DAY  . oxyCODONE (OXY IR/ROXICODONE) 5 MG immediate release tablet May take up 3 tablets at a time every 6 hours  . pantoprazole (PROTONIX) 40 MG tablet TAKE 1 TABLET (40 MG TOTAL) BY MOUTH 2 (TWO) TIMES DAILY.  . [DISCONTINUED] citalopram (CELEXA) 20 MG tablet Take 1 tablet (20 mg total) by mouth daily.  . [DISCONTINUED] DULoxetine (CYMBALTA) 30 MG capsule Take 1 capsule (30 mg total) by mouth daily.  . cephALEXin (KEFLEX) 500 MG capsule Take 1 capsule (500 mg total) by mouth 3 (three) times daily.  . citalopram (CELEXA) 40 MG tablet Take 1 tablet (40 mg total) by mouth daily.   No facility-administered encounter medications on file as of 08/31/2015.    Review of Systems  Constitutional: Negative for appetite change and unexpected weight  change.  HENT: Negative for congestion and sinus pressure.   Respiratory: Negative for cough, chest tightness and shortness of breath.   Cardiovascular: Negative for chest pain, palpitations and leg swelling.  Gastrointestinal: Negative for nausea, vomiting and diarrhea.       Some abdominal pressure and discomfort over the bladder.   Endocrine:       Increased hot flashes as outlined.    Genitourinary: Positive for dysuria. Negative for difficulty urinating.  Musculoskeletal: Negative for joint swelling.       Chronic back pain.    Skin: Negative for color change and rash.  Neurological: Negative for dizziness, light-headedness and headaches.  Psychiatric/Behavioral:       Increased stress as outlined.         Objective:    Physical Exam  Constitutional: She appears well-developed and well-nourished. No distress.  HENT:  Nose: Nose normal.  Mouth/Throat: Oropharynx is clear and moist.  Eyes: Conjunctivae are normal. Right eye exhibits no discharge. Left eye exhibits no discharge.  Neck: Neck supple. No thyromegaly present.  Cardiovascular: Normal rate and regular rhythm.   Pulmonary/Chest: Breath sounds normal. No respiratory distress. She has no wheezes.  Abdominal: Soft. Bowel sounds are normal. There is no tenderness.  Musculoskeletal: She exhibits no edema or tenderness.  Lymphadenopathy:    She has no cervical adenopathy.  Skin: No rash noted. No erythema.  Psychiatric: She has a normal mood and affect. Her behavior is normal.    BP 98/60 mmHg  Pulse 76  Temp(Src) 98.3 F (36.8 C) (Oral)  Resp 18  Ht 5' 4.5" (1.638 m)  Wt 160 lb (72.576 kg)  BMI 27.05 kg/m2  SpO2 96% Wt Readings from Last 3 Encounters:  08/31/15 160 lb (72.576 kg)  05/23/15 157 lb 8 oz (71.442 kg)  02/11/15 150 lb (68.04 kg)     Lab Results  Component Value Date   WBC 4.6 05/23/2015   HGB 12.3 05/23/2015   HCT 36.8 05/23/2015   PLT 352.0 05/23/2015   GLUCOSE 99 05/23/2015   CHOL 140  05/23/2015   TRIG 90.0 05/23/2015   HDL 47.50 05/23/2015   LDLCALC 75 05/23/2015   ALT 13 06/15/2015   AST 12 06/15/2015   NA 139 05/23/2015   K 4.6 05/23/2015   CL 102 05/23/2015   CREATININE 0.57 05/23/2015   BUN 10 05/23/2015   CO2 28 05/23/2015   TSH 1.07 05/23/2015   HGBA1C 6.5 05/23/2015   MICROALBUR 0.5 08/13/2014  Assessment & Plan:   Problem List Items Addressed This Visit    Abdominal pressure    Pressure/pain over bladder.  Symptoms as outlined.  Urine dip reviewed.  Appears to be c/w uti.  Start keflex as directed.  Follow.  Await urine culture results.        Breast cancer ([place])    Followed by oncology.  Mammogram 01/10/15 - Birads II.       Chronic back pain    S/p back surgery.  Still with pain.  Sees Dr Sharlet Salina.        Diabetes mellitus ([place])    Sugars have been under good control.  Follow met b and a1c.  Low carb diet and exercise.  Follow.       GERD (gastroesophageal reflux disease)    On protonix.  Controlled.        History of colonic polyps    Colonoscopy 07/24/10 - 2 mm polyp in the ascending colon and diverticulosis.  Bowels stble.        Hot flashes    Off estrogen given her history of breast cancer.  Hot flashes still bad.  On citalopram.  Will increase to 69m q day.  Follow.        Hypercholesterolemia    Low cholesterol diet and exercise.  Follow lipid panel and liver function tests.  On lipitor.   Lab Results  Component Value Date   CHOL 140 05/23/2015   HDL 47.50 05/23/2015   LDLCALC 75 05/23/2015   TRIG 90.0 05/23/2015   CHOLHDL 3 05/23/2015        Hypertension    Blood pressure under good control.  Continue same medication regimen.  Follow pressures.  Follow metabolic panel.        Stress    No longer followed by psychiatry.  Off cymbalta now.  On citalopram.  Will titrate up.  Husband is doing better.  Still with increased stress.  Follow.         Other Visit Diagnoses    Urinary frequency    -  Primary     Relevant Orders    POCT Urinalysis Dipstick (Completed)    Dysuria        Relevant Orders    CULTURE, URINE COMPREHENSIVE (Completed)        SEinar Pheasant MD

## 2015-09-03 ENCOUNTER — Encounter: Payer: Self-pay | Admitting: Internal Medicine

## 2015-09-03 LAB — CULTURE, URINE COMPREHENSIVE: Colony Count: >=100000

## 2015-09-05 ENCOUNTER — Encounter: Payer: Self-pay | Admitting: Internal Medicine

## 2015-09-05 DIAGNOSIS — R232 Flushing: Secondary | ICD-10-CM | POA: Insufficient documentation

## 2015-09-05 NOTE — Assessment & Plan Note (Signed)
S/p back surgery.  Still with pain.  Sees Dr Sharlet Salina.

## 2015-09-05 NOTE — Assessment & Plan Note (Signed)
Off estrogen given her history of breast cancer.  Hot flashes still bad.  On citalopram.  Will increase to 40mg  q day.  Follow.

## 2015-09-05 NOTE — Assessment & Plan Note (Signed)
Low cholesterol diet and exercise.  Follow lipid panel and liver function tests.  On lipitor.   Lab Results  Component Value Date   CHOL 140 05/23/2015   HDL 47.50 05/23/2015   LDLCALC 75 05/23/2015   TRIG 90.0 05/23/2015   CHOLHDL 3 05/23/2015

## 2015-09-05 NOTE — Assessment & Plan Note (Signed)
No longer followed by psychiatry.  Off cymbalta now.  On citalopram.  Will titrate up.  Husband is doing better.  Still with increased stress.  Follow.

## 2015-09-05 NOTE — Assessment & Plan Note (Signed)
On protonix.  Controlled.   

## 2015-09-05 NOTE — Assessment & Plan Note (Signed)
Blood pressure under good control.  Continue same medication regimen.  Follow pressures.  Follow metabolic panel.   

## 2015-09-05 NOTE — Assessment & Plan Note (Signed)
Followed by oncology.  Mammogram 01/10/15 - Birads II.   

## 2015-09-05 NOTE — Assessment & Plan Note (Signed)
Sugars have been under good control.  Follow met b and a1c.  Low carb diet and exercise.  Follow.

## 2015-09-05 NOTE — Assessment & Plan Note (Signed)
Pressure/pain over bladder.  Symptoms as outlined.  Urine dip reviewed.  Appears to be c/w uti.  Start keflex as directed.  Follow.  Await urine culture results.

## 2015-09-05 NOTE — Assessment & Plan Note (Signed)
Colonoscopy 07/24/10 - 2 mm polyp in the ascending colon and diverticulosis.  Bowels stble.

## 2015-09-06 NOTE — Telephone Encounter (Signed)
Pt states that her sx's have not completely resolved but they are improving. She has about 3 more days of Abx left & will let us know if sx's continue after completing Abx.

## 2015-09-08 DIAGNOSIS — M5416 Radiculopathy, lumbar region: Secondary | ICD-10-CM | POA: Diagnosis not present

## 2015-09-08 DIAGNOSIS — M5136 Other intervertebral disc degeneration, lumbar region: Secondary | ICD-10-CM | POA: Diagnosis not present

## 2015-09-12 DIAGNOSIS — R232 Flushing: Secondary | ICD-10-CM | POA: Diagnosis not present

## 2015-09-12 DIAGNOSIS — C50911 Malignant neoplasm of unspecified site of right female breast: Secondary | ICD-10-CM | POA: Diagnosis not present

## 2015-09-21 ENCOUNTER — Telehealth: Payer: Self-pay | Admitting: Internal Medicine

## 2015-09-21 NOTE — Telephone Encounter (Signed)
You saw the patient on 08/31/15, gave Keflex for 7 days, please advise?

## 2015-09-21 NOTE — Telephone Encounter (Signed)
Given that it is reoccurring and new symptoms, would need to be seen and give another urine.  Let her know that I am not in office rest of today or rest of week.  Can either be seen here or in [place] if closer for her.

## 2015-09-21 NOTE — Telephone Encounter (Signed)
Made an appointment with Dr. Lacinda Axon for Friday per patient request.

## 2015-09-21 NOTE — Telephone Encounter (Signed)
Pt called stating that her symptoms of the UTI is back again. Pt wants to know can another prescription be called in or does she need to come in? Pharmacy is [id number] - [place], [place]. Thank you!

## 2015-09-23 ENCOUNTER — Ambulatory Visit: Payer: Commercial Managed Care - PPO | Admitting: Family Medicine

## 2015-09-28 ENCOUNTER — Ambulatory Visit: Payer: Commercial Managed Care - PPO | Admitting: Family Medicine

## 2015-09-28 ENCOUNTER — Encounter: Payer: Self-pay | Admitting: Family Medicine

## 2015-09-28 ENCOUNTER — Ambulatory Visit (INDEPENDENT_AMBULATORY_CARE_PROVIDER_SITE_OTHER): Payer: Medicare Other | Admitting: Family Medicine

## 2015-09-28 DIAGNOSIS — N3001 Acute cystitis with hematuria: Secondary | ICD-10-CM

## 2015-09-28 DIAGNOSIS — N390 Urinary tract infection, site not specified: Secondary | ICD-10-CM | POA: Insufficient documentation

## 2015-09-28 DIAGNOSIS — N3000 Acute cystitis without hematuria: Secondary | ICD-10-CM | POA: Diagnosis not present

## 2015-09-28 LAB — POCT URINALYSIS DIPSTICK
BILIRUBIN UA: NEGATIVE
GLUCOSE UA: NEGATIVE
Ketones, UA: NEGATIVE
NITRITE UA: NEGATIVE
Protein, UA: NEGATIVE
SPEC GRAV UA: 1.01
Urobilinogen, UA: 0.2
pH, UA: 6.5

## 2015-09-28 MED ORDER — NITROFURANTOIN MONOHYD MACRO 100 MG PO CAPS: 100.0000 mg | ORAL_CAPSULE | Freq: Two times a day (BID) | ORAL | Status: DC

## 2015-09-28 NOTE — Assessment & Plan Note (Addendum)
Patient with recent UTI less a month ago (established problem; now with recurrence). I reviewed the prior note from Dr. Nicki Reaper (12/7).  In summary, patient had suprapubic pain/pressure before Thanksgiving. Urinalysis was obtained at that time and was consistent with UTI. She was treated empirically with Keflex. I reviewed her most recent labs as well as her prior urine culture. Her urine culture revealed Escherichia coli which was resistant to Bactrim, Levaquin, and Cipro. Her symptoms and urinalysis are consistent with UTI today. Unclear the reason that she's had a recurrence. Treating with Macrobid given recent use of Keflex as well as the resistance as outlined above.

## 2015-09-28 NOTE — Patient Instructions (Signed)
Take the medication twice daily as prescribed.  Follow up closely with Dr. Nicki Reaper  Take care  Dr. Lacinda Axon

## 2015-09-28 NOTE — Progress Notes (Signed)
Pre visit review using our clinic review tool, if applicable. No additional management support is needed unless otherwise documented below in the visit note. 

## 2015-09-28 NOTE — Progress Notes (Signed)
Subjective:  Patient ID: Sherry Simon, female    DOB: 07/18/1946  Age: 70 y.o. MRN: [id number]  CC: UTI  HPI:  70 year old female with past history of DM 2, hypertension, hyperlipidemia presents for an acute visit with concerns for UTI.  Patient states that for the past 10 days she's been experiencing suprapubic pain/pressure, dysuria, and urinary frequency. She was recently treated for UTI. She states that she completed treatment without difficulty. No known exacerbating factors. She's been taking Azo with some relief. No associated fevers or chills. No reports of flank pain. No associated nausea or vomiting.  Social Hx   Social History   Social History  . Marital Status: Married    Spouse Name: N/A  . Number of Children: 3  . Years of Education: N/A   Social History Main Topics  . Smoking status: Never Smoker   . Smokeless tobacco: Never Used  . Alcohol Use: No  . Drug Use: No  . Sexual Activity: Not Asked   Other Topics Concern  . None   Social History Narrative    Review of Systems  Constitutional: Negative for fever.  Genitourinary: Positive for dysuria and frequency.   Objective:  BP 122/64 mmHg  Pulse 75  Temp(Src) 98 F (36.7 C) (Oral)  Ht 5' 4.5" (1.638 m)  Wt 160 lb 12 oz (72.916 kg)  BMI 27.18 kg/m2  SpO2 98%  BP/Weight 09/28/2015 08/31/2015 [id number]  Systolic BP 123XX123 98 123XX123  Diastolic BP 64 60 70  Wt. (Lbs) 160.75 160 157.5  BMI 27.18 27.05 27.02    Physical Exam  Constitutional: She appears well-developed. No distress.  Cardiovascular: Normal rate and regular rhythm.   Pulmonary/Chest: Effort normal and breath sounds normal. No respiratory distress. She has no wheezes. She has no rales.  Abdominal: Soft. She exhibits no distension. There is no rebound and no guarding.  Tender to palpation in the suprapubic region.   Neurological: She is alert.  Psychiatric: She has a normal mood and affect.  Vitals reviewed.   Lab Results  Component  Value Date   WBC 4.6 05/23/2015   HGB 12.3 05/23/2015   HCT 36.8 05/23/2015   PLT 352.0 05/23/2015   GLUCOSE 99 05/23/2015   CHOL 140 05/23/2015   TRIG 90.0 05/23/2015   HDL 47.50 05/23/2015   LDLCALC 75 05/23/2015   ALT 13 06/15/2015   AST 12 06/15/2015   NA 139 05/23/2015   K 4.6 05/23/2015   CL 102 05/23/2015   CREATININE 0.57 05/23/2015   BUN 10 05/23/2015   CO2 28 05/23/2015   TSH 1.07 05/23/2015   HGBA1C 6.5 05/23/2015   MICROALBUR 0.5 08/13/2014    Assessment & Plan:   Problem List Items Addressed This Visit    UTI (urinary tract infection)    Patient with recent UTI less a month ago (established problem; now with recurrence). I reviewed the prior note from Dr. Nicki Reaper (12/7).  In summary, patient had suprapubic pain/pressure before Thanksgiving. Urinalysis was obtained at that time and was consistent with UTI. She was treated empirically with Keflex. I reviewed her most recent labs as well as her prior urine culture. Her urine culture revealed Escherichia coli which was resistant to Bactrim, Levaquin, and Cipro. Her symptoms and urinalysis are consistent with UTI today. Unclear the reason that she's had a recurrence. Treating with Macrobid given recent use of Keflex as well as the resistance as outlined above.      Relevant Medications   nitrofurantoin, macrocrystal-monohydrate, (  MACROBID) 100 MG capsule   Other Relevant Orders   POCT Urinalysis Dipstick (Completed)   Urine culture      Meds ordered this encounter  Medications  . nitrofurantoin, macrocrystal-monohydrate, (MACROBID) 100 MG capsule    Sig: Take 1 capsule (100 mg total) by mouth 2 (two) times daily.    Dispense:  14 capsule    Refill:  0    Follow-up: PRN  [place]

## 2015-10-01 LAB — URINE CULTURE: Colony Count: >=100000

## 2015-10-03 ENCOUNTER — Other Ambulatory Visit: Payer: Self-pay | Admitting: Family Medicine

## 2015-10-03 MED ORDER — SULFAMETHOXAZOLE-TRIMETHOPRIM 800-160 MG PO TABS: 1.0000 | ORAL_TABLET | Freq: Two times a day (BID) | ORAL | Status: DC

## 2015-10-11 ENCOUNTER — Ambulatory Visit (INDEPENDENT_AMBULATORY_CARE_PROVIDER_SITE_OTHER): Payer: Medicare Other

## 2015-10-11 VITALS — BP 118/70 | HR 63 | Temp 97.1°F | Resp 14 | Ht 64.0 in | Wt 159.4 lb

## 2015-10-11 DIAGNOSIS — R300 Dysuria: Secondary | ICD-10-CM | POA: Diagnosis not present

## 2015-10-11 DIAGNOSIS — Z0000 Encounter for general adult medical examination without abnormal findings: Secondary | ICD-10-CM

## 2015-10-11 DIAGNOSIS — Z1159 Encounter for screening for other viral diseases: Secondary | ICD-10-CM | POA: Diagnosis not present

## 2015-10-11 LAB — POCT URINALYSIS DIPSTICK
Bilirubin, UA: NEGATIVE
GLUCOSE UA: NEGATIVE
Ketones, UA: NEGATIVE
Leukocytes, UA: NEGATIVE
NITRITE UA: NEGATIVE
PROTEIN UA: NEGATIVE
RBC UA: NEGATIVE
SPEC GRAV UA: 1.015
UROBILINOGEN UA: 0.2
pH, UA: 7

## 2015-10-11 NOTE — Progress Notes (Signed)
Subjective:   Sherry Simon is a 70 y.o. female who presents for an Initial Medicare Annual Wellness Visit.  Review of Systems    No ROS.  Medicare Wellness Visit.  Cardiac Risk Factors include: diabetes mellitus;hypertension     Objective:    Today's Vitals   10/11/15 1116 10/11/15 1119  BP: 118/70   Pulse: 63   Temp: 97.1 F (36.2 C)   TempSrc: Oral   Resp: 14   Height: 5\' 4"  (1.626 m)   Weight: 159 lb 6.4 oz (72.303 kg)   SpO2: 97%   PainSc:  2     Current Medications (verified) Outpatient Encounter Prescriptions as of 10/11/2015  Medication Sig  . aspirin 81 MG tablet Take 81 mg by mouth daily.  Marland Kitchen atorvastatin (LIPITOR) 40 MG tablet TAKE 1 TABLET BY MOUTH EVERY DAY  . citalopram (CELEXA) 40 MG tablet Take 1 tablet (40 mg total) by mouth daily.  Marland Kitchen gabapentin (NEURONTIN) 300 MG capsule 300 mg. Take 3 capsules in the morning & 4 at bedtime  . latanoprost (XALATAN) 0.005 % ophthalmic solution Place 1 drop into the left eye at bedtime.  Marland Kitchen losartan (COZAAR) 100 MG tablet TAKE 1 TABLET (100 MG TOTAL) BY MOUTH DAILY.  . metFORMIN (GLUCOPHAGE) 500 MG tablet TAKE 2 TABLETS BY MOUTH EVERY MORNING AND TAKE 1 TABLET AT SUPPER  . ONE TOUCH ULTRA TEST test strip TEST BLOOD SUGAR TWICE A DAY  . oxyCODONE (OXY IR/ROXICODONE) 5 MG immediate release tablet May take up 3 tablets at a time every 6 hours  . pantoprazole (PROTONIX) 40 MG tablet TAKE 1 TABLET (40 MG TOTAL) BY MOUTH 2 (TWO) TIMES DAILY.  . tamoxifen (NOLVADEX) 20 MG tablet Take by mouth.  . Calcium Carbonate-Vitamin D 600-400 MG-UNIT tablet Take by mouth.  . [DISCONTINUED] acyclovir cream (ZOVIRAX) 5 % Use as directed  . [DISCONTINUED] cephALEXin (KEFLEX) 500 MG capsule Take 1 capsule (500 mg total) by mouth 3 (three) times daily. (Patient not taking: Reported on 09/28/2015)  . [DISCONTINUED] mupirocin cream (BACTROBAN) 2 % Apply 1 application topically 2 (two) times daily. (Patient not taking: Reported on 09/28/2015)  .  [DISCONTINUED] sulfamethoxazole-trimethoprim (BACTRIM DS,SEPTRA DS) 800-160 MG tablet Take 1 tablet by mouth 2 (two) times daily.   No facility-administered encounter medications on file as of 10/11/2015.    Allergies (verified) Ivp dye   History: Past Medical History  Diagnosis Date  . Hypertension   . Diabetes mellitus ([place])   . Hypercholesterolemia   . Endometriosis     requiring hysterectomy  . Nephrolithiasis   . Anxiety and depression   . Pericarditis     recurrent, unkown origin  . Tachycardia   . Depression   . History of chicken pox   . Colon polyps    Past Surgical History  Procedure Laterality Date  . Oophorectomy  1982  . Cholecystectomy      open  . Tonsillectomy    . Appendectomy  1055  . Back surgery  1005-2010    laminectomy  . Abdominal hysterectomy  1980   Family History  Problem Relation Age of Onset  . Heart disease Father     myocardial infarction - died 6  . Thyroid disease Mother   . Transient ischemic attack Mother     multiple  . Breast cancer Mother   . Hyperlipidemia Mother   . Kidney disease Mother   . Diabetes Mother   . Rheumatic fever Sister     mitral  valve problems  . Breast cancer Paternal Aunt   . Colon cancer Neg Hx   . Other Sister     Small vessel disease   Social History   Occupational History  . Not on file.   Social History Main Topics  . Smoking status: Never Smoker   . Smokeless tobacco: Never Used  . Alcohol Use: No  . Drug Use: No  . Sexual Activity: No    Tobacco Counseling Counseling given: Not Answered   Activities of Daily Living In your present state of health, do you have any difficulty performing the following activities: 10/11/2015  Hearing? Y  Vision? N  Difficulty concentrating or making decisions? N  Walking or climbing stairs? N  Dressing or bathing? N  Doing errands, shopping? N  Preparing Food and eating ? N  Using the Toilet? N  In the past six months, have you accidently leaked  urine? N  Do you have problems with loss of bowel control? N  Managing your Medications? N  Managing your Finances? N  Housekeeping or managing your Housekeeping? N    Immunizations and Health Maintenance Immunization History  Administered Date(s) Administered  . Influenza,inj,Quad PF,36+ Mos 06/18/2014, 05/23/2015   Health Maintenance Due  Topic Date Due  . TETANUS/TDAP  03/04/1965  . ZOSTAVAX  03/04/2006  . PNA vac Low Risk Adult (1 of 2 - PCV13) 03/05/2011  . MAMMOGRAM  11/20/2014  . COLONOSCOPY  07/25/2015    Patient Care Team: Einar Pheasant, MD as PCP - General (Internal Medicine)  Indicate any recent Medical Services you may have received from other than [facility] providers in the past year (date may be approximate).     Assessment:   This is a routine wellness examination for [place].  The goal of the wellness visit is to assist the patient how to close the gaps in care and create a preventative care plan for the patient.   VIT D Calcium as appropriate/ Osteoporosis risk reviewed.  Medications reviewed; taking without issues or barriers.  Safety issues reviewed; smoke detectors in the home. No firearms in the home. Wears seatbelts when driving or riding with others. No violence in the home.  The patient was oriented x 3; appropriate in dress and manner and no objective failures at ADL's or IADL's.   TDAP and ZOSTAVAX vaccine postponed per patient request.  Follow up with insurance.  Hep C Screening completed.  PCV13 declined, per patient request.  Mammogram appointment scheduled.  Colonoscopy postponed per patient request for upcoming follow up appointment.  Malign neopl breast-stable and followed by Dr. Harden Mo DMII wo cmp nt st u-stable and followed by PCP Malignant neoplasm-stable and followed by Dr. Harden Mo Type 2 diabetes-stable and followed by PCP  Patient Concerns:  Returning symptoms of UTI; recent antibiotic completed.  PCP aware, POCT and  Culture ordered.  Bilateral leg cramping; worse at bedtime.  Deferred to PCP for follow up.    Hearing/Vision screen Hearing Screening Comments: Followed by Dr. Kathyrn Sheriff Moderate hearing loss Does not wear hearing aids Vision Screening Comments: Followed by [facility], Dr. Volanda Napoleon Visits q 3 months Partial retina detachment Glaucoma  Dietary issues and exercise activities discussed: Current Exercise Habits:: The patient does not participate in regular exercise at present  Goals    . Healthy Lifestyle     Start water aerobics with a friend.  Start chair exercises as demonstrated, as tolerated.  Education provided. Drink plenty of water and make healthy choices when eating.  Depression Screen PHQ 2/9 Scores 10/11/2015 11/04/2014 10/15/2013 08/24/2012  PHQ - 2 Score 0 3 0 1  PHQ- 9 Score - 6 - -    Fall Risk Fall Risk  10/11/2015 11/04/2014 10/15/2013 10/22/2012 08/24/2012  Falls in the past year? No No No No No    Cognitive Function: MMSE - Mini Mental State Exam 10/11/2015  Orientation to time 5  Orientation to Place 5  Registration 3  Attention/ Calculation 5  Recall 3  Language- name 2 objects 2  Language- repeat 1  Language- follow 3 step command 3  Language- read & follow direction 1  Write a sentence 1  Copy design 1  Total score 30    Screening Tests Health Maintenance  Topic Date Due  . TETANUS/TDAP  03/04/1965  . ZOSTAVAX  03/04/2006  . PNA vac Low Risk Adult (1 of 2 - PCV13) 03/05/2011  . MAMMOGRAM  11/20/2014  . COLONOSCOPY  07/25/2015  . HEMOGLOBIN A1C  11/22/2015  . INFLUENZA VACCINE  04/24/2016  . OPHTHALMOLOGY EXAM  05/31/2016  . FOOT EXAM  08/30/2016  . DEXA SCAN  Completed  . Hepatitis C Screening  Completed      Plan:    End of life planning; Advance aging; Advanced directives discussed. Copy requested of current HCPOA/Living Will.    During the course of the visit, Tayllor was educated and counseled about the following appropriate  screening and preventive services:   Vaccines to include Pneumoccal, Influenza, Hepatitis B, Td, Zostavax, HCV  Electrocardiogram  Cardiovascular disease screening  Colorectal cancer screening  Bone density screening  Diabetes screening  Glaucoma screening  Mammography/PAP  Nutrition counseling  Smoking cessation counseling  Patient Instructions (the written plan) were given to the patient.    Varney Biles, LPN   [id number]    Reviewed above information.  Plan to follow up as outlined.    Dr Nicki Reaper

## 2015-10-11 NOTE — Patient Instructions (Addendum)
Sherry Simon,  Thank you for taking time to come for your Medicare Wellness Visit.  I appreciate your ongoing commitment to your health goals. Please review the following plan we discussed and let me know if I can assist you in the future.   Hearing Loss Hearing loss is a partial or total loss of the ability to hear. This can be temporary or permanent, and it can happen in one or both ears. Hearing loss may be referred to as deafness. Medical care is necessary to treat hearing loss properly and to prevent the condition from getting worse. Your hearing may partially or completely come back, depending on what caused your hearing loss and how severe it is. In some cases, hearing loss is permanent. CAUSES Common causes of hearing loss include:   Too much wax in the ear canal.   Infection of the ear canal or middle ear.   Fluid in the middle ear.   Injury to the ear or surrounding area.   An object stuck in the ear.   Prolonged exposure to loud sounds, such as music.  Less common causes of hearing loss include:   Tumors in the ear.   Viral or bacterial infections, such as meningitis.   A hole in the eardrum (perforated eardrum).  Problems with the hearing nerve that sends signals between the brain and the ear.  Certain medicines.  SYMPTOMS  Symptoms of this condition may include:  Difficulty telling the difference between sounds.  Difficulty following a conversation when there is background noise.  Lack of response to sounds in your environment. This may be most noticeable when you do not respond to startling sounds.  Needing to turn up the volume on the television, radio, etc.  Ringing in the ears.  Dizziness.  Pain in the ears. DIAGNOSIS This condition is diagnosed based on a physical exam and a hearing test (audiometry). The audiometry test will be performed by a hearing specialist (audiologist). You may also be referred to an ear, nose, and throat (ENT)  specialist (otolaryngologist).  TREATMENT Treatment for recent onset of hearing loss may include:   Ear wax removal.   Being prescribed medicines to prevent infection (antibiotics).   Being prescribed medicines to reduce inflammation (corticosteroids).  HOME CARE INSTRUCTIONS  If you were prescribed an antibiotic medicine, take it as told by your health care provider. Do not stop taking the antibiotic even if you start to feel better.  Take over-the-counter and prescription medicines only as told by your health care provider.  Avoid loud noises.   Return to your normal activities as told by your health care provider. Ask your health care provider what activities are safe for you.  Keep all follow-up visits as told by your health care provider. This is important. SEEK MEDICAL CARE IF:   You feel dizzy.   You develop new symptoms.   You vomit or feel nauseous.   You have a fever.  SEEK IMMEDIATE MEDICAL CARE IF:  You develop sudden changes in your vision.   You have severe ear pain.   You have new or increased weakness.  You have a severe headache.   This information is not intended to replace advice given to you by your health care provider. Make sure you discuss any questions you have with your health care provider.   Document Released: 09/10/2005 Document Revised: 06/01/2015 Document Reviewed: 01/26/2015 Elsevier Interactive Patient Education [employer].

## 2015-10-12 ENCOUNTER — Encounter: Payer: Self-pay | Admitting: Internal Medicine

## 2015-10-12 DIAGNOSIS — Z1159 Encounter for screening for other viral diseases: Secondary | ICD-10-CM | POA: Diagnosis not present

## 2015-10-12 DIAGNOSIS — Z0000 Encounter for general adult medical examination without abnormal findings: Secondary | ICD-10-CM | POA: Diagnosis not present

## 2015-10-12 DIAGNOSIS — R300 Dysuria: Secondary | ICD-10-CM | POA: Diagnosis not present

## 2015-10-12 LAB — HEPATITIS C ANTIBODY: HCV AB: NEGATIVE

## 2015-10-12 NOTE — Addendum Note (Signed)
Addended by: Karlene Einstein D on: 10/12/2015 08:16 AM   Modules accepted: Orders

## 2015-10-14 NOTE — Telephone Encounter (Signed)
Unread mychart message mailed to patient 

## 2015-10-15 ENCOUNTER — Encounter: Payer: Self-pay | Admitting: Internal Medicine

## 2015-10-15 LAB — URINE CULTURE: Colony Count: 50000

## 2015-10-17 MED ORDER — CEFDINIR 300 MG PO CAPS: 300.0000 mg | ORAL_CAPSULE | Freq: Two times a day (BID) | ORAL | Status: DC

## 2015-10-17 NOTE — Telephone Encounter (Signed)
Pt states that she is still having urinary sx's. C/O pressure, frequency, & pain after urinating. She is not allergic to any antibiotics & aware to check her pharmacy at the end of the day for her medication.

## 2015-10-17 NOTE — Telephone Encounter (Signed)
rx sent in for abx for urinary tract infection.  See my chart message.

## 2015-11-03 ENCOUNTER — Encounter: Payer: Self-pay | Admitting: Internal Medicine

## 2015-11-03 ENCOUNTER — Ambulatory Visit (INDEPENDENT_AMBULATORY_CARE_PROVIDER_SITE_OTHER): Payer: Medicare Other | Admitting: Internal Medicine

## 2015-11-03 VITALS — BP 90/54 | HR 82 | Temp 98.5°F | Ht 63.5 in | Wt 161.1 lb

## 2015-11-03 DIAGNOSIS — K219 Gastro-esophageal reflux disease without esophagitis: Secondary | ICD-10-CM | POA: Diagnosis not present

## 2015-11-03 DIAGNOSIS — E7800 Pure hypercholesterolemia, unspecified: Secondary | ICD-10-CM

## 2015-11-03 DIAGNOSIS — I10 Essential (primary) hypertension: Secondary | ICD-10-CM | POA: Diagnosis not present

## 2015-11-03 DIAGNOSIS — Z658 Other specified problems related to psychosocial circumstances: Secondary | ICD-10-CM | POA: Diagnosis not present

## 2015-11-03 DIAGNOSIS — E119 Type 2 diabetes mellitus without complications: Secondary | ICD-10-CM

## 2015-11-03 DIAGNOSIS — Z0000 Encounter for general adult medical examination without abnormal findings: Secondary | ICD-10-CM

## 2015-11-03 DIAGNOSIS — G8929 Other chronic pain: Secondary | ICD-10-CM

## 2015-11-03 DIAGNOSIS — M549 Dorsalgia, unspecified: Secondary | ICD-10-CM

## 2015-11-03 DIAGNOSIS — C50919 Malignant neoplasm of unspecified site of unspecified female breast: Secondary | ICD-10-CM

## 2015-11-03 DIAGNOSIS — N951 Menopausal and female climacteric states: Secondary | ICD-10-CM

## 2015-11-03 DIAGNOSIS — R1031 Right lower quadrant pain: Secondary | ICD-10-CM

## 2015-11-03 DIAGNOSIS — R232 Flushing: Secondary | ICD-10-CM

## 2015-11-03 DIAGNOSIS — F439 Reaction to severe stress, unspecified: Secondary | ICD-10-CM

## 2015-11-03 DIAGNOSIS — Z86010 Personal history of colonic polyps: Secondary | ICD-10-CM

## 2015-11-03 NOTE — Progress Notes (Signed)
Pre visit review using our clinic review tool, if applicable. No additional management support is needed unless otherwise documented below in the visit note. 

## 2015-11-03 NOTE — Progress Notes (Signed)
Patient ID: Sherry Simon, female   DOB: [birth year], 70 y.o.   MRN: [id number]   Subjective:    Patient ID: Sherry Simon, female    DOB: April 07, 1946, 70 y.o.   MRN: [id number]  HPI  Patient with past history of hypercholesterolemia, diabetes, anxiety and depression, hypertension and menopausal symptoms.  She comes in today to follow up on these issues as well as for a complete physical exam.  She is feeling better on citalopram.  More level.  Not taking tamoxifen.  Still with hot flashes, but better and feels better.  Has been off since Christmas Day.  Has f/u in April with her oncologist.  She has noticed some increased pressure/discomfort right side and right lower quadrant.  Intermittent pain.  Occurs every 3-4 days.  May last two minutes.  No bowel change.  No cardiac symptoms with increased activity or exertion.  Breathing stable.     Past Medical History  Diagnosis Date  . Hypertension   . Diabetes mellitus ([place])   . Hypercholesterolemia   . Endometriosis     requiring hysterectomy  . Nephrolithiasis   . Anxiety and depression   . Pericarditis     recurrent, unkown origin  . Tachycardia   . Depression   . History of chicken pox   . Colon polyps    Past Surgical History  Procedure Laterality Date  . Oophorectomy  1982  . Cholecystectomy      open  . Tonsillectomy    . Appendectomy  1055  . Back surgery  1005-2010    laminectomy  . Abdominal hysterectomy  1980   Family History  Problem Relation Age of Onset  . Heart disease Father     myocardial infarction - died 24  . Thyroid disease Mother   . Transient ischemic attack Mother     multiple  . Breast cancer Mother   . Hyperlipidemia Mother   . Kidney disease Mother   . Diabetes Mother   . Rheumatic fever Sister     mitral valve problems  . Breast cancer Paternal Aunt   . Colon cancer Neg Hx   . Other Sister     Small vessel disease   Social History   Social History  . Marital Status: Married   Spouse Name: N/A  . Number of Children: 3  . Years of Education: N/A   Social History Main Topics  . Smoking status: Never Smoker   . Smokeless tobacco: Never Used  . Alcohol Use: No  . Drug Use: No  . Sexual Activity: No   Other Topics Concern  . None   Social History Narrative    Outpatient Encounter Prescriptions as of 11/03/2015  Medication Sig  . aspirin 81 MG tablet Take 81 mg by mouth daily.  Marland Kitchen atorvastatin (LIPITOR) 40 MG tablet TAKE 1 TABLET BY MOUTH EVERY DAY  . Calcium Carbonate-Vitamin D 600-400 MG-UNIT tablet Take by mouth.  . cefdinir (OMNICEF) 300 MG capsule Take 1 capsule (300 mg total) by mouth 2 (two) times daily.  . citalopram (CELEXA) 40 MG tablet Take 1 tablet (40 mg total) by mouth daily.  Marland Kitchen gabapentin (NEURONTIN) 300 MG capsule 300 mg. Take 3 capsules in the morning & 4 at bedtime  . latanoprost (XALATAN) 0.005 % ophthalmic solution Place 1 drop into the left eye at bedtime.  Marland Kitchen losartan (COZAAR) 100 MG tablet TAKE 1 TABLET (100 MG TOTAL) BY MOUTH DAILY.  . metFORMIN (GLUCOPHAGE) 500 MG tablet TAKE 2  TABLETS BY MOUTH EVERY MORNING AND TAKE 1 TABLET AT SUPPER  . ONE TOUCH ULTRA TEST test strip TEST BLOOD SUGAR TWICE A DAY  . oxyCODONE (OXY IR/ROXICODONE) 5 MG immediate release tablet May take up 3 tablets at a time every 6 hours  . pantoprazole (PROTONIX) 40 MG tablet TAKE 1 TABLET (40 MG TOTAL) BY MOUTH 2 (TWO) TIMES DAILY.  . tamoxifen (NOLVADEX) 20 MG tablet Take by mouth.   No facility-administered encounter medications on file as of 11/03/2015.    Review of Systems  Constitutional: Negative for appetite change and unexpected weight change.  HENT: Negative for congestion and sinus pressure.   Eyes: Negative for pain and visual disturbance.  Respiratory: Negative for cough, chest tightness and shortness of breath.   Cardiovascular: Negative for chest pain, palpitations and leg swelling.  Gastrointestinal: Positive for abdominal pain. Negative for nausea,  vomiting and diarrhea.  Genitourinary: Negative for dysuria and difficulty urinating.  Musculoskeletal: Positive for back pain. Negative for joint swelling.  Skin: Negative for color change and rash.  Neurological: Negative for dizziness, light-headedness and headaches.  Hematological: Negative for adenopathy. Does not bruise/bleed easily.  Psychiatric/Behavioral: Negative for dysphoric mood and agitation.       Objective:     Blood pressure rechecked by me:  122/68  Physical Exam  Constitutional: She is oriented to person, place, and time. She appears well-developed and well-nourished. No distress.  HENT:  Nose: Nose normal.  Mouth/Throat: Oropharynx is clear and moist.  Eyes: Right eye exhibits no discharge. Left eye exhibits no discharge. No scleral icterus.  Neck: Neck supple. No thyromegaly present.  Cardiovascular: Normal rate and regular rhythm.   Pulmonary/Chest: Breath sounds normal. No accessory muscle usage. No tachypnea. No respiratory distress. She has no decreased breath sounds. She has no wheezes. She has no rhonchi. Right breast exhibits no inverted nipple, no mass, no nipple discharge and no tenderness (no axillary adenopathy). Left breast exhibits no inverted nipple, no mass, no nipple discharge and no tenderness (no axilarry adenopathy).  Abdominal: Soft. Bowel sounds are normal. There is no tenderness.  Musculoskeletal: She exhibits no edema or tenderness.  Lymphadenopathy:    She has no cervical adenopathy.  Neurological: She is alert and oriented to person, place, and time.  Skin: Skin is warm. No rash noted. No erythema.  Psychiatric: She has a normal mood and affect. Her behavior is normal.    BP 90/54 mmHg  Pulse 82  Temp(Src) 98.5 F (36.9 C) (Oral)  Ht 5' 3.5" (1.613 m)  Wt 161 lb 2 oz (73.086 kg)  BMI 28.09 kg/m2  SpO2 96% Wt Readings from Last 3 Encounters:  11/03/15 161 lb 2 oz (73.086 kg)  10/11/15 159 lb 6.4 oz (72.303 kg)  09/28/15 160 lb 12  oz (72.916 kg)     Lab Results  Component Value Date   WBC 4.6 05/23/2015   HGB 12.3 05/23/2015   HCT 36.8 05/23/2015   PLT 352.0 05/23/2015   GLUCOSE 99 05/23/2015   CHOL 140 05/23/2015   TRIG 90.0 05/23/2015   HDL 47.50 05/23/2015   LDLCALC 75 05/23/2015   ALT 13 06/15/2015   AST 12 06/15/2015   NA 139 05/23/2015   K 4.6 05/23/2015   CL 102 05/23/2015   CREATININE 0.57 05/23/2015   BUN 10 05/23/2015   CO2 28 05/23/2015   TSH 1.07 05/23/2015   HGBA1C 6.5 05/23/2015   MICROALBUR 0.5 08/13/2014       Assessment & Plan:  Problem List Items Addressed This Visit    Abdominal pain    Persistent pressure/discomfort - RLQ and right side.  Check abdominal ultrasound and pelvic ultrasound.  Further w/up pending results.        Relevant Orders   US Abdomen Complete   US Transvaginal Non-OB   US Pelvis Complete   Breast cancer ([place])    Has been followed by oncology.  Was on tamoxifen.  Plans to discuss with oncology.  Hot flashes are some better.  Follow.       Chronic back pain    S/p back surgery.  Still with pain.  Sees Dr Sharlet Salina.       Diabetes mellitus ([place])    Low carb diet and exercise.  Follow met b and a1c.        Relevant Orders   Hemoglobin A1c   GERD (gastroesophageal reflux disease)    On protonix.  Symptoms controlled.        History of colonic polyps    Colonoscopy 07/24/10 as outlined in overview.        Hot flashes    Off estrogen given her breast cancer.  Still with hot flashes.  Are better.  Follow.        Hypercholesterolemia    On lipitor.  Low cholesterol diet and exercise.  Follow lipid panel and liver function tests.        Relevant Orders   Lipid panel   Hepatic function panel   Hypertension - Primary    Blood pressure under good control.  Continue same medication regimen.  Follow pressures.  Follow metabolic panel.        Relevant Orders   Basic metabolic panel   Stress    On citalopram and doing better.  Follow.              Einar Pheasant, MD

## 2015-11-06 ENCOUNTER — Encounter: Payer: Self-pay | Admitting: Internal Medicine

## 2015-11-06 DIAGNOSIS — R109 Unspecified abdominal pain: Secondary | ICD-10-CM | POA: Insufficient documentation

## 2015-11-06 NOTE — Assessment & Plan Note (Signed)
On citalopram and doing better.  Follow.

## 2015-11-06 NOTE — Assessment & Plan Note (Signed)
Blood pressure under good control.  Continue same medication regimen.  Follow pressures.  Follow metabolic panel.   

## 2015-11-06 NOTE — Assessment & Plan Note (Signed)
Has been followed by oncology.  Was on tamoxifen.  Plans to discuss with oncology.  Hot flashes are some better.  Follow.

## 2015-11-06 NOTE — Assessment & Plan Note (Signed)
On lipitor.  Low cholesterol diet and exercise.  Follow lipid panel and liver function tests.   

## 2015-11-06 NOTE — Assessment & Plan Note (Signed)
On protonix.  Symptoms controlled.   

## 2015-11-06 NOTE — Assessment & Plan Note (Signed)
S/p back surgery.  Still with pain.  Sees Dr Sharlet Salina.

## 2015-11-06 NOTE — Assessment & Plan Note (Signed)
Colonoscopy 07/24/10 as outlined in overview.

## 2015-11-06 NOTE — Assessment & Plan Note (Signed)
Low carb diet and exercise.  Follow met b and a1c.   

## 2015-11-06 NOTE — Assessment & Plan Note (Signed)
Off estrogen given her breast cancer.  Still with hot flashes.  Are better.  Follow.

## 2015-11-06 NOTE — Assessment & Plan Note (Signed)
Persistent pressure/discomfort - RLQ and right side.  Check abdominal ultrasound and pelvic ultrasound.  Further w/up pending results.

## 2015-11-07 ENCOUNTER — Other Ambulatory Visit: Payer: Self-pay | Admitting: Internal Medicine

## 2015-11-10 ENCOUNTER — Ambulatory Visit
Admission: RE | Admit: 2015-11-10 | Discharge: 2015-11-10 | Disposition: A | Discharge status: Home / Self Care | Payer: Medicare Other | Source: Ambulatory Visit | Attending: Internal Medicine | Admitting: Internal Medicine

## 2015-11-10 DIAGNOSIS — Z9089 Acquired absence of other organs: Secondary | ICD-10-CM | POA: Insufficient documentation

## 2015-11-10 DIAGNOSIS — Z9049 Acquired absence of other specified parts of digestive tract: Secondary | ICD-10-CM | POA: Insufficient documentation

## 2015-11-10 DIAGNOSIS — R1031 Right lower quadrant pain: Secondary | ICD-10-CM | POA: Insufficient documentation

## 2015-11-10 DIAGNOSIS — Z780 Asymptomatic menopausal state: Secondary | ICD-10-CM | POA: Insufficient documentation

## 2015-11-10 DIAGNOSIS — Z90711 Acquired absence of uterus with remaining cervical stump: Secondary | ICD-10-CM | POA: Insufficient documentation

## 2015-11-10 DIAGNOSIS — K8689 Other specified diseases of pancreas: Secondary | ICD-10-CM | POA: Diagnosis not present

## 2015-11-14 ENCOUNTER — Other Ambulatory Visit: Payer: PRIVATE HEALTH INSURANCE

## 2015-11-15 ENCOUNTER — Telehealth: Payer: Self-pay | Admitting: Internal Medicine

## 2015-11-15 NOTE — Telephone Encounter (Signed)
-----   Message from Murrell Redden, MD sent at 11/15/2015 12:32 PM EST ----- Regarding: U/S Pedrohenrique [place]:        I have reviewed the ultrasound on Ms. Vessels. I was not overly concerned by the mild fullness of the right collecting system. She did have a CT scan from 2009. It does not appear that this was compared to the ultrasound. 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 It may extend slightly past the UPJ with fullness to a level of the crossing vessel. 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 Since there is no change from 2009 with no other evidence of significant obstruction, I do not feel that further evaluation is needed. Just monitor serum creatinine for change and urine for any evidence of blood. If there are any issues, I will be glad to evaluate.  Aaron Edelman

## 2015-11-15 NOTE — Telephone Encounter (Signed)
See lab note.  Pt notified.

## 2015-11-16 ENCOUNTER — Encounter: Payer: Self-pay | Admitting: *Deleted

## 2015-11-29 ENCOUNTER — Other Ambulatory Visit (INDEPENDENT_AMBULATORY_CARE_PROVIDER_SITE_OTHER): Payer: Medicare Other

## 2015-11-29 ENCOUNTER — Other Ambulatory Visit: Payer: Self-pay

## 2015-11-29 DIAGNOSIS — E119 Type 2 diabetes mellitus without complications: Secondary | ICD-10-CM

## 2015-11-29 DIAGNOSIS — E7800 Pure hypercholesterolemia, unspecified: Secondary | ICD-10-CM

## 2015-11-29 DIAGNOSIS — I10 Essential (primary) hypertension: Secondary | ICD-10-CM | POA: Diagnosis not present

## 2015-11-29 LAB — LIPID PANEL
CHOL/HDL RATIO: 4
Cholesterol: 216 mg/dL — ABNORMAL HIGH (ref 0–200)
HDL: 52.7 mg/dL (ref >39.00)
LDL CALC: 138 mg/dL — AB (ref 0–99)
NONHDL: 162.99
Triglycerides: 125 mg/dL (ref 0.0–149.0)
VLDL: 25 mg/dL (ref 0.0–40.0)

## 2015-11-29 LAB — BASIC METABOLIC PANEL
BUN: 13 mg/dL (ref 6–23)
CHLORIDE: 103 meq/L (ref 96–112)
CO2: 26 meq/L (ref 19–32)
Calcium: 9.7 mg/dL (ref 8.4–10.5)
Creatinine, Ser: 0.61 mg/dL (ref 0.40–1.20)
GFR: 103.14 mL/min (ref >60.00)
Glucose, Bld: 98 mg/dL (ref 70–99)
POTASSIUM: 4.2 meq/L (ref 3.5–5.1)
SODIUM: 139 meq/L (ref 135–145)

## 2015-11-29 LAB — HEPATIC FUNCTION PANEL
ALT: 12 U/L (ref 0–35)
AST: 12 U/L (ref 0–37)
Albumin: 4.3 g/dL (ref 3.5–5.2)
Alkaline Phosphatase: 71 U/L (ref 39–117)
BILIRUBIN TOTAL: 0.7 mg/dL (ref 0.2–1.2)
Bilirubin, Direct: 0.1 mg/dL (ref 0.0–0.3)
Total Protein: 6.7 g/dL (ref 6.0–8.3)

## 2015-11-29 LAB — HEMOGLOBIN A1C: HEMOGLOBIN A1C: 6.3 % (ref 4.6–6.5)

## 2015-11-29 MED ORDER — LOSARTAN POTASSIUM 100 MG PO TABS: ORAL_TABLET | ORAL | Status: DC

## 2015-11-29 MED ORDER — CITALOPRAM HYDROBROMIDE 40 MG PO TABS: 40.0000 mg | ORAL_TABLET | Freq: Every day | ORAL | Status: DC

## 2015-11-29 MED ORDER — GLUCOSE BLOOD VI STRP: ORAL_STRIP | Status: DC

## 2015-11-29 MED ORDER — ATORVASTATIN CALCIUM 40 MG PO TABS: 40.0000 mg | ORAL_TABLET | Freq: Every day | ORAL | Status: DC

## 2015-11-29 MED ORDER — METFORMIN HCL 500 MG PO TABS: ORAL_TABLET | ORAL | Status: DC

## 2015-11-29 MED ORDER — LATANOPROST 0.005 % OP SOLN: 1.0000 [drp] | Freq: Every day | OPHTHALMIC | Status: DC

## 2015-11-29 MED ORDER — GABAPENTIN 600 MG PO TABS: ORAL_TABLET | ORAL | Status: DC

## 2015-11-29 MED ORDER — PANTOPRAZOLE SODIUM 40 MG PO TBEC: DELAYED_RELEASE_TABLET | ORAL | Status: DC

## 2015-11-30 ENCOUNTER — Encounter: Payer: Self-pay | Admitting: *Deleted

## 2015-11-30 ENCOUNTER — Encounter: Payer: Self-pay | Admitting: Internal Medicine

## 2015-12-05 NOTE — Telephone Encounter (Signed)
Unread mychart message mailed to patient 

## 2015-12-22 DIAGNOSIS — H43811 Vitreous degeneration, right eye: Secondary | ICD-10-CM | POA: Diagnosis not present

## 2015-12-22 DIAGNOSIS — H3322 Serous retinal detachment, left eye: Secondary | ICD-10-CM | POA: Diagnosis not present

## 2015-12-22 DIAGNOSIS — E119 Type 2 diabetes mellitus without complications: Secondary | ICD-10-CM | POA: Diagnosis not present

## 2015-12-22 DIAGNOSIS — H35372 Puckering of macula, left eye: Secondary | ICD-10-CM | POA: Diagnosis not present

## 2015-12-27 ENCOUNTER — Other Ambulatory Visit: Payer: Self-pay | Admitting: Internal Medicine

## 2016-01-16 DIAGNOSIS — C50911 Malignant neoplasm of unspecified site of right female breast: Secondary | ICD-10-CM | POA: Diagnosis not present

## 2016-01-16 DIAGNOSIS — C50411 Malignant neoplasm of upper-outer quadrant of right female breast: Secondary | ICD-10-CM | POA: Diagnosis not present

## 2016-01-18 ENCOUNTER — Ambulatory Visit (INDEPENDENT_AMBULATORY_CARE_PROVIDER_SITE_OTHER): Payer: Medicare Other | Admitting: Internal Medicine

## 2016-01-18 ENCOUNTER — Encounter: Payer: Self-pay | Admitting: Internal Medicine

## 2016-01-18 VITALS — BP 110/70 | HR 74 | Temp 98.1°F | Resp 18 | Ht 63.5 in | Wt 159.0 lb

## 2016-01-18 DIAGNOSIS — Z658 Other specified problems related to psychosocial circumstances: Secondary | ICD-10-CM

## 2016-01-18 DIAGNOSIS — E7800 Pure hypercholesterolemia, unspecified: Secondary | ICD-10-CM

## 2016-01-18 DIAGNOSIS — F439 Reaction to severe stress, unspecified: Secondary | ICD-10-CM

## 2016-01-18 DIAGNOSIS — E119 Type 2 diabetes mellitus without complications: Secondary | ICD-10-CM | POA: Diagnosis not present

## 2016-01-18 DIAGNOSIS — R1031 Right lower quadrant pain: Secondary | ICD-10-CM

## 2016-01-18 DIAGNOSIS — I10 Essential (primary) hypertension: Secondary | ICD-10-CM

## 2016-01-18 DIAGNOSIS — K219 Gastro-esophageal reflux disease without esophagitis: Secondary | ICD-10-CM | POA: Diagnosis not present

## 2016-01-18 DIAGNOSIS — M549 Dorsalgia, unspecified: Secondary | ICD-10-CM

## 2016-01-18 DIAGNOSIS — G8929 Other chronic pain: Secondary | ICD-10-CM

## 2016-01-18 MED ORDER — CITALOPRAM HYDROBROMIDE 40 MG PO TABS: 40.0000 mg | ORAL_TABLET | Freq: Every day | ORAL | Status: DC

## 2016-01-18 NOTE — Progress Notes (Signed)
Pre-visit discussion using our clinic review tool. No additional management support is needed unless otherwise documented below in the visit note.  

## 2016-01-18 NOTE — Progress Notes (Signed)
Patient ID: Sherry Simon, female   DOB: July 18, 1946, 70 y.o.   MRN: [id number]   Subjective:    Patient ID: Sherry Simon, female    DOB: June 21, 1946, 70 y.o.   MRN: [id number]  HPI  Patient here for a scheduled follow up.  She states she is doing better.  Her husband is doing better.  Handling stress relatively well.  No chest pain or tightness.  No sob.  No abdominal pain or cramping.  Bowels stable.     Past Medical History  Diagnosis Date  . Hypertension   . Diabetes mellitus ([place])   . Hypercholesterolemia   . Endometriosis     requiring hysterectomy  . Nephrolithiasis   . Anxiety and depression   . Pericarditis     recurrent, unkown origin  . Tachycardia   . Depression   . History of chicken pox   . Colon polyps    Past Surgical History  Procedure Laterality Date  . Oophorectomy  1982  . Cholecystectomy      open  . Tonsillectomy    . Appendectomy  1055  . Back surgery  1005-2010    laminectomy  . Abdominal hysterectomy  1980   Family History  Problem Relation Age of Onset  . Heart disease Father     myocardial infarction - died 54  . Thyroid disease Mother   . Transient ischemic attack Mother     multiple  . Breast cancer Mother   . Hyperlipidemia Mother   . Kidney disease Mother   . Diabetes Mother   . Rheumatic fever Sister     mitral valve problems  . Breast cancer Paternal Aunt   . Colon cancer Neg Hx   . Other Sister     Small vessel disease   Social History   Social History  . Marital Status: Married    Spouse Name: N/A  . Number of Children: 3  . Years of Education: N/A   Social History Main Topics  . Smoking status: Never Smoker   . Smokeless tobacco: Never Used  . Alcohol Use: No  . Drug Use: No  . Sexual Activity: No   Other Topics Concern  . None   Social History Narrative    Outpatient Encounter Prescriptions as of 01/18/2016  Medication Sig  . aspirin 81 MG tablet Take 81 mg by mouth daily.  Marland Kitchen atorvastatin  (LIPITOR) 40 MG tablet Take 1 tablet (40 mg total) by mouth daily.  . Calcium Carbonate-Vitamin D 600-400 MG-UNIT tablet Take by mouth.  . citalopram (CELEXA) 40 MG tablet Take 1 tablet (40 mg total) by mouth daily.  Marland Kitchen gabapentin (NEURONTIN) 600 MG tablet TAKE 2 TABLETS IN THE  MORNING AND 3 TABLETS IN  THE EVENING (Patient taking differently: TAKE 1 TABLETS IN THE  MORNING AND 3 TABLETS IN  THE EVENING)  . glucose blood (ONE TOUCH ULTRA TEST) test strip TEST BLOOD SUGAR TWICE A DAY  . latanoprost (XALATAN) 0.005 % ophthalmic solution Place 1 drop into the left eye at bedtime. (Patient taking differently: Place 1 drop into both eyes at bedtime. )  . losartan (COZAAR) 100 MG tablet TAKE 1 TABLET (100 MG TOTAL) BY MOUTH DAILY.  . metFORMIN (GLUCOPHAGE) 500 MG tablet TAKE 2 TABLETS BY MOUTH EVERY MORNING AND TAKE 1 TABLET AT SUPPER  . oxyCODONE (OXY IR/ROXICODONE) 5 MG immediate release tablet May take up 3 tablets at a time every 6 hours  . pantoprazole (PROTONIX) 40 MG tablet  TAKE 1 TABLET (40 MG TOTAL) BY MOUTH 2 (TWO) TIMES DAILY.  . tamoxifen (NOLVADEX) 20 MG tablet Take by mouth.  . [DISCONTINUED] citalopram (CELEXA) 40 MG tablet Take 1 tablet (40 mg total) by mouth daily.  . [DISCONTINUED] citalopram (CELEXA) 40 MG tablet Take 1 tablet (40 mg total) by mouth daily.  . [DISCONTINUED] cefdinir (OMNICEF) 300 MG capsule Take 1 capsule (300 mg total) by mouth 2 (two) times daily.  . [DISCONTINUED] gabapentin (NEURONTIN) 300 MG capsule 300 mg. Take 3 capsules in the morning & 4 at bedtime   No facility-administered encounter medications on file as of 01/18/2016.    Review of Systems  Constitutional: Negative for appetite change and unexpected weight change.  HENT: Negative for congestion and sinus pressure.   Respiratory: Negative for cough, chest tightness and shortness of breath.   Cardiovascular: Negative for chest pain, palpitations and leg swelling.  Gastrointestinal: Negative for nausea,  vomiting, abdominal pain and diarrhea.  Genitourinary: Negative for dysuria and difficulty urinating.  Musculoskeletal: Positive for back pain. Negative for joint swelling.  Skin: Negative for color change and rash.  Neurological: Negative for dizziness, light-headedness and headaches.  Psychiatric/Behavioral: Negative for dysphoric mood and agitation.       Objective:    Physical Exam  Constitutional: She appears well-developed and well-nourished. No distress.  HENT:  Nose: Nose normal.  Mouth/Throat: Oropharynx is clear and moist.  Neck: Neck supple. No thyromegaly present.  Cardiovascular: Normal rate and regular rhythm.   Pulmonary/Chest: Breath sounds normal. No respiratory distress. She has no wheezes.  Abdominal: Soft. Bowel sounds are normal. There is no tenderness.  Musculoskeletal: She exhibits no edema or tenderness.  Lymphadenopathy:    She has no cervical adenopathy.  Skin: No rash noted. No erythema.  Psychiatric: She has a normal mood and affect. Her behavior is normal.    BP 110/70 mmHg  Pulse 74  Temp(Src) 98.1 F (36.7 C) (Oral)  Resp 18  Ht 5' 3.5" (1.613 m)  Wt 159 lb (72.122 kg)  BMI 27.72 kg/m2  SpO2 96% Wt Readings from Last 3 Encounters:  01/18/16 159 lb (72.122 kg)  11/03/15 161 lb 2 oz (73.086 kg)  10/11/15 159 lb 6.4 oz (72.303 kg)     Lab Results  Component Value Date   WBC 4.6 05/23/2015   HGB 12.3 05/23/2015   HCT 36.8 05/23/2015   PLT 352.0 05/23/2015   GLUCOSE 98 11/29/2015   CHOL 216* 11/29/2015   TRIG 125.0 11/29/2015   HDL 52.70 11/29/2015   LDLCALC 138* 11/29/2015   ALT 12 11/29/2015   AST 12 11/29/2015   NA 139 11/29/2015   K 4.2 11/29/2015   CL 103 11/29/2015   CREATININE 0.61 11/29/2015   BUN 13 11/29/2015   CO2 26 11/29/2015   TSH 1.07 05/23/2015   HGBA1C 6.3 11/29/2015   MICROALBUR 0.5 08/13/2014    US Abdomen Complete  11/10/2015  CLINICAL DATA:  Right-sided pain extending to the right lower quadrant with  2-3 months occurring intermittently EXAM: ABDOMEN ULTRASOUND COMPLETE COMPARISON:  CT abdomen and pelvis of 04/13/2008 FINDINGS: Gallbladder: The gallbladder has previously been resected. Common bile duct: Diameter: The common bile duct is normal measuring 8 mm in diameter distally. Portions of the common bile duct are obscured by bowel gas. Liver: The liver is echogenic consistent with diffuse fatty infiltration as noted previously. No focal hepatic abnormality is seen. IVC: No abnormality visualized. Pancreas: The pancreas is completely obscured by overlying bowel gas and  cannot be evaluated. Spleen: The spleen is normal measuring 6.6 cm. Right Kidney: Length: 13.0 cm. The ultrasound technologist questioned mild fullness of the right pelvocaliceal system of uncertain significance. Left Kidney: Length: 12.6 cm.  No hydronephrosis is seen. Abdominal aorta: The abdominal aorta is partially obscured by bowel gas but no aneurysmal dilatation is seen. Other findings: The study is somewhat compromised by large amount of overlying bowel gas. IMPRESSION: 1. Echogenic liver parenchyma consistent with diffuse fatty infiltration. No focal hepatic abnormality. 2. The pancreas is largely obscured by bowel gas. 3. Questionable mild fullness of the right pelvocaliceal system. Electronically Signed   By: Ivar Drape M.D.   On: 11/10/2015 10:43   US Transvaginal Non-ob  11/10/2015  CLINICAL DATA:  2-3 month history of intermittent right lower quadrant pain; history of partial hysterectomy, appendectomy, and cholecystectomy. The patient is postmenopausal EXAM: TRANSABDOMINAL AND TRANSVAGINAL ULTRASOUND OF PELVIS TECHNIQUE: Both transabdominal and transvaginal ultrasound examinations of the pelvis were performed. Transabdominal technique was performed for global imaging of the pelvis including uterus, ovaries, adnexal regions, and pelvic cul-de-sac. It was necessary to proceed with endovaginal exam following the transabdominal  exam to visualize the ovaries and adnexal structures. COMPARISON:  Abdominal and pelvic CT scan dated April 13, 2008 FINDINGS: Uterus The uterus is surgically absent. Right ovary The right ovary could not be visualized. Left ovary The left ovary is reportedly surgically absent. No adnexal masses are observed. Other findings No abnormal free fluid. IMPRESSION: 1. The uterus and left ovary are surgically absent. 2. Nonvisualization of the right ovary. No adnexal masses are observed. 3. Further evaluation with abdominal and pelvic CT scanning would be useful if the patient's symptoms persist. Electronically Signed   By: David  Martinique M.D.   On: 11/10/2015 10:38   US Pelvis Complete  11/10/2015  CLINICAL DATA:  2-3 month history of intermittent right lower quadrant pain; history of partial hysterectomy, appendectomy, and cholecystectomy. The patient is postmenopausal EXAM: TRANSABDOMINAL AND TRANSVAGINAL ULTRASOUND OF PELVIS TECHNIQUE: Both transabdominal and transvaginal ultrasound examinations of the pelvis were performed. Transabdominal technique was performed for global imaging of the pelvis including uterus, ovaries, adnexal regions, and pelvic cul-de-sac. It was necessary to proceed with endovaginal exam following the transabdominal exam to visualize the ovaries and adnexal structures. COMPARISON:  Abdominal and pelvic CT scan dated April 13, 2008 FINDINGS: Uterus The uterus is surgically absent. Right ovary The right ovary could not be visualized. Left ovary The left ovary is reportedly surgically absent. No adnexal masses are observed. Other findings No abnormal free fluid. IMPRESSION: 1. The uterus and left ovary are surgically absent. 2. Nonvisualization of the right ovary. No adnexal masses are observed. 3. Further evaluation with abdominal and pelvic CT scanning would be useful if the patient's symptoms persist. Electronically Signed   By: David  Martinique M.D.   On: 11/10/2015 10:38       Assessment &  Plan:   Problem List Items Addressed This Visit    Abdominal pain    Discomfort is better.  Had abdominal ultrasound and pelvic ultrasound as outlined.  Desires no further w/up.  Follow.  No significant pain now.       Chronic back pain    S/p back surgery.  Still with pain.  Follow.  Sees Dr Sharlet Salina.       Diabetes mellitus ([place])    Low carb diet and exercise.  Follow met b and a1c.       Relevant Orders   Hemoglobin A1c  Microalbumin / creatinine urine ratio   GERD (gastroesophageal reflux disease)    On protonix.  Symptoms controlled.       Hypercholesterolemia    On lipitor.  Low cholesterol diet and exercise.  Follow lipid panel and liver function tests.       Relevant Orders   Lipid panel   Hepatic function panel   Hypertension - Primary    Blood pressure under good control.  Continue same medication regimen.  Follow pressures.  Follow metabolic panel.        Relevant Orders   CBC with Differential/Platelet   TSH   Basic metabolic panel   Stress    On citalopram   Doing better.  Stress is some better.  Follow.            Einar Pheasant, MD

## 2016-01-19 ENCOUNTER — Other Ambulatory Visit: Payer: Self-pay | Admitting: Internal Medicine

## 2016-01-30 ENCOUNTER — Encounter: Payer: Self-pay | Admitting: Internal Medicine

## 2016-01-30 NOTE — Assessment & Plan Note (Signed)
S/p back surgery.  Still with pain.  Follow.  Sees Dr Sharlet Salina.

## 2016-01-30 NOTE — Assessment & Plan Note (Signed)
Low carb diet and exercise.  Follow met b and a1c.

## 2016-01-30 NOTE — Assessment & Plan Note (Signed)
Blood pressure under good control.  Continue same medication regimen.  Follow pressures.  Follow metabolic panel.   

## 2016-01-30 NOTE — Assessment & Plan Note (Signed)
On citalopram   Doing better.  Stress is some better.  Follow.

## 2016-01-30 NOTE — Assessment & Plan Note (Signed)
On protonix.  Symptoms controlled.   

## 2016-01-30 NOTE — Assessment & Plan Note (Signed)
On lipitor.  Low cholesterol diet and exercise.  Follow lipid panel and liver function tests.   

## 2016-01-30 NOTE — Assessment & Plan Note (Signed)
Discomfort is better.  Had abdominal ultrasound and pelvic ultrasound as outlined.  Desires no further w/up.  Follow.  No significant pain now.

## 2016-02-02 ENCOUNTER — Other Ambulatory Visit: Payer: Self-pay | Admitting: Internal Medicine

## 2016-02-06 DIAGNOSIS — C50911 Malignant neoplasm of unspecified site of right female breast: Secondary | ICD-10-CM | POA: Diagnosis not present

## 2016-02-06 DIAGNOSIS — M810 Age-related osteoporosis without current pathological fracture: Secondary | ICD-10-CM | POA: Diagnosis not present

## 2016-02-07 DIAGNOSIS — L570 Actinic keratosis: Secondary | ICD-10-CM | POA: Diagnosis not present

## 2016-02-17 ENCOUNTER — Other Ambulatory Visit: Payer: Self-pay | Admitting: Internal Medicine

## 2016-02-28 DIAGNOSIS — N63 Unspecified lump in breast: Secondary | ICD-10-CM | POA: Diagnosis not present

## 2016-02-28 DIAGNOSIS — C50911 Malignant neoplasm of unspecified site of right female breast: Secondary | ICD-10-CM | POA: Diagnosis not present

## 2016-02-28 DIAGNOSIS — M810 Age-related osteoporosis without current pathological fracture: Secondary | ICD-10-CM | POA: Diagnosis not present

## 2016-02-28 LAB — HM DEXA SCAN: HM Dexa Scan: NORMAL

## 2016-03-08 DIAGNOSIS — M5416 Radiculopathy, lumbar region: Secondary | ICD-10-CM | POA: Diagnosis not present

## 2016-03-08 DIAGNOSIS — M5417 Radiculopathy, lumbosacral region: Secondary | ICD-10-CM | POA: Diagnosis not present

## 2016-03-08 DIAGNOSIS — M5126 Other intervertebral disc displacement, lumbar region: Secondary | ICD-10-CM | POA: Diagnosis not present

## 2016-03-08 DIAGNOSIS — M5136 Other intervertebral disc degeneration, lumbar region: Secondary | ICD-10-CM | POA: Diagnosis not present

## 2016-03-08 DIAGNOSIS — M4806 Spinal stenosis, lumbar region: Secondary | ICD-10-CM | POA: Diagnosis not present

## 2016-03-22 DIAGNOSIS — H401131 Primary open-angle glaucoma, bilateral, mild stage: Secondary | ICD-10-CM | POA: Diagnosis not present

## 2016-03-22 DIAGNOSIS — Z961 Presence of intraocular lens: Secondary | ICD-10-CM | POA: Diagnosis not present

## 2016-03-22 DIAGNOSIS — H35372 Puckering of macula, left eye: Secondary | ICD-10-CM | POA: Diagnosis not present

## 2016-03-22 DIAGNOSIS — H3322 Serous retinal detachment, left eye: Secondary | ICD-10-CM | POA: Diagnosis not present

## 2016-04-02 ENCOUNTER — Telehealth: Payer: Self-pay | Admitting: *Deleted

## 2016-04-02 NOTE — Telephone Encounter (Signed)
I can see her tomorrow at 12:30.  Have her come in at 12:15 to get in a room.  Work in for this problem.  Thanks

## 2016-04-02 NOTE — Telephone Encounter (Signed)
Spoke with the patient, collar bone is protruding out on the right side, noticed it last week. No pain, or discomfort noted.  Breast cancer in the past, afraid that there may be a lymph node causing it to swell and push out.  Please advise a appt date and time to schedule to see her. thanks

## 2016-04-02 NOTE — Telephone Encounter (Signed)
Spoke with patient, she will be here at [facility] tomorrow. thanks

## 2016-04-02 NOTE — Telephone Encounter (Signed)
Patient would like to see Dr.Scott this week ,she stated her collar bone on the right side is protruding out with no pain.  Pt contact [phone]

## 2016-04-03 ENCOUNTER — Ambulatory Visit (INDEPENDENT_AMBULATORY_CARE_PROVIDER_SITE_OTHER): Payer: Medicare Other | Admitting: Internal Medicine

## 2016-04-03 ENCOUNTER — Encounter: Payer: Self-pay | Admitting: Internal Medicine

## 2016-04-03 ENCOUNTER — Ambulatory Visit (INDEPENDENT_AMBULATORY_CARE_PROVIDER_SITE_OTHER): Payer: Medicare Other

## 2016-04-03 VITALS — BP 122/70 | HR 61 | Temp 98.5°F | Resp 18 | Ht 63.5 in | Wt 159.4 lb

## 2016-04-03 DIAGNOSIS — M75100 Unspecified rotator cuff tear or rupture of unspecified shoulder, not specified as traumatic: Secondary | ICD-10-CM

## 2016-04-03 DIAGNOSIS — C50919 Malignant neoplasm of unspecified site of unspecified female breast: Secondary | ICD-10-CM | POA: Diagnosis not present

## 2016-04-03 DIAGNOSIS — M898X8 Other specified disorders of bone, other site: Secondary | ICD-10-CM | POA: Diagnosis not present

## 2016-04-03 DIAGNOSIS — Z658 Other specified problems related to psychosocial circumstances: Secondary | ICD-10-CM | POA: Diagnosis not present

## 2016-04-03 DIAGNOSIS — R918 Other nonspecific abnormal finding of lung field: Secondary | ICD-10-CM | POA: Diagnosis not present

## 2016-04-03 DIAGNOSIS — N644 Mastodynia: Secondary | ICD-10-CM

## 2016-04-03 DIAGNOSIS — E119 Type 2 diabetes mellitus without complications: Secondary | ICD-10-CM

## 2016-04-03 DIAGNOSIS — M89319 Hypertrophy of bone, unspecified shoulder: Secondary | ICD-10-CM

## 2016-04-03 DIAGNOSIS — R1031 Right lower quadrant pain: Secondary | ICD-10-CM | POA: Diagnosis not present

## 2016-04-03 DIAGNOSIS — M19011 Primary osteoarthritis, right shoulder: Secondary | ICD-10-CM | POA: Diagnosis not present

## 2016-04-03 DIAGNOSIS — F439 Reaction to severe stress, unspecified: Secondary | ICD-10-CM

## 2016-04-03 NOTE — Progress Notes (Signed)
Patient ID: Sherry Simon, female   DOB: [birth year], 70 y.o.   MRN: [id number]   Subjective:    Patient ID: Sherry Simon, female    DOB: 28-Jun-1946, 70 y.o.   MRN: [id number]  HPI  Patient here as a work in with concerns regarding increased fullness of right clavicle.  She reports  Noticing and increased prominence recently.  No pain.  No injury (recent or remote).  She does report tenderness right lateral side/breast.  Notices when lies on her right side.  No nipple discharge.  No chest pain.  No sob.  No nausea or vomiting.  Bowels stable.  No increased cough or congestion.  She is accompanied by her daughter.  History obtained from both of them.    Past Medical History  Diagnosis Date  . Hypertension   . Diabetes mellitus ([place])   . Hypercholesterolemia   . Endometriosis     requiring hysterectomy  . Nephrolithiasis   . Anxiety and depression   . Pericarditis     recurrent, unkown origin  . Tachycardia   . Depression   . History of chicken pox   . Colon polyps    Past Surgical History  Procedure Laterality Date  . Oophorectomy  1982  . Cholecystectomy      open  . Tonsillectomy    . Appendectomy  1055  . Back surgery  1005-2010    laminectomy  . Abdominal hysterectomy  1980   Family History  Problem Relation Age of Onset  . Heart disease Father     myocardial infarction - died 9  . Thyroid disease Mother   . Transient ischemic attack Mother     multiple  . Breast cancer Mother   . Hyperlipidemia Mother   . Kidney disease Mother   . Diabetes Mother   . Rheumatic fever Sister     mitral valve problems  . Breast cancer Paternal Aunt   . Colon cancer Neg Hx   . Other Sister     Small vessel disease   Social History   Social History  . Marital Status: Married    Spouse Name: N/A  . Number of Children: 3  . Years of Education: N/A   Social History Main Topics  . Smoking status: Never Smoker   . Smokeless tobacco: Never Used  . Alcohol Use: No    . Drug Use: No  . Sexual Activity: No   Other Topics Concern  . None   Social History Narrative    Outpatient Encounter Prescriptions as of 04/03/2016  Medication Sig  . aspirin 81 MG tablet Take 81 mg by mouth daily.  . Calcium Carbonate-Vitamin D 600-400 MG-UNIT tablet Take by mouth.  . citalopram (CELEXA) 40 MG tablet Take 1 tablet (40 mg total) by mouth daily.  Marland Kitchen glucose blood (ONE TOUCH ULTRA TEST) test strip TEST BLOOD SUGAR TWICE A DAY  . latanoprost (XALATAN) 0.005 % ophthalmic solution Instill 1 drop into the  left eye at bedtime  . losartan (COZAAR) 100 MG tablet TAKE 1 TABLET (100 MG TOTAL) BY MOUTH DAILY.  Marland Kitchen oxyCODONE (OXY IR/ROXICODONE) 5 MG immediate release tablet May take up 3 tablets at a time every 6 hours  . pantoprazole (PROTONIX) 40 MG tablet TAKE 1 TABLET (40 MG TOTAL) BY MOUTH 2 (TWO) TIMES DAILY.  . tamoxifen (NOLVADEX) 20 MG tablet Take by mouth.  . [DISCONTINUED] atorvastatin (LIPITOR) 40 MG tablet Take 1 tablet (40 mg total) by mouth daily.  . [  DISCONTINUED] gabapentin (NEURONTIN) 600 MG tablet TAKE 2 TABLETS IN THE  MORNING AND 3 TABLETS IN  THE EVENING (Patient taking differently: TAKE 1 TABLETS IN THE  MORNING AND 3 TABLETS IN  THE EVENING)  . [DISCONTINUED] metFORMIN (GLUCOPHAGE) 500 MG tablet TAKE 2 TABLETS BY MOUTH EVERY MORNING AND TAKE 1 TABLET AT SUPPER  . [DISCONTINUED] citalopram (CELEXA) 40 MG tablet TAKE 1 TABLET (40 MG TOTAL) BY MOUTH DAILY.   No facility-administered encounter medications on file as of 04/03/2016.    Review of Systems  Constitutional: Negative for appetite change and unexpected weight change.  HENT: Negative for congestion and sinus pressure.   Respiratory: Negative for cough, chest tightness and shortness of breath.   Cardiovascular: Negative for chest pain and palpitations.  Gastrointestinal: Negative for nausea, vomiting, abdominal pain and diarrhea.  Musculoskeletal: Negative for myalgias and joint swelling.  Skin:  Negative for color change and rash.  Psychiatric/Behavioral: Negative for dysphoric mood and agitation.       Objective:    Physical Exam  Constitutional: She appears well-developed and well-nourished. No distress.  Neck: Neck supple.  Cardiovascular: Normal rate and regular rhythm.   Pulmonary/Chest: Breath sounds normal. No respiratory distress. She has no wheezes.  Breasts:  No nipple discharge or nipple retraction present.  Increased fullness and tenderness - right lateral side/breast - 9:00.  No other palpable nodules or axillary adenopathy.    Abdominal: Soft. Bowel sounds are normal. There is no tenderness.  Musculoskeletal: She exhibits no edema or tenderness.  Lymphadenopathy:    She has no cervical adenopathy.  Skin: No rash noted. No erythema.  Psychiatric: She has a normal mood and affect. Her behavior is normal.    BP 122/70 mmHg  Pulse 61  Temp(Src) 98.5 F (36.9 C) (Oral)  Resp 18  Ht 5' 3.5" (1.613 m)  Wt 159 lb 6 oz (72.292 kg)  BMI 27.79 kg/m2  SpO2 96% Wt Readings from Last 3 Encounters:  04/03/16 159 lb 6 oz (72.292 kg)  01/18/16 159 lb (72.122 kg)  11/03/15 161 lb 2 oz (73.086 kg)     Lab Results  Component Value Date   WBC 4.6 05/23/2015   HGB 12.3 05/23/2015   HCT 36.8 05/23/2015   PLT 352.0 05/23/2015   GLUCOSE 98 11/29/2015   CHOL 216* 11/29/2015   TRIG 125.0 11/29/2015   HDL 52.70 11/29/2015   LDLCALC 138* 11/29/2015   ALT 12 11/29/2015   AST 12 11/29/2015   NA 139 11/29/2015   K 4.2 11/29/2015   CL 103 11/29/2015   CREATININE 0.61 11/29/2015   BUN 13 11/29/2015   CO2 26 11/29/2015   TSH 1.07 05/23/2015   HGBA1C 6.3 11/29/2015   MICROALBUR 0.5 08/13/2014    US Abdomen Complete  11/10/2015  CLINICAL DATA:  Right-sided pain extending to the right lower quadrant with 2-3 months occurring intermittently EXAM: ABDOMEN ULTRASOUND COMPLETE COMPARISON:  CT abdomen and pelvis of 04/13/2008 FINDINGS: Gallbladder: The gallbladder has  previously been resected. Common bile duct: Diameter: The common bile duct is normal measuring 8 mm in diameter distally. Portions of the common bile duct are obscured by bowel gas. Liver: The liver is echogenic consistent with diffuse fatty infiltration as noted previously. No focal hepatic abnormality is seen. IVC: No abnormality visualized. Pancreas: The pancreas is completely obscured by overlying bowel gas and cannot be evaluated. Spleen: The spleen is normal measuring 6.6 cm. Right Kidney: Length: 13.0 cm. The ultrasound technologist questioned mild fullness of the  right pelvocaliceal system of uncertain significance. Left Kidney: Length: 12.6 cm.  No hydronephrosis is seen. Abdominal aorta: The abdominal aorta is partially obscured by bowel gas but no aneurysmal dilatation is seen. Other findings: The study is somewhat compromised by large amount of overlying bowel gas. IMPRESSION: 1. Echogenic liver parenchyma consistent with diffuse fatty infiltration. No focal hepatic abnormality. 2. The pancreas is largely obscured by bowel gas. 3. Questionable mild fullness of the right pelvocaliceal system. Electronically Signed   By: Ivar Drape M.D.   On: 11/10/2015 10:43   US Transvaginal Non-ob  11/10/2015  CLINICAL DATA:  2-3 month history of intermittent right lower quadrant pain; history of partial hysterectomy, appendectomy, and cholecystectomy. The patient is postmenopausal EXAM: TRANSABDOMINAL AND TRANSVAGINAL ULTRASOUND OF PELVIS TECHNIQUE: Both transabdominal and transvaginal ultrasound examinations of the pelvis were performed. Transabdominal technique was performed for global imaging of the pelvis including uterus, ovaries, adnexal regions, and pelvic cul-de-sac. It was necessary to proceed with endovaginal exam following the transabdominal exam to visualize the ovaries and adnexal structures. COMPARISON:  Abdominal and pelvic CT scan dated April 13, 2008 FINDINGS: Uterus The uterus is surgically absent.  Right ovary The right ovary could not be visualized. Left ovary The left ovary is reportedly surgically absent. No adnexal masses are observed. Other findings No abnormal free fluid. IMPRESSION: 1. The uterus and left ovary are surgically absent. 2. Nonvisualization of the right ovary. No adnexal masses are observed. 3. Further evaluation with abdominal and pelvic CT scanning would be useful if the patient's symptoms persist. Electronically Signed   By: David  Martinique M.D.   On: 11/10/2015 10:38   US Pelvis Complete  11/10/2015  CLINICAL DATA:  2-3 month history of intermittent right lower quadrant pain; history of partial hysterectomy, appendectomy, and cholecystectomy. The patient is postmenopausal EXAM: TRANSABDOMINAL AND TRANSVAGINAL ULTRASOUND OF PELVIS TECHNIQUE: Both transabdominal and transvaginal ultrasound examinations of the pelvis were performed. Transabdominal technique was performed for global imaging of the pelvis including uterus, ovaries, adnexal regions, and pelvic cul-de-sac. It was necessary to proceed with endovaginal exam following the transabdominal exam to visualize the ovaries and adnexal structures. COMPARISON:  Abdominal and pelvic CT scan dated April 13, 2008 FINDINGS: Uterus The uterus is surgically absent. Right ovary The right ovary could not be visualized. Left ovary The left ovary is reportedly surgically absent. No adnexal masses are observed. Other findings No abnormal free fluid. IMPRESSION: 1. The uterus and left ovary are surgically absent. 2. Nonvisualization of the right ovary. No adnexal masses are observed. 3. Further evaluation with abdominal and pelvic CT scanning would be useful if the patient's symptoms persist. Electronically Signed   By: David  Martinique M.D.   On: 11/10/2015 10:38       Assessment & Plan:   Problem List Items Addressed This Visit    Abdominal pain    CT as outlined. No pain reported today.  Follow.       Breast cancer [facility])    Has been  followed by oncology.  With the fullness and discomfort as outlined - right lateral breast.  Check xray.  Will check right breast diagnostic mammogram with ultrasound.  F/u with oncology.        Relevant Orders   DG Chest 2 View (Completed)   MM Digital Diagnostic Unilat R   US BREAST LTD UNI RIGHT INC AXILLA   Ambulatory referral to Oncology   Clavicle enlargement - Primary    No pain.  Prominence of the proximal  clavicle.  Will xray.  No known injury or trauma.        Relevant Orders   DG Clavicle Right (Completed)   Diabetes mellitus ([place])    Sugars have been well controlled.  Follow.        Rotator cuff tear    Previous shoulder pain and found to have rotator cuff tear.  Question if contributing to the increased prominence.  Check xray as outlined.        Stress    On citalopram.  Has been better.  Some increased stress with the pain.  Check xray as outlined.         Other Visit Diagnoses    Breast tenderness in female        Relevant Orders    MM Digital Diagnostic Unilat R    US BREAST LTD UNI RIGHT INC AXILLA    Ambulatory referral to Oncology        Einar Pheasant, MD

## 2016-04-03 NOTE — Progress Notes (Signed)
Pre-visit discussion using our clinic review tool. No additional management support is needed unless otherwise documented below in the visit note.  

## 2016-04-04 ENCOUNTER — Telehealth: Payer: Self-pay | Admitting: *Deleted

## 2016-04-04 NOTE — Telephone Encounter (Signed)
Called pt and notified of results.  See lab result notes and office note for further w/up.

## 2016-04-04 NOTE — Telephone Encounter (Signed)
Please advise for xray results from yesterday, thanks

## 2016-04-04 NOTE — Telephone Encounter (Signed)
Patient requested Xray results from 04/04/16 Pt. Contact [phone]

## 2016-04-06 ENCOUNTER — Ambulatory Visit: Payer: Medicare Other | Admitting: Internal Medicine

## 2016-04-06 ENCOUNTER — Other Ambulatory Visit: Payer: Medicare Other

## 2016-04-06 ENCOUNTER — Other Ambulatory Visit: Payer: Self-pay | Admitting: Internal Medicine

## 2016-04-09 ENCOUNTER — Encounter: Payer: Self-pay | Admitting: Internal Medicine

## 2016-04-09 NOTE — Assessment & Plan Note (Signed)
Has been followed by oncology.  With the fullness and discomfort as outlined - right lateral breast.  Check xray.  Will check right breast diagnostic mammogram with ultrasound.  F/u with oncology.

## 2016-04-09 NOTE — Assessment & Plan Note (Signed)
No pain.  Prominence of the proximal clavicle.  Will xray.  No known injury or trauma.

## 2016-04-09 NOTE — Assessment & Plan Note (Signed)
On citalopram.  Has been better.  Some increased stress with the pain.  Check xray as outlined.

## 2016-04-09 NOTE — Assessment & Plan Note (Signed)
CT as outlined. No pain reported today.  Follow.

## 2016-04-09 NOTE — Assessment & Plan Note (Signed)
Previous shoulder pain and found to have rotator cuff tear.  Question if contributing to the increased prominence.  Check xray as outlined.

## 2016-04-09 NOTE — Assessment & Plan Note (Signed)
Sugars have been well controlled.  Follow.

## 2016-04-23 ENCOUNTER — Ambulatory Visit: Payer: Medicare Other | Admitting: Internal Medicine

## 2016-04-24 DIAGNOSIS — R918 Other nonspecific abnormal finding of lung field: Secondary | ICD-10-CM | POA: Diagnosis not present

## 2016-04-24 DIAGNOSIS — M810 Age-related osteoporosis without current pathological fracture: Secondary | ICD-10-CM | POA: Diagnosis not present

## 2016-04-24 DIAGNOSIS — C50911 Malignant neoplasm of unspecified site of right female breast: Secondary | ICD-10-CM | POA: Diagnosis not present

## 2016-04-24 DIAGNOSIS — N63 Unspecified lump in breast: Secondary | ICD-10-CM | POA: Diagnosis not present

## 2016-04-24 DIAGNOSIS — Z170 Estrogen receptor positive status [ER+]: Secondary | ICD-10-CM | POA: Diagnosis not present

## 2016-05-11 ENCOUNTER — Telehealth: Payer: Self-pay | Admitting: Internal Medicine

## 2016-05-11 NOTE — Telephone Encounter (Signed)
Latoya from [facility] radiology called and said that they have tried reaching out to your patient multiple times and left multiple vm's for her to return their call in order to schedule her breast ultrasounds. She has not returned their call as of yet so she has not been scheduled.

## 2016-05-14 NOTE — Telephone Encounter (Signed)
Sent my chart message for update.  She saw her oncologist.  Per note review.  Had ultrasound and CT scan of chest.

## 2016-05-15 ENCOUNTER — Other Ambulatory Visit: Payer: Self-pay | Admitting: Internal Medicine

## 2016-05-17 NOTE — Telephone Encounter (Signed)
Unread mychart message mailed to patient 

## 2016-05-24 DIAGNOSIS — H43811 Vitreous degeneration, right eye: Secondary | ICD-10-CM | POA: Diagnosis not present

## 2016-05-24 DIAGNOSIS — Z961 Presence of intraocular lens: Secondary | ICD-10-CM | POA: Diagnosis not present

## 2016-05-24 DIAGNOSIS — E119 Type 2 diabetes mellitus without complications: Secondary | ICD-10-CM | POA: Diagnosis not present

## 2016-05-24 DIAGNOSIS — T380X1A Poisoning by glucocorticoids and synthetic analogues, accidental (unintentional), initial encounter: Secondary | ICD-10-CM | POA: Diagnosis not present

## 2016-05-24 DIAGNOSIS — H4062X0 Glaucoma secondary to drugs, left eye, stage unspecified: Secondary | ICD-10-CM | POA: Diagnosis not present

## 2016-06-04 DIAGNOSIS — M5417 Radiculopathy, lumbosacral region: Secondary | ICD-10-CM | POA: Diagnosis not present

## 2016-06-04 DIAGNOSIS — M5416 Radiculopathy, lumbar region: Secondary | ICD-10-CM | POA: Diagnosis not present

## 2016-06-04 DIAGNOSIS — M5136 Other intervertebral disc degeneration, lumbar region: Secondary | ICD-10-CM | POA: Diagnosis not present

## 2016-07-27 ENCOUNTER — Other Ambulatory Visit: Payer: Self-pay | Admitting: Internal Medicine

## 2016-07-27 ENCOUNTER — Other Ambulatory Visit: Payer: Self-pay

## 2016-07-27 MED ORDER — GABAPENTIN 600 MG PO TABS: ORAL_TABLET | ORAL | 1 refills | Status: DC

## 2016-07-27 MED ORDER — ATORVASTATIN CALCIUM 40 MG PO TABS: 40.0000 mg | ORAL_TABLET | Freq: Every day | ORAL | 1 refills | Status: DC

## 2016-07-27 MED ORDER — METFORMIN HCL 500 MG PO TABS: ORAL_TABLET | ORAL | 1 refills | Status: DC

## 2016-07-30 ENCOUNTER — Other Ambulatory Visit: Payer: Self-pay

## 2016-07-30 MED ORDER — CITALOPRAM HYDROBROMIDE 40 MG PO TABS: 40.0000 mg | ORAL_TABLET | Freq: Every day | ORAL | 0 refills | Status: DC

## 2016-07-30 MED ORDER — PANTOPRAZOLE SODIUM 40 MG PO TBEC: 40.0000 mg | DELAYED_RELEASE_TABLET | Freq: Two times a day (BID) | ORAL | 0 refills | Status: DC

## 2016-07-30 MED ORDER — LOSARTAN POTASSIUM 100 MG PO TABS: 100.0000 mg | ORAL_TABLET | Freq: Every day | ORAL | 0 refills | Status: DC

## 2016-08-09 DIAGNOSIS — M549 Dorsalgia, unspecified: Secondary | ICD-10-CM | POA: Diagnosis not present

## 2016-08-09 DIAGNOSIS — R232 Flushing: Secondary | ICD-10-CM | POA: Diagnosis not present

## 2016-08-09 DIAGNOSIS — C50911 Malignant neoplasm of unspecified site of right female breast: Secondary | ICD-10-CM | POA: Diagnosis not present

## 2016-08-09 DIAGNOSIS — Z79899 Other long term (current) drug therapy: Secondary | ICD-10-CM | POA: Diagnosis not present

## 2016-08-09 DIAGNOSIS — Z170 Estrogen receptor positive status [ER+]: Secondary | ICD-10-CM | POA: Diagnosis not present

## 2016-08-09 DIAGNOSIS — M810 Age-related osteoporosis without current pathological fracture: Secondary | ICD-10-CM | POA: Diagnosis not present

## 2016-08-20 DIAGNOSIS — C50919 Malignant neoplasm of unspecified site of unspecified female breast: Secondary | ICD-10-CM | POA: Diagnosis not present

## 2016-08-20 DIAGNOSIS — R0609 Other forms of dyspnea: Secondary | ICD-10-CM | POA: Diagnosis not present

## 2016-10-11 ENCOUNTER — Ambulatory Visit: Payer: Medicare Other

## 2016-10-17 DIAGNOSIS — M5136 Other intervertebral disc degeneration, lumbar region: Secondary | ICD-10-CM | POA: Diagnosis not present

## 2016-10-17 DIAGNOSIS — M5416 Radiculopathy, lumbar region: Secondary | ICD-10-CM | POA: Diagnosis not present

## 2016-10-22 ENCOUNTER — Telehealth: Payer: Self-pay | Admitting: Internal Medicine

## 2016-10-22 DIAGNOSIS — E7800 Pure hypercholesterolemia, unspecified: Secondary | ICD-10-CM

## 2016-10-22 DIAGNOSIS — K219 Gastro-esophageal reflux disease without esophagitis: Secondary | ICD-10-CM

## 2016-10-22 DIAGNOSIS — I10 Essential (primary) hypertension: Secondary | ICD-10-CM

## 2016-10-22 MED ORDER — PANTOPRAZOLE SODIUM 40 MG PO TBEC: 40.0000 mg | DELAYED_RELEASE_TABLET | Freq: Two times a day (BID) | ORAL | 1 refills | Status: DC

## 2016-10-22 MED ORDER — CITALOPRAM HYDROBROMIDE 40 MG PO TABS: 40.0000 mg | ORAL_TABLET | Freq: Every day | ORAL | 1 refills | Status: DC

## 2016-10-22 MED ORDER — LOSARTAN POTASSIUM 100 MG PO TABS: 100.0000 mg | ORAL_TABLET | Freq: Every day | ORAL | 1 refills | Status: DC

## 2016-10-22 NOTE — Telephone Encounter (Signed)
losartan (COZAAR) 100 MG tablet take one tablet daily qty 90  pantoprazole (PROTONIX) 40 MG tablet take one tablet by mouth two times daily. Qty 180  citalopram (CELEXA) 40 MG tablet take one tablet by mouth daily qty 90

## 2016-10-24 DIAGNOSIS — H4062X0 Glaucoma secondary to drugs, left eye, stage unspecified: Secondary | ICD-10-CM | POA: Diagnosis not present

## 2016-10-24 DIAGNOSIS — H35372 Puckering of macula, left eye: Secondary | ICD-10-CM | POA: Diagnosis not present

## 2016-10-24 DIAGNOSIS — Z961 Presence of intraocular lens: Secondary | ICD-10-CM | POA: Diagnosis not present

## 2016-10-24 DIAGNOSIS — E119 Type 2 diabetes mellitus without complications: Secondary | ICD-10-CM | POA: Diagnosis not present

## 2016-10-24 DIAGNOSIS — H3322 Serous retinal detachment, left eye: Secondary | ICD-10-CM | POA: Diagnosis not present

## 2016-11-26 ENCOUNTER — Ambulatory Visit: Payer: Medicare Other

## 2016-12-19 DIAGNOSIS — M79672 Pain in left foot: Secondary | ICD-10-CM | POA: Diagnosis not present

## 2016-12-19 DIAGNOSIS — G894 Chronic pain syndrome: Secondary | ICD-10-CM | POA: Diagnosis not present

## 2016-12-19 DIAGNOSIS — M5416 Radiculopathy, lumbar region: Secondary | ICD-10-CM | POA: Diagnosis not present

## 2016-12-19 DIAGNOSIS — M5136 Other intervertebral disc degeneration, lumbar region: Secondary | ICD-10-CM | POA: Diagnosis not present

## 2016-12-19 DIAGNOSIS — M7989 Other specified soft tissue disorders: Secondary | ICD-10-CM | POA: Diagnosis not present

## 2017-01-04 ENCOUNTER — Encounter: Payer: Self-pay | Admitting: Internal Medicine

## 2017-01-04 ENCOUNTER — Ambulatory Visit (INDEPENDENT_AMBULATORY_CARE_PROVIDER_SITE_OTHER): Payer: Medicare Other | Admitting: Internal Medicine

## 2017-01-04 VITALS — BP 122/62 | HR 79 | Temp 98.6°F | Resp 12 | Ht 64.0 in | Wt 163.6 lb

## 2017-01-04 DIAGNOSIS — E119 Type 2 diabetes mellitus without complications: Secondary | ICD-10-CM | POA: Diagnosis not present

## 2017-01-04 DIAGNOSIS — I10 Essential (primary) hypertension: Secondary | ICD-10-CM

## 2017-01-04 DIAGNOSIS — E041 Nontoxic single thyroid nodule: Secondary | ICD-10-CM | POA: Diagnosis not present

## 2017-01-04 DIAGNOSIS — E7800 Pure hypercholesterolemia, unspecified: Secondary | ICD-10-CM

## 2017-01-04 DIAGNOSIS — G8929 Other chronic pain: Secondary | ICD-10-CM

## 2017-01-04 DIAGNOSIS — M5442 Lumbago with sciatica, left side: Secondary | ICD-10-CM | POA: Diagnosis not present

## 2017-01-04 DIAGNOSIS — M79672 Pain in left foot: Secondary | ICD-10-CM

## 2017-01-04 DIAGNOSIS — F439 Reaction to severe stress, unspecified: Secondary | ICD-10-CM

## 2017-01-04 DIAGNOSIS — K219 Gastro-esophageal reflux disease without esophagitis: Secondary | ICD-10-CM

## 2017-01-04 NOTE — Progress Notes (Signed)
Patient ID: Sherry Simon, female   DOB: Jun 14, 1946, 71 y.o.   MRN: [id number]   Subjective:    Patient ID: Sherry Simon, female    DOB: November 21, 1945, 71 y.o.   MRN: [id number]  HPI  Patient here for a scheduled follow up.  She is still having problems with her back.  Also pain in her left leg.  Seeing neurosurgery.  Is s/p injections previously.  Did not feel they helped.  She continues f/u.  They have discussed another injection.  She has also had increased pain in her left foot.  Feels different from the pain from her back.  Neurosurgery did a foot xray.  Negative.  Discussed referral to podiatry.  She wants to monitor.  Increased stress.  She is taking care of her husband who has MS and her mother is living with her as well.  Increased stress related to this.  Discussed with her today.  Overall she feels she is handling things relatively well.  Does not feel needs any further intervention at this time.  No chest pain.  Breathing stable.  No acid reflux.  No abdominal pain.  Bowel stable.     Past Medical History:  Diagnosis Date  . Anxiety and depression   . Colon polyps   . Depression   . Diabetes mellitus ([place])   . Endometriosis    requiring hysterectomy  . History of chicken pox   . Hypercholesterolemia   . Hypertension   . Nephrolithiasis   . Pericarditis    recurrent, unkown origin  . Tachycardia    Past Surgical History:  Procedure Laterality Date  . ABDOMINAL HYSTERECTOMY  1980  . APPENDECTOMY  1055  . BACK SURGERY  1005-2010   laminectomy  . CHOLECYSTECTOMY     open  . OOPHORECTOMY  1982  . TONSILLECTOMY     Family History  Problem Relation Age of Onset  . Heart disease Father     myocardial infarction - died 5  . Thyroid disease Mother   . Transient ischemic attack Mother     multiple  . Breast cancer Mother   . Hyperlipidemia Mother   . Kidney disease Mother   . Diabetes Mother   . Rheumatic fever Sister     mitral valve problems  . Breast  cancer Paternal Aunt   . Other Sister     Small vessel disease  . Colon cancer Neg Hx    Social History   Social History  . Marital status: Married    Spouse name: N/A  . Number of children: 3  . Years of education: N/A   Social History Main Topics  . Smoking status: Never Smoker  . Smokeless tobacco: Never Used  . Alcohol use No  . Drug use: No  . Sexual activity: No   Other Topics Concern  . None   Social History Narrative  . None    Outpatient Encounter Prescriptions as of 01/04/2017  Medication Sig  . aspirin 81 MG tablet Take 81 mg by mouth daily.  Marland Kitchen atorvastatin (LIPITOR) 40 MG tablet Take 1 tablet (40 mg total) by mouth daily.  . Calcium Carbonate-Vitamin D 600-400 MG-UNIT tablet Take by mouth.  . citalopram (CELEXA) 40 MG tablet Take 1 tablet (40 mg total) by mouth daily.  Marland Kitchen gabapentin (NEURONTIN) 600 MG tablet Take 2 tablets by mouth in  the morning and 3 tablets  by mouth in the evening  . glucose blood (ONE TOUCH ULTRA TEST) test  strip TEST BLOOD SUGAR TWICE A DAY  . latanoprost (XALATAN) 0.005 % ophthalmic solution INSTILL 1 DROP INTO THE  LEFT EYE AT BEDTIME  . losartan (COZAAR) 100 MG tablet Take 1 tablet (100 mg total) by mouth daily.  . metFORMIN (GLUCOPHAGE) 500 MG tablet TAKE 2 TABLETS BY MOUTH  EVERY MORNING AND TAKE 1  TABLET AT SUPPER  . oxyCODONE (OXY IR/ROXICODONE) 5 MG immediate release tablet May take up 3 tablets at a time every 6 hours  . pantoprazole (PROTONIX) 40 MG tablet Take 1 tablet (40 mg total) by mouth 2 (two) times daily.   No facility-administered encounter medications on file as of 01/04/2017.     Review of Systems  Constitutional: Negative for chills and unexpected weight change.  HENT: Negative for congestion and sinus pressure.   Respiratory: Negative for cough, chest tightness and shortness of breath.   Cardiovascular: Negative for chest pain, palpitations and leg swelling.  Gastrointestinal: Negative for abdominal pain,  diarrhea, nausea and vomiting.  Genitourinary: Negative for difficulty urinating and dysuria.  Musculoskeletal: Positive for back pain.       Left leg pain.  Left foot pain as outlined.   Skin: Negative for color change and rash.  Neurological: Negative for dizziness, light-headedness and headaches.  Psychiatric/Behavioral: Negative for agitation and dysphoric mood.       Objective:     Blood pressure rechecked by me:  126/72  Physical Exam  Constitutional: She appears well-developed and well-nourished. No distress.  HENT:  Nose: Nose normal.  Mouth/Throat: Oropharynx is clear and moist.  Neck: Neck supple. No thyromegaly present.  Cardiovascular: Normal rate and regular rhythm.   Pulmonary/Chest: Breath sounds normal. No respiratory distress. She has no wheezes.  Abdominal: Soft. Bowel sounds are normal. There is no tenderness.  Musculoskeletal: She exhibits no edema or tenderness.  Lymphadenopathy:    She has no cervical adenopathy.  Skin: No rash noted. No erythema.  Psychiatric: She has a normal mood and affect. Her behavior is normal.    BP 122/62 (BP Location: Left Arm, Patient Position: Sitting, Cuff Size: Normal)   Pulse 79   Temp 98.6 F (37 C) (Oral)   Resp 12   Ht _0  (1.626 m)   Wt 163 lb 9.6 oz (74.2 kg)   SpO2 98%   BMI 28.08 kg/m  Wt Readings from Last 3 Encounters:  01/04/17 163 lb 9.6 oz (74.2 kg)  04/03/16 159 lb 6 oz (72.3 kg)  01/18/16 159 lb (72.1 kg)     Lab Results  Component Value Date   WBC 4.6 05/23/2015   HGB 12.3 05/23/2015   HCT 36.8 05/23/2015   PLT 352.0 05/23/2015   GLUCOSE 98 11/29/2015   CHOL 216 (H) 11/29/2015   TRIG 125.0 11/29/2015   HDL 52.70 11/29/2015   LDLCALC 138 (H) 11/29/2015   ALT 12 11/29/2015   AST 12 11/29/2015   NA 139 11/29/2015   K 4.2 11/29/2015   CL 103 11/29/2015   CREATININE 0.61 11/29/2015   BUN 13 11/29/2015   CO2 26 11/29/2015   TSH 1.07 05/23/2015   HGBA1C 6.3 11/29/2015   MICROALBUR 0.5  08/13/2014    US Abdomen Complete  Result Date: 11/10/2015 CLINICAL DATA:  Right-sided pain extending to the right lower quadrant with 2-3 months occurring intermittently EXAM: ABDOMEN ULTRASOUND COMPLETE COMPARISON:  CT abdomen and pelvis of 04/13/2008 FINDINGS: Gallbladder: The gallbladder has previously been resected. Common bile duct: Diameter: The common bile duct is normal measuring  8 mm in diameter distally. Portions of the common bile duct are obscured by bowel gas. Liver: The liver is echogenic consistent with diffuse fatty infiltration as noted previously. No focal hepatic abnormality is seen. IVC: No abnormality visualized. Pancreas: The pancreas is completely obscured by overlying bowel gas and cannot be evaluated. Spleen: The spleen is normal measuring 6.6 cm. Right Kidney: Length: 13.0 cm. The ultrasound technologist questioned mild fullness of the right pelvocaliceal system of uncertain significance. Left Kidney: Length: 12.6 cm.  No hydronephrosis is seen. Abdominal aorta: The abdominal aorta is partially obscured by bowel gas but no aneurysmal dilatation is seen. Other findings: The study is somewhat compromised by large amount of overlying bowel gas. IMPRESSION: 1. Echogenic liver parenchyma consistent with diffuse fatty infiltration. No focal hepatic abnormality. 2. The pancreas is largely obscured by bowel gas. 3. Questionable mild fullness of the right pelvocaliceal system. Electronically Signed   By: Ivar Drape M.D.   On: 11/10/2015 10:43   US Transvaginal Non-ob  Result Date: 11/10/2015 CLINICAL DATA:  2-3 month history of intermittent right lower quadrant pain; history of partial hysterectomy, appendectomy, and cholecystectomy. The patient is postmenopausal EXAM: TRANSABDOMINAL AND TRANSVAGINAL ULTRASOUND OF PELVIS TECHNIQUE: Both transabdominal and transvaginal ultrasound examinations of the pelvis were performed. Transabdominal technique was performed for global imaging of the  pelvis including uterus, ovaries, adnexal regions, and pelvic cul-de-sac. It was necessary to proceed with endovaginal exam following the transabdominal exam to visualize the ovaries and adnexal structures. COMPARISON:  Abdominal and pelvic CT scan dated April 13, 2008 FINDINGS: Uterus The uterus is surgically absent. Right ovary The right ovary could not be visualized. Left ovary The left ovary is reportedly surgically absent. No adnexal masses are observed. Other findings No abnormal free fluid. IMPRESSION: 1. The uterus and left ovary are surgically absent. 2. Nonvisualization of the right ovary. No adnexal masses are observed. 3. Further evaluation with abdominal and pelvic CT scanning would be useful if the patient's symptoms persist. Electronically Signed   By: David  Martinique M.D.   On: 11/10/2015 10:38   US Pelvis Complete  Result Date: 11/10/2015 CLINICAL DATA:  2-3 month history of intermittent right lower quadrant pain; history of partial hysterectomy, appendectomy, and cholecystectomy. The patient is postmenopausal EXAM: TRANSABDOMINAL AND TRANSVAGINAL ULTRASOUND OF PELVIS TECHNIQUE: Both transabdominal and transvaginal ultrasound examinations of the pelvis were performed. Transabdominal technique was performed for global imaging of the pelvis including uterus, ovaries, adnexal regions, and pelvic cul-de-sac. It was necessary to proceed with endovaginal exam following the transabdominal exam to visualize the ovaries and adnexal structures. COMPARISON:  Abdominal and pelvic CT scan dated April 13, 2008 FINDINGS: Uterus The uterus is surgically absent. Right ovary The right ovary could not be visualized. Left ovary The left ovary is reportedly surgically absent. No adnexal masses are observed. Other findings No abnormal free fluid. IMPRESSION: 1. The uterus and left ovary are surgically absent. 2. Nonvisualization of the right ovary. No adnexal masses are observed. 3. Further evaluation with abdominal and  pelvic CT scanning would be useful if the patient's symptoms persist. Electronically Signed   By: David  Martinique M.D.   On: 11/10/2015 10:38       Assessment & Plan:   Problem List Items Addressed This Visit    Chronic back pain    s/p back surgery.  Persistent pain.  Followed by neurosurgery.        Diabetes mellitus ([place])    Low carb diet and exercise.  Follow met  b and a1c.        GERD (gastroesophageal reflux disease)    Controlled on protonix.       Hypercholesterolemia    On lipitor.  Low cholesterol diet and exercise.  Follow lipid panel and liver function tests.        Hypertension    Blood pressure under good control.  Continue same medication regimen.  Follow pressures.  Follow metabolic panel.        Stress    On citalopram.  Increased stress as outlined.  Discussed with her today.  She desires no further intervention at this time.  Follow.       Thyroid nodule    s/p benign biopsy.  Follow thyroid function tests.         Other Visit Diagnoses    Foot pain, left    -  Primary   Had negative xray as outlined.  Desires no further intervention.  follow.         Einar Pheasant, MD

## 2017-01-04 NOTE — Progress Notes (Signed)
Pre-visit discussion using our clinic review tool. No additional management support is needed unless otherwise documented below in the visit note.  

## 2017-01-05 ENCOUNTER — Telehealth: Payer: Self-pay | Admitting: Internal Medicine

## 2017-01-05 ENCOUNTER — Encounter: Payer: Self-pay | Admitting: Internal Medicine

## 2017-01-05 MED ORDER — GLUCOSE BLOOD VI STRP: ORAL_STRIP | 99 refills | Status: DC

## 2017-01-05 MED ORDER — PANTOPRAZOLE SODIUM 40 MG PO TBEC: 40.0000 mg | DELAYED_RELEASE_TABLET | Freq: Two times a day (BID) | ORAL | 1 refills | Status: DC

## 2017-01-05 MED ORDER — METFORMIN HCL 500 MG PO TABS: ORAL_TABLET | ORAL | 1 refills | Status: DC

## 2017-01-05 MED ORDER — LOSARTAN POTASSIUM 100 MG PO TABS: 100.0000 mg | ORAL_TABLET | Freq: Every day | ORAL | 1 refills | Status: DC

## 2017-01-05 MED ORDER — ATORVASTATIN CALCIUM 40 MG PO TABS: 40.0000 mg | ORAL_TABLET | Freq: Every day | ORAL | 1 refills | Status: DC

## 2017-01-05 MED ORDER — CITALOPRAM HYDROBROMIDE 40 MG PO TABS: 40.0000 mg | ORAL_TABLET | Freq: Every day | ORAL | 1 refills | Status: DC

## 2017-01-05 NOTE — Assessment & Plan Note (Signed)
s/p benign biopsy.  Follow thyroid function tests.

## 2017-01-05 NOTE — Assessment & Plan Note (Signed)
Low carb diet and exercise.  Follow met b and a1c.   

## 2017-01-05 NOTE — Assessment & Plan Note (Signed)
On lipitor.  Low cholesterol diet and exercise.  Follow lipid panel and liver function tests.   

## 2017-01-05 NOTE — Telephone Encounter (Signed)
My chart message sent to pt regarding mammogram.

## 2017-01-05 NOTE — Assessment & Plan Note (Signed)
On citalopram.  Increased stress as outlined.  Discussed with her today.  She desires no further intervention at this time.  Follow.

## 2017-01-05 NOTE — Assessment & Plan Note (Signed)
Controlled on protonix.   

## 2017-01-05 NOTE — Assessment & Plan Note (Signed)
s/p back surgery.  Persistent pain.  Followed by neurosurgery.

## 2017-01-05 NOTE — Assessment & Plan Note (Signed)
Blood pressure under good control.  Continue same medication regimen.  Follow pressures.  Follow metabolic panel.   

## 2017-01-07 ENCOUNTER — Other Ambulatory Visit: Payer: Self-pay | Admitting: Internal Medicine

## 2017-01-08 NOTE — Telephone Encounter (Signed)
Hard copy mailed to patient.

## 2017-01-21 DIAGNOSIS — C50911 Malignant neoplasm of unspecified site of right female breast: Secondary | ICD-10-CM | POA: Diagnosis not present

## 2017-01-21 DIAGNOSIS — Z170 Estrogen receptor positive status [ER+]: Secondary | ICD-10-CM | POA: Diagnosis not present

## 2017-01-21 DIAGNOSIS — C50811 Malignant neoplasm of overlapping sites of right female breast: Secondary | ICD-10-CM | POA: Diagnosis not present

## 2017-01-21 DIAGNOSIS — C50411 Malignant neoplasm of upper-outer quadrant of right female breast: Secondary | ICD-10-CM | POA: Diagnosis not present

## 2017-01-21 LAB — HM MAMMOGRAPHY

## 2017-02-06 DIAGNOSIS — M810 Age-related osteoporosis without current pathological fracture: Secondary | ICD-10-CM | POA: Diagnosis not present

## 2017-03-13 DIAGNOSIS — M5416 Radiculopathy, lumbar region: Secondary | ICD-10-CM | POA: Diagnosis not present

## 2017-03-13 DIAGNOSIS — M5136 Other intervertebral disc degeneration, lumbar region: Secondary | ICD-10-CM | POA: Diagnosis not present

## 2017-03-15 ENCOUNTER — Telehealth: Payer: Self-pay | Admitting: Internal Medicine

## 2017-03-15 NOTE — Telephone Encounter (Signed)
Pt called c/o bladder infections. Pt states that she is having burning and pressure when urinating. Please advise,thank you!  Call pt @ [phone]

## 2017-03-15 NOTE — Telephone Encounter (Signed)
Patient has a lot of burning and pressure when urinating x 7 days.  Patient has also taken three days of Cipro,  It did not help.  Patient has scheduled appointment on Monday @ 1430 with margaret for SX.

## 2017-03-18 ENCOUNTER — Ambulatory Visit: Payer: Medicare Other | Admitting: Family

## 2017-03-18 NOTE — Telephone Encounter (Signed)
Reason for call:UTI Symptoms: urinary pressure, dysuria, pelvic pain, no fever chills, urinary frequency  Duration X 1week  Medications: old script of Cipro 3 days stopped Thursday last week  Last seen for this problem: Seen by: Doesn't want to go to urgent care due to expensive copay, had appointment with Joycelyn Schmid cancelled, Appointment scheduled with Dr Lacinda Axon tomorrow

## 2017-03-18 NOTE — Telephone Encounter (Signed)
Called pt to reschedule her appt since M. Vidal Schwalbe is out of the office today. Pt would like to know if Dr. Nicki Reaper could maybe just call something in for her. Please advise, thank you!  Call pt @ [phone]

## 2017-03-19 ENCOUNTER — Ambulatory Visit (INDEPENDENT_AMBULATORY_CARE_PROVIDER_SITE_OTHER): Payer: Medicare Other | Admitting: Family Medicine

## 2017-03-19 ENCOUNTER — Encounter: Payer: Self-pay | Admitting: Family Medicine

## 2017-03-19 DIAGNOSIS — N3001 Acute cystitis with hematuria: Secondary | ICD-10-CM

## 2017-03-19 DIAGNOSIS — R300 Dysuria: Secondary | ICD-10-CM | POA: Diagnosis not present

## 2017-03-19 LAB — POC URINALSYSI DIPSTICK (AUTOMATED)
BILIRUBIN UA: NEGATIVE
Glucose, UA: NEGATIVE
KETONES UA: NEGATIVE
NITRITE UA: NEGATIVE
PH UA: 5.5 (ref 5.0–8.0)
Urobilinogen, UA: 0.2 E.U./dL

## 2017-03-19 MED ORDER — CEFDINIR 300 MG PO CAPS: 300.0000 mg | ORAL_CAPSULE | Freq: Two times a day (BID) | ORAL | 0 refills | Status: DC

## 2017-03-19 NOTE — Progress Notes (Signed)
Subjective:  Patient ID: Sherry Simon, female    DOB: 11-Sep-1946  Age: 71 y.o. MRN: [id number]  CC: UTI  HPI:  71 year old female with a history of UTI presents with concerns that she has a UTI.  Patient reports a one-week history of dysuria. She's had some nocturia as well. Associated lower abdominal pain and back pain. No fevers or chills. She has taken Azo with improvement in her symptoms. No known exacerbating factors. No other associated symptoms. No other complaints at this time.  Social Hx   Social History   Social History  . Marital status: Married    Spouse name: N/A  . Number of children: 3  . Years of education: N/A   Social History Main Topics  . Smoking status: Never Smoker  . Smokeless tobacco: Never Used  . Alcohol use No  . Drug use: No  . Sexual activity: No   Other Topics Concern  . None   Social History Narrative  . None    Review of Systems  Constitutional: Negative for fever.  Gastrointestinal: Positive for abdominal pain.  Genitourinary: Positive for dysuria.       Nocturia.   Objective:  BP 116/68 (BP Location: Left Arm, Patient Position: Sitting, Cuff Size: Normal)   Pulse 76   Temp 98.1 F (36.7 C) (Oral)   Wt 160 lb 6 oz (72.7 kg)   SpO2 98%   BMI 27.53 kg/m   BP/Weight 03/19/2017 01/04/2017 01/07/6062  Systolic BP 016 010 932  Diastolic BP 68 62 70  Wt. (Lbs) 160.38 163.6 159.38  BMI 27.53 28.08 27.79   Physical Exam  Constitutional: She is oriented to person, place, and time. She appears well-developed. No distress.  Cardiovascular: Normal rate and regular rhythm.   No murmur heard. Pulmonary/Chest: Effort normal and breath sounds normal. She has no wheezes. She has no rales.  Abdominal:  Soft, nondistended. Mild suprapubic tenderness. No rebound or guarding.  Neurological: She is alert and oriented to person, place, and time.  Psychiatric: She has a normal mood and affect.  Vitals reviewed.   Lab Results  Component  Value Date   WBC 4.6 05/23/2015   HGB 12.3 05/23/2015   HCT 36.8 05/23/2015   PLT 352.0 05/23/2015   GLUCOSE 98 11/29/2015   CHOL 216 (H) 11/29/2015   TRIG 125.0 11/29/2015   HDL 52.70 11/29/2015   LDLCALC 138 (H) 11/29/2015   ALT 12 11/29/2015   AST 12 11/29/2015   NA 139 11/29/2015   K 4.2 11/29/2015   CL 103 11/29/2015   CREATININE 0.61 11/29/2015   BUN 13 11/29/2015   CO2 26 11/29/2015   TSH 1.07 05/23/2015   HGBA1C 6.3 11/29/2015   MICROALBUR 0.5 08/13/2014   Results for orders placed or performed in visit on 03/19/17 (from the past 24 hour(s))  POCT Urinalysis Dipstick (Automated)     Status: Abnormal   Collection Time: 03/19/17  9:44 AM  Result Value Ref Range   Color, UA yellow    Clarity, UA cloudy    Glucose, UA negative    Bilirubin, UA negative    Ketones, UA negative    Spec Grav, UA <=1.005 (A) 1.010 - 1.025   Blood, UA large    pH, UA 5.5 5.0 - 8.0   Protein, UA trace    Urobilinogen, UA 0.2 0.2 or 1.0 E.U./dL   Nitrite, UA negative    Leukocytes, UA Large (3+) (A) Negative    Assessment & Plan:  Problem List Items Addressed This Visit      Genitourinary   UTI (urinary tract infection)    History of UTI. Now experiencing signs and symptoms of acute UTI. Treating empirically with Omnicef based on prior cultures. Obtaining culture; will change antibiotic if needed based on culture results.      Relevant Medications   cefdinir (OMNICEF) 300 MG capsule   Other Relevant Orders   POCT Urinalysis Dipstick (Automated) (Completed)   Urine Culture     Meds ordered this encounter  Medications  . cefdinir (OMNICEF) 300 MG capsule    Sig: Take 1 capsule (300 mg total) by mouth 2 (two) times daily.    Dispense:  14 capsule    Refill:  0   Follow-up: PRN  [place]

## 2017-03-19 NOTE — Assessment & Plan Note (Signed)
History of UTI. Now experiencing signs and symptoms of acute UTI. Treating empirically with Omnicef based on prior cultures. Obtaining culture; will change antibiotic if needed based on culture results.

## 2017-03-19 NOTE — Patient Instructions (Signed)
Antibiotic as prescribed. ° °We will call regarding the culture. ° °Take care ° °Dr. Aydin Cavalieri  °

## 2017-03-21 ENCOUNTER — Other Ambulatory Visit: Payer: Self-pay | Admitting: Family Medicine

## 2017-03-21 LAB — URINE CULTURE

## 2017-03-21 MED ORDER — NITROFURANTOIN MONOHYD MACRO 100 MG PO CAPS: 100.0000 mg | ORAL_CAPSULE | Freq: Two times a day (BID) | ORAL | 0 refills | Status: DC

## 2017-03-22 ENCOUNTER — Telehealth: Payer: Self-pay

## 2017-03-22 NOTE — Telephone Encounter (Signed)
Disregard this message saw the message in result note basket. Informed pt that you sent in a new medication yesterday for the pt.

## 2017-03-22 NOTE — Telephone Encounter (Signed)
Pt called and stated that she saw you on Monday for a UTI and was given Omnicef on Tuesday. The pt stated that she is still having the same symptoms, nothing has gotten better. Pt is wanting to know if she if you thought that she needed to try a different antibiotic.

## 2017-03-25 DIAGNOSIS — H35373 Puckering of macula, bilateral: Secondary | ICD-10-CM | POA: Diagnosis not present

## 2017-03-25 DIAGNOSIS — E119 Type 2 diabetes mellitus without complications: Secondary | ICD-10-CM | POA: Diagnosis not present

## 2017-03-25 DIAGNOSIS — H442A2 Degenerative myopia with choroidal neovascularization, left eye: Secondary | ICD-10-CM | POA: Diagnosis not present

## 2017-03-25 DIAGNOSIS — H35052 Retinal neovascularization, unspecified, left eye: Secondary | ICD-10-CM | POA: Diagnosis not present

## 2017-03-25 DIAGNOSIS — H43811 Vitreous degeneration, right eye: Secondary | ICD-10-CM | POA: Diagnosis not present

## 2017-03-28 DIAGNOSIS — M5136 Other intervertebral disc degeneration, lumbar region: Secondary | ICD-10-CM | POA: Diagnosis not present

## 2017-03-28 DIAGNOSIS — M48061 Spinal stenosis, lumbar region without neurogenic claudication: Secondary | ICD-10-CM | POA: Diagnosis not present

## 2017-03-28 DIAGNOSIS — M5416 Radiculopathy, lumbar region: Secondary | ICD-10-CM | POA: Diagnosis not present

## 2017-03-28 DIAGNOSIS — M4316 Spondylolisthesis, lumbar region: Secondary | ICD-10-CM | POA: Diagnosis not present

## 2017-04-16 DIAGNOSIS — H903 Sensorineural hearing loss, bilateral: Secondary | ICD-10-CM | POA: Diagnosis not present

## 2017-04-16 DIAGNOSIS — H6123 Impacted cerumen, bilateral: Secondary | ICD-10-CM | POA: Diagnosis not present

## 2017-04-25 DIAGNOSIS — H35052 Retinal neovascularization, unspecified, left eye: Secondary | ICD-10-CM | POA: Diagnosis not present

## 2017-04-25 DIAGNOSIS — H43813 Vitreous degeneration, bilateral: Secondary | ICD-10-CM | POA: Diagnosis not present

## 2017-04-25 DIAGNOSIS — H3322 Serous retinal detachment, left eye: Secondary | ICD-10-CM | POA: Diagnosis not present

## 2017-04-25 DIAGNOSIS — E119 Type 2 diabetes mellitus without complications: Secondary | ICD-10-CM | POA: Diagnosis not present

## 2017-04-25 DIAGNOSIS — Z961 Presence of intraocular lens: Secondary | ICD-10-CM | POA: Diagnosis not present

## 2017-04-25 DIAGNOSIS — H35372 Puckering of macula, left eye: Secondary | ICD-10-CM | POA: Diagnosis not present

## 2017-04-25 DIAGNOSIS — H4422 Degenerative myopia, left eye: Secondary | ICD-10-CM | POA: Diagnosis not present

## 2017-04-25 DIAGNOSIS — H43811 Vitreous degeneration, right eye: Secondary | ICD-10-CM | POA: Diagnosis not present

## 2017-04-26 DIAGNOSIS — H43813 Vitreous degeneration, bilateral: Secondary | ICD-10-CM | POA: Diagnosis not present

## 2017-04-26 DIAGNOSIS — Z961 Presence of intraocular lens: Secondary | ICD-10-CM | POA: Diagnosis not present

## 2017-04-26 DIAGNOSIS — H4422 Degenerative myopia, left eye: Secondary | ICD-10-CM | POA: Diagnosis not present

## 2017-04-26 DIAGNOSIS — H04123 Dry eye syndrome of bilateral lacrimal glands: Secondary | ICD-10-CM | POA: Diagnosis not present

## 2017-04-26 DIAGNOSIS — H35052 Retinal neovascularization, unspecified, left eye: Secondary | ICD-10-CM | POA: Diagnosis not present

## 2017-05-06 DIAGNOSIS — M5416 Radiculopathy, lumbar region: Secondary | ICD-10-CM | POA: Diagnosis not present

## 2017-05-06 DIAGNOSIS — Z9889 Other specified postprocedural states: Secondary | ICD-10-CM | POA: Diagnosis not present

## 2017-05-15 ENCOUNTER — Telehealth: Payer: Self-pay | Admitting: Internal Medicine

## 2017-05-15 NOTE — Telephone Encounter (Signed)
Pt was driving and will call back to schedule AWV  Last AWV 10/11/15

## 2017-06-04 ENCOUNTER — Encounter: Payer: Self-pay | Admitting: Internal Medicine

## 2017-06-04 ENCOUNTER — Ambulatory Visit (INDEPENDENT_AMBULATORY_CARE_PROVIDER_SITE_OTHER): Payer: Medicare Other | Admitting: Internal Medicine

## 2017-06-04 VITALS — BP 120/68 | HR 67 | Temp 99.1°F | Resp 12 | Ht 64.0 in | Wt 164.6 lb

## 2017-06-04 DIAGNOSIS — E119 Type 2 diabetes mellitus without complications: Secondary | ICD-10-CM

## 2017-06-04 DIAGNOSIS — G8929 Other chronic pain: Secondary | ICD-10-CM | POA: Diagnosis not present

## 2017-06-04 DIAGNOSIS — K219 Gastro-esophageal reflux disease without esophagitis: Secondary | ICD-10-CM | POA: Diagnosis not present

## 2017-06-04 DIAGNOSIS — M5442 Lumbago with sciatica, left side: Secondary | ICD-10-CM

## 2017-06-04 DIAGNOSIS — E7800 Pure hypercholesterolemia, unspecified: Secondary | ICD-10-CM

## 2017-06-04 DIAGNOSIS — I10 Essential (primary) hypertension: Secondary | ICD-10-CM

## 2017-06-04 DIAGNOSIS — F439 Reaction to severe stress, unspecified: Secondary | ICD-10-CM | POA: Diagnosis not present

## 2017-06-04 DIAGNOSIS — E041 Nontoxic single thyroid nodule: Secondary | ICD-10-CM

## 2017-06-04 LAB — HEPATIC FUNCTION PANEL
ALK PHOS: 44 U/L (ref 39–117)
ALT: 14 U/L (ref 0–35)
AST: 14 U/L (ref 0–37)
Albumin: 4.4 g/dL (ref 3.5–5.2)
BILIRUBIN DIRECT: 0.2 mg/dL (ref 0.0–0.3)
BILIRUBIN TOTAL: 1.1 mg/dL (ref 0.2–1.2)
TOTAL PROTEIN: 6.6 g/dL (ref 6.0–8.3)

## 2017-06-04 LAB — LIPID PANEL
CHOL/HDL RATIO: 3
Cholesterol: 163 mg/dL (ref 0–200)
HDL: 56.3 mg/dL (ref >39.00)
LDL CALC: 89 mg/dL (ref 0–99)
NONHDL: 107.14
Triglycerides: 91 mg/dL (ref 0.0–149.0)
VLDL: 18.2 mg/dL (ref 0.0–40.0)

## 2017-06-04 LAB — CBC WITH DIFFERENTIAL/PLATELET
BASOS ABS: 0 10*3/uL (ref 0.0–0.1)
Basophils Relative: 0.5 % (ref 0.0–3.0)
EOS PCT: 1.9 % (ref 0.0–5.0)
Eosinophils Absolute: 0.1 10*3/uL (ref 0.0–0.7)
HCT: 35.4 % — ABNORMAL LOW (ref 36.0–46.0)
HEMOGLOBIN: 11.6 g/dL — AB (ref 12.0–15.0)
LYMPHS ABS: 1.5 10*3/uL (ref 0.7–4.0)
Lymphocytes Relative: 39.9 % (ref 12.0–46.0)
MCHC: 32.7 g/dL (ref 30.0–36.0)
MCV: 91.8 fl (ref 78.0–100.0)
MONO ABS: 0.3 10*3/uL (ref 0.1–1.0)
Monocytes Relative: 8.7 % (ref 3.0–12.0)
NEUTROS PCT: 49 % (ref 43.0–77.0)
Neutro Abs: 1.8 10*3/uL (ref 1.4–7.7)
Platelets: 309 10*3/uL (ref 150.0–400.0)
RBC: 3.86 Mil/uL — AB (ref 3.87–5.11)
RDW: 13.8 % (ref 11.5–15.5)
WBC: 3.7 10*3/uL — AB (ref 4.0–10.5)

## 2017-06-04 LAB — BASIC METABOLIC PANEL
BUN: 13 mg/dL (ref 6–23)
CALCIUM: 9.6 mg/dL (ref 8.4–10.5)
CO2: 28 mEq/L (ref 19–32)
Chloride: 104 mEq/L (ref 96–112)
Creatinine, Ser: 0.67 mg/dL (ref 0.40–1.20)
GFR: 92.15 mL/min (ref >60.00)
Glucose, Bld: 111 mg/dL — ABNORMAL HIGH (ref 70–99)
POTASSIUM: 5 meq/L (ref 3.5–5.1)
SODIUM: 139 meq/L (ref 135–145)

## 2017-06-04 LAB — HM DIABETES FOOT EXAM

## 2017-06-04 LAB — TSH: TSH: 1.79 u[IU]/mL (ref 0.35–4.50)

## 2017-06-04 LAB — HEMOGLOBIN A1C: HEMOGLOBIN A1C: 6.4 % (ref 4.6–6.5)

## 2017-06-04 LAB — MICROALBUMIN / CREATININE URINE RATIO
Creatinine,U: 30.9 mg/dL
Microalb Creat Ratio: 2.3 mg/g (ref 0.0–30.0)
Microalb, Ur: <0.7 mg/dL (ref 0.0–1.9)

## 2017-06-04 MED ORDER — PANTOPRAZOLE SODIUM 40 MG PO TBEC: 40.0000 mg | DELAYED_RELEASE_TABLET | Freq: Two times a day (BID) | ORAL | 1 refills | Status: DC

## 2017-06-04 MED ORDER — ATORVASTATIN CALCIUM 40 MG PO TABS: 40.0000 mg | ORAL_TABLET | Freq: Every day | ORAL | 1 refills | Status: DC

## 2017-06-04 MED ORDER — LOSARTAN POTASSIUM 100 MG PO TABS: 100.0000 mg | ORAL_TABLET | Freq: Every day | ORAL | 1 refills | Status: DC

## 2017-06-04 MED ORDER — ACYCLOVIR 400 MG PO TABS: ORAL_TABLET | ORAL | 1 refills | Status: DC

## 2017-06-04 MED ORDER — BUSPIRONE HCL 5 MG PO TABS: 5.0000 mg | ORAL_TABLET | Freq: Every day | ORAL | 1 refills | Status: DC | PRN

## 2017-06-04 MED ORDER — METFORMIN HCL 500 MG PO TABS: ORAL_TABLET | ORAL | 1 refills | Status: DC

## 2017-06-04 MED ORDER — CITALOPRAM HYDROBROMIDE 40 MG PO TABS: 40.0000 mg | ORAL_TABLET | Freq: Every day | ORAL | 1 refills | Status: DC

## 2017-06-04 NOTE — Progress Notes (Signed)
Patient ID: Sherry Simon, female   DOB: August 14, 1946, 71 y.o.   MRN: [id number]   Subjective:    Patient ID: Sherry Simon, female    DOB: July 18, 1946, 71 y.o.   MRN: [id number]  HPI  Patient here for a scheduled follow up.  She has been under increased stress recently.  Increased stress with her husband's medical issues and her mother's issues.  She is primary caretaker for both of them.  Mother lives with her and her husband.  Discussed at length with her today.  She feels she needs to have something if needed.  Discussed options.  Discussed addition of buspar.  She does report noticing recently some discomfort in her back - under left shoulder blade.  Notices when she leans back and puts pressure on the area.  Has chronic back pain.  Is s/p previous surgeries and is followed by surgery.  F/u MRI and planning for epidural.  The pain under her shoulder blade just started.  Again, only notices when leans back on the area.  (direct point tenderness).  States she could have aggravated it, lifting and pulling at home.  No chest pain.  Breathing stable.  Eating.  No abdominal pain.  Bowels moving.  States sugars ok.     Past Medical History:  Diagnosis Date  . Anxiety and depression   . Colon polyps   . Depression   . Diabetes mellitus ([place])   . Endometriosis    requiring hysterectomy  . History of chicken pox   . Hypercholesterolemia   . Hypertension   . Nephrolithiasis   . Pericarditis    recurrent, unkown origin  . Tachycardia    Past Surgical History:  Procedure Laterality Date  . ABDOMINAL HYSTERECTOMY  1980  . APPENDECTOMY  1055  . BACK SURGERY  1005-2010   laminectomy  . CHOLECYSTECTOMY     open  . OOPHORECTOMY  1982  . TONSILLECTOMY     Family History  Problem Relation Age of Onset  . Heart disease Father        myocardial infarction - died 44  . Thyroid disease Mother   . Transient ischemic attack Mother        multiple  . Breast cancer Mother   .  Hyperlipidemia Mother   . Kidney disease Mother   . Diabetes Mother   . Rheumatic fever Sister        mitral valve problems  . Breast cancer Paternal Aunt   . Other Sister        Small vessel disease  . Colon cancer Neg Hx    Social History   Social History  . Marital status: Married    Spouse name: N/A  . Number of children: 3  . Years of education: N/A   Social History Main Topics  . Smoking status: Never Smoker  . Smokeless tobacco: Never Used  . Alcohol use No  . Drug use: No  . Sexual activity: No   Other Topics Concern  . None   Social History Narrative  . None    Outpatient Encounter Prescriptions as of 06/04/2017  Medication Sig  . aspirin 81 MG tablet Take 81 mg by mouth daily.  Marland Kitchen atorvastatin (LIPITOR) 40 MG tablet Take 1 tablet (40 mg total) by mouth daily.  . Calcium Carbonate-Vitamin D 600-400 MG-UNIT tablet Take by mouth.  . citalopram (CELEXA) 40 MG tablet Take 1 tablet (40 mg total) by mouth daily.  Marland Kitchen gabapentin (NEURONTIN) 600 MG  tablet TAKE 2 TABLETS BY MOUTH IN  THE MORNING AND 3 TABLETS  BY MOUTH IN THE EVENING  . glucose blood (ONE TOUCH ULTRA TEST) test strip TEST BLOOD SUGAR TWICE A DAY  . latanoprost (XALATAN) 0.005 % ophthalmic solution INSTILL 1 DROP INTO THE  LEFT EYE AT BEDTIME  . losartan (COZAAR) 100 MG tablet Take 1 tablet (100 mg total) by mouth daily.  . metFORMIN (GLUCOPHAGE) 500 MG tablet TAKE 2 TABLETS BY MOUTH  EVERY MORNING AND TAKE 1  TABLET AT SUPPER  . oxyCODONE (OXY IR/ROXICODONE) 5 MG immediate release tablet May take up 3 tablets at a time every 6 hours  . pantoprazole (PROTONIX) 40 MG tablet Take 1 tablet (40 mg total) by mouth 2 (two) times daily.  . [DISCONTINUED] atorvastatin (LIPITOR) 40 MG tablet Take 1 tablet (40 mg total) by mouth daily.  . [DISCONTINUED] cefdinir (OMNICEF) 300 MG capsule Take 1 capsule (300 mg total) by mouth 2 (two) times daily.  . [DISCONTINUED] citalopram (CELEXA) 40 MG tablet Take 1 tablet (40 mg  total) by mouth daily.  . [DISCONTINUED] losartan (COZAAR) 100 MG tablet Take 1 tablet (100 mg total) by mouth daily.  . [DISCONTINUED] metFORMIN (GLUCOPHAGE) 500 MG tablet TAKE 2 TABLETS BY MOUTH  EVERY MORNING AND TAKE 1  TABLET AT SUPPER  . [DISCONTINUED] nitrofurantoin, macrocrystal-monohydrate, (MACROBID) 100 MG capsule Take 1 capsule (100 mg total) by mouth 2 (two) times daily.  . [DISCONTINUED] pantoprazole (PROTONIX) 40 MG tablet Take 1 tablet (40 mg total) by mouth 2 (two) times daily.  Marland Kitchen acyclovir (ZOVIRAX) 400 MG tablet Take one tablet daily  . [DISCONTINUED] busPIRone (BUSPAR) 5 MG tablet Take 1 tablet (5 mg total) by mouth daily as needed.   No facility-administered encounter medications on file as of 06/04/2017.     Review of Systems  Constitutional: Negative for appetite change and unexpected weight change.  HENT: Negative for congestion and sinus pressure.   Respiratory: Negative for cough, chest tightness and shortness of breath.   Cardiovascular: Negative for chest pain, palpitations and leg swelling.  Gastrointestinal: Negative for abdominal pain, diarrhea, nausea and vomiting.  Genitourinary: Negative for difficulty urinating and dysuria.  Musculoskeletal: Positive for back pain. Negative for joint swelling.  Skin: Negative for color change and rash.  Neurological: Negative for dizziness and headaches.  Psychiatric/Behavioral: Negative for agitation.       Increased stress as outlined.         Objective:     Blood pressure rechecked by me:  122/76  Physical Exam  Constitutional: She appears well-developed and well-nourished. No distress.  HENT:  Nose: Nose normal.  Mouth/Throat: Oropharynx is clear and moist.  Neck: Neck supple. No thyromegaly present.  Cardiovascular: Normal rate and regular rhythm.   Pulmonary/Chest: Breath sounds normal. No respiratory distress. She has no wheezes.  Abdominal: Soft. Bowel sounds are normal. There is no tenderness.    Musculoskeletal: She exhibits no edema or tenderness.  No significant tenderness to palpation over her upper back.  Minimal noted.  Foot exam - intact to light touch, pin prick.  No lesions.    Lymphadenopathy:    She has no cervical adenopathy.  Skin: No rash noted. No erythema.  Psychiatric: She has a normal mood and affect. Her behavior is normal.    BP 120/68 (BP Location: Left Arm, Patient Position: Sitting, Cuff Size: Normal)   Pulse 67   Temp 99.1 F (37.3 C) (Oral)   Resp 12   Ht 5'  4" (1.626 m)   Wt 164 lb 9.6 oz (74.7 kg)   SpO2 96%   BMI 28.25 kg/m  Wt Readings from Last 3 Encounters:  06/04/17 164 lb 9.6 oz (74.7 kg)  03/19/17 160 lb 6 oz (72.7 kg)  01/04/17 163 lb 9.6 oz (74.2 kg)     Lab Results  Component Value Date   WBC 3.7 (L) 06/04/2017   HGB 11.6 (L) 06/04/2017   HCT 35.4 (L) 06/04/2017   PLT 309.0 06/04/2017   GLUCOSE 111 (H) 06/04/2017   CHOL 163 06/04/2017   TRIG 91.0 06/04/2017   HDL 56.30 06/04/2017   LDLCALC 89 06/04/2017   ALT 14 06/04/2017   AST 14 06/04/2017   NA 139 06/04/2017   K 5.0 06/04/2017   CL 104 06/04/2017   CREATININE 0.67 06/04/2017   BUN 13 06/04/2017   CO2 28 06/04/2017   TSH 1.79 06/04/2017   HGBA1C 6.4 06/04/2017   MICROALBUR <0.7 06/04/2017    US Abdomen Complete  Result Date: 11/10/2015 CLINICAL DATA:  Right-sided pain extending to the right lower quadrant with 2-3 months occurring intermittently EXAM: ABDOMEN ULTRASOUND COMPLETE COMPARISON:  CT abdomen and pelvis of 04/13/2008 FINDINGS: Gallbladder: The gallbladder has previously been resected. Common bile duct: Diameter: The common bile duct is normal measuring 8 mm in diameter distally. Portions of the common bile duct are obscured by bowel gas. Liver: The liver is echogenic consistent with diffuse fatty infiltration as noted previously. No focal hepatic abnormality is seen. IVC: No abnormality visualized. Pancreas: The pancreas is completely obscured by overlying  bowel gas and cannot be evaluated. Spleen: The spleen is normal measuring 6.6 cm. Right Kidney: Length: 13.0 cm. The ultrasound technologist questioned mild fullness of the right pelvocaliceal system of uncertain significance. Left Kidney: Length: 12.6 cm.  No hydronephrosis is seen. Abdominal aorta: The abdominal aorta is partially obscured by bowel gas but no aneurysmal dilatation is seen. Other findings: The study is somewhat compromised by large amount of overlying bowel gas. IMPRESSION: 1. Echogenic liver parenchyma consistent with diffuse fatty infiltration. No focal hepatic abnormality. 2. The pancreas is largely obscured by bowel gas. 3. Questionable mild fullness of the right pelvocaliceal system. Electronically Signed   By: Ivar Drape M.D.   On: 11/10/2015 10:43   US Transvaginal Non-ob  Result Date: 11/10/2015 CLINICAL DATA:  2-3 month history of intermittent right lower quadrant pain; history of partial hysterectomy, appendectomy, and cholecystectomy. The patient is postmenopausal EXAM: TRANSABDOMINAL AND TRANSVAGINAL ULTRASOUND OF PELVIS TECHNIQUE: Both transabdominal and transvaginal ultrasound examinations of the pelvis were performed. Transabdominal technique was performed for global imaging of the pelvis including uterus, ovaries, adnexal regions, and pelvic cul-de-sac. It was necessary to proceed with endovaginal exam following the transabdominal exam to visualize the ovaries and adnexal structures. COMPARISON:  Abdominal and pelvic CT scan dated April 13, 2008 FINDINGS: Uterus The uterus is surgically absent. Right ovary The right ovary could not be visualized. Left ovary The left ovary is reportedly surgically absent. No adnexal masses are observed. Other findings No abnormal free fluid. IMPRESSION: 1. The uterus and left ovary are surgically absent. 2. Nonvisualization of the right ovary. No adnexal masses are observed. 3. Further evaluation with abdominal and pelvic CT scanning would be  useful if the patient's symptoms persist. Electronically Signed   By: David  Martinique M.D.   On: 11/10/2015 10:38   US Pelvis Complete  Result Date: 11/10/2015 CLINICAL DATA:  2-3 month history of intermittent right lower quadrant pain;  history of partial hysterectomy, appendectomy, and cholecystectomy. The patient is postmenopausal EXAM: TRANSABDOMINAL AND TRANSVAGINAL ULTRASOUND OF PELVIS TECHNIQUE: Both transabdominal and transvaginal ultrasound examinations of the pelvis were performed. Transabdominal technique was performed for global imaging of the pelvis including uterus, ovaries, adnexal regions, and pelvic cul-de-sac. It was necessary to proceed with endovaginal exam following the transabdominal exam to visualize the ovaries and adnexal structures. COMPARISON:  Abdominal and pelvic CT scan dated April 13, 2008 FINDINGS: Uterus The uterus is surgically absent. Right ovary The right ovary could not be visualized. Left ovary The left ovary is reportedly surgically absent. No adnexal masses are observed. Other findings No abnormal free fluid. IMPRESSION: 1. The uterus and left ovary are surgically absent. 2. Nonvisualization of the right ovary. No adnexal masses are observed. 3. Further evaluation with abdominal and pelvic CT scanning would be useful if the patient's symptoms persist. Electronically Signed   By: David  Martinique M.D.   On: 11/10/2015 10:38       Assessment & Plan:   Problem List Items Addressed This Visit    Chronic back pain    S/p back surgery.  Followed by neurosurgery.  Now with some pain under left shoulder blade.  Only occurs with direct pressure on the area.  Discussed with her today. She desires no further intervention.  Stretches.  Follow.        Diabetes mellitus ([place]) - Primary    Low carb diet and exercise.  Follow met b and a1c.  Keep up to date with eye checks.        Relevant Medications   atorvastatin (LIPITOR) 40 MG tablet   losartan (COZAAR) 100 MG tablet    metFORMIN (GLUCOPHAGE) 500 MG tablet   Other Relevant Orders   Hemoglobin A1c (Completed)   Microalbumin / creatinine urine ratio (Completed)   GERD (gastroesophageal reflux disease)    Controlled on protonix.        Relevant Medications   pantoprazole (PROTONIX) 40 MG tablet   Hypercholesterolemia    On lipitor.  Low cholesterol diet and exercise.  Follow lipid panel and liver function tests.        Relevant Medications   atorvastatin (LIPITOR) 40 MG tablet   losartan (COZAAR) 100 MG tablet   Other Relevant Orders   Hepatic function panel (Completed)   Lipid panel (Completed)   Hypertension    Blood pressure under good control.  Continue same medication regimen.  Follow pressures.  Follow metabolic panel.        Relevant Medications   atorvastatin (LIPITOR) 40 MG tablet   citalopram (CELEXA) 40 MG tablet   losartan (COZAAR) 100 MG tablet   Other Relevant Orders   CBC with Differential/Platelet (Completed)   Basic metabolic panel (Completed)   Stress    On citalopram.  Increased stress as outlined.  Discussed with her today.  She does not desire referral to psychiatry or counseling.  Will add buspar.  Follow closely.        Thyroid nodule    S/p benign biopsy.  Recheck tsh.        Relevant Orders   TSH (Completed)     I spent 40 minutes with the patient and more than 50% of the time was spent in consultation regarding the above.  Time spent discussing her current increased stress and going over current symptoms and concerns.  Time also spent discussing further w/up and plans for further treatment.     Einar Pheasant, MD

## 2017-06-04 NOTE — Assessment & Plan Note (Signed)
Has been followed by oncology.  Mammogram 01/21/17 - Birads II

## 2017-06-05 ENCOUNTER — Other Ambulatory Visit: Payer: Self-pay

## 2017-06-05 ENCOUNTER — Other Ambulatory Visit: Payer: Self-pay | Admitting: Internal Medicine

## 2017-06-05 ENCOUNTER — Telehealth: Payer: Self-pay | Admitting: Internal Medicine

## 2017-06-05 DIAGNOSIS — D649 Anemia, unspecified: Secondary | ICD-10-CM

## 2017-06-05 MED ORDER — BUSPIRONE HCL 5 MG PO TABS: 5.0000 mg | ORAL_TABLET | Freq: Every day | ORAL | 1 refills | Status: DC | PRN

## 2017-06-05 NOTE — Telephone Encounter (Signed)
Pt was informed about her Rx.

## 2017-06-05 NOTE — Telephone Encounter (Signed)
Called patent l/m to let her know that script has been called to new pharmacy

## 2017-06-05 NOTE — Progress Notes (Signed)
Order placed for f/u labs.  

## 2017-06-05 NOTE — Telephone Encounter (Signed)
Pt pharmacy is out of pt medication. Pt wants to know if the busPIRone (BUSPAR) 5 MG tablet can be called into [facility] in [place]? Please advise?  Call pt @ [phone]. Thank you!

## 2017-06-06 ENCOUNTER — Telehealth: Payer: Self-pay | Admitting: *Deleted

## 2017-06-06 NOTE — Telephone Encounter (Signed)
See lab results.  

## 2017-06-06 NOTE — Telephone Encounter (Signed)
Pt requested lab results Pt contact [phone]

## 2017-06-07 ENCOUNTER — Encounter: Payer: Self-pay | Admitting: Internal Medicine

## 2017-06-07 NOTE — Assessment & Plan Note (Signed)
On lipitor.  Low cholesterol diet and exercise.  Follow lipid panel and liver function tests.   

## 2017-06-07 NOTE — Assessment & Plan Note (Signed)
S/p back surgery.  Followed by neurosurgery.  Now with some pain under left shoulder blade.  Only occurs with direct pressure on the area.  Discussed with her today. She desires no further intervention.  Stretches.  Follow.

## 2017-06-07 NOTE — Assessment & Plan Note (Signed)
S/p benign biopsy.  Recheck tsh.

## 2017-06-07 NOTE — Assessment & Plan Note (Signed)
Controlled on protonix.   

## 2017-06-07 NOTE — Assessment & Plan Note (Signed)
On citalopram.  Increased stress as outlined.  Discussed with her today.  She does not desire referral to psychiatry or counseling.  Will add buspar.  Follow closely.

## 2017-06-07 NOTE — Assessment & Plan Note (Signed)
Low carb diet and exercise.  Follow met b and a1c.  Keep up to date with eye checks.

## 2017-06-07 NOTE — Assessment & Plan Note (Signed)
Blood pressure under good control.  Continue same medication regimen.  Follow pressures.  Follow metabolic panel.   

## 2017-06-18 NOTE — Telephone Encounter (Signed)
Left pt message asking to call Ebony Hail back directly at [phone] to schedule AWV. Thanks!  *NOTE* Last AWV 10/11/15

## 2017-06-20 ENCOUNTER — Other Ambulatory Visit (INDEPENDENT_AMBULATORY_CARE_PROVIDER_SITE_OTHER): Payer: Medicare Other

## 2017-06-20 DIAGNOSIS — D649 Anemia, unspecified: Secondary | ICD-10-CM

## 2017-06-20 LAB — CBC WITH DIFFERENTIAL/PLATELET
BASOS ABS: 0 10*3/uL (ref 0.0–0.1)
Basophils Relative: 0.5 % (ref 0.0–3.0)
EOS ABS: 0.1 10*3/uL (ref 0.0–0.7)
Eosinophils Relative: 1.9 % (ref 0.0–5.0)
HCT: 35.2 % — ABNORMAL LOW (ref 36.0–46.0)
HEMOGLOBIN: 11.5 g/dL — AB (ref 12.0–15.0)
LYMPHS PCT: 37.8 % (ref 12.0–46.0)
Lymphs Abs: 1.6 10*3/uL (ref 0.7–4.0)
MCHC: 32.6 g/dL (ref 30.0–36.0)
MCV: 92 fl (ref 78.0–100.0)
MONO ABS: 0.4 10*3/uL (ref 0.1–1.0)
Monocytes Relative: 9.1 % (ref 3.0–12.0)
Neutro Abs: 2.1 10*3/uL (ref 1.4–7.7)
Neutrophils Relative %: 50.7 % (ref 43.0–77.0)
Platelets: 306 10*3/uL (ref 150.0–400.0)
RBC: 3.83 Mil/uL — AB (ref 3.87–5.11)
RDW: 14.1 % (ref 11.5–15.5)
WBC: 4.2 10*3/uL (ref 4.0–10.5)

## 2017-06-20 LAB — IBC PANEL
Iron: 74 ug/dL (ref 42–145)
SATURATION RATIOS: 17.5 % — AB (ref 20.0–50.0)
Transferrin: 302 mg/dL (ref 212.0–360.0)

## 2017-06-20 LAB — FERRITIN: FERRITIN: 6.8 ng/mL — AB (ref 10.0–291.0)

## 2017-06-20 LAB — VITAMIN B12: Vitamin B-12: 188 pg/mL — ABNORMAL LOW (ref 211–911)

## 2017-06-21 ENCOUNTER — Other Ambulatory Visit: Payer: Self-pay | Admitting: Internal Medicine

## 2017-06-21 DIAGNOSIS — D649 Anemia, unspecified: Secondary | ICD-10-CM

## 2017-06-21 NOTE — Progress Notes (Signed)
Orders placed for f/u cbc and ferritin.   

## 2017-06-22 ENCOUNTER — Other Ambulatory Visit: Payer: Self-pay | Admitting: Internal Medicine

## 2017-06-22 DIAGNOSIS — D509 Iron deficiency anemia, unspecified: Secondary | ICD-10-CM

## 2017-06-22 NOTE — Progress Notes (Signed)
Order placed for GI referral.   

## 2017-06-24 DIAGNOSIS — M5416 Radiculopathy, lumbar region: Secondary | ICD-10-CM | POA: Diagnosis not present

## 2017-06-24 DIAGNOSIS — M5136 Other intervertebral disc degeneration, lumbar region: Secondary | ICD-10-CM | POA: Diagnosis not present

## 2017-07-02 ENCOUNTER — Ambulatory Visit (INDEPENDENT_AMBULATORY_CARE_PROVIDER_SITE_OTHER): Payer: Medicare Other

## 2017-07-02 DIAGNOSIS — E538 Deficiency of other specified B group vitamins: Secondary | ICD-10-CM | POA: Diagnosis not present

## 2017-07-02 MED ORDER — CYANOCOBALAMIN 1000 MCG/ML IJ SOLN
1000.0000 ug | Freq: Once | INTRAMUSCULAR | Status: AC
  Administered 2017-07-02: 1000 ug via INTRAMUSCULAR

## 2017-07-02 NOTE — Progress Notes (Signed)
Patient comes in for 1 st weekly  B 12 injection .  Injected right deltoid.  Patient tolerated injection well.     Reviewed.  Dr Nicki Reaper

## 2017-07-03 DIAGNOSIS — Z86010 Personal history of colonic polyps: Secondary | ICD-10-CM | POA: Diagnosis not present

## 2017-07-03 DIAGNOSIS — D509 Iron deficiency anemia, unspecified: Secondary | ICD-10-CM | POA: Diagnosis not present

## 2017-07-03 DIAGNOSIS — M5489 Other dorsalgia: Secondary | ICD-10-CM | POA: Diagnosis not present

## 2017-07-03 DIAGNOSIS — M546 Pain in thoracic spine: Secondary | ICD-10-CM | POA: Diagnosis not present

## 2017-07-09 ENCOUNTER — Ambulatory Visit: Payer: Medicare Other

## 2017-07-12 ENCOUNTER — Ambulatory Visit (INDEPENDENT_AMBULATORY_CARE_PROVIDER_SITE_OTHER): Payer: Medicare Other | Admitting: *Deleted

## 2017-07-12 DIAGNOSIS — E538 Deficiency of other specified B group vitamins: Secondary | ICD-10-CM | POA: Diagnosis not present

## 2017-07-12 MED ORDER — CYANOCOBALAMIN 1000 MCG/ML IJ SOLN
1000.0000 ug | Freq: Once | INTRAMUSCULAR | Status: AC
  Administered 2017-07-12: 1000 ug via INTRAMUSCULAR

## 2017-07-12 NOTE — Progress Notes (Addendum)
Patient presented for B 12 injection to left deltoid, patient voiced no concerns nor showed any signs of distress during injection.  Reviewed.  Dr Scott 

## 2017-07-15 ENCOUNTER — Encounter: Payer: Self-pay | Admitting: Internal Medicine

## 2017-07-15 DIAGNOSIS — K3189 Other diseases of stomach and duodenum: Secondary | ICD-10-CM | POA: Diagnosis not present

## 2017-07-15 DIAGNOSIS — K449 Diaphragmatic hernia without obstruction or gangrene: Secondary | ICD-10-CM | POA: Diagnosis not present

## 2017-07-15 DIAGNOSIS — D509 Iron deficiency anemia, unspecified: Secondary | ICD-10-CM | POA: Diagnosis not present

## 2017-07-15 DIAGNOSIS — K2960 Other gastritis without bleeding: Secondary | ICD-10-CM | POA: Diagnosis not present

## 2017-07-15 DIAGNOSIS — R1012 Left upper quadrant pain: Secondary | ICD-10-CM | POA: Diagnosis not present

## 2017-07-15 DIAGNOSIS — K2950 Unspecified chronic gastritis without bleeding: Secondary | ICD-10-CM | POA: Diagnosis not present

## 2017-07-15 DIAGNOSIS — Z86010 Personal history of colonic polyps: Secondary | ICD-10-CM | POA: Diagnosis not present

## 2017-07-15 DIAGNOSIS — D131 Benign neoplasm of stomach: Secondary | ICD-10-CM | POA: Diagnosis not present

## 2017-07-15 LAB — HM COLONOSCOPY

## 2017-07-19 ENCOUNTER — Ambulatory Visit (INDEPENDENT_AMBULATORY_CARE_PROVIDER_SITE_OTHER): Payer: Medicare Other

## 2017-07-19 DIAGNOSIS — E538 Deficiency of other specified B group vitamins: Secondary | ICD-10-CM

## 2017-07-19 MED ORDER — CYANOCOBALAMIN 1000 MCG/ML IJ SOLN
1000.0000 ug | Freq: Once | INTRAMUSCULAR | Status: AC
  Administered 2017-07-19: 1000 ug via INTRAMUSCULAR

## 2017-07-19 NOTE — Progress Notes (Signed)
Patient came inton clinic for her 3 out of 4 B-12 shot. Patient tolerated injection well. Patient has no questions, comments, or concerns at this time.

## 2017-07-24 ENCOUNTER — Encounter: Payer: Self-pay | Admitting: Internal Medicine

## 2017-07-24 ENCOUNTER — Encounter: Payer: Self-pay | Admitting: *Deleted

## 2017-07-24 ENCOUNTER — Ambulatory Visit (INDEPENDENT_AMBULATORY_CARE_PROVIDER_SITE_OTHER): Payer: Medicare Other

## 2017-07-24 ENCOUNTER — Telehealth: Payer: Self-pay | Admitting: Internal Medicine

## 2017-07-24 ENCOUNTER — Ambulatory Visit
Admission: EM | Admit: 2017-07-24 | Discharge: 2017-07-24 | Disposition: A | Discharge status: Home / Self Care | Payer: Medicare Other | Attending: Family Medicine | Admitting: Family Medicine

## 2017-07-24 DIAGNOSIS — Z7982 Long term (current) use of aspirin: Secondary | ICD-10-CM | POA: Insufficient documentation

## 2017-07-24 DIAGNOSIS — R2242 Localized swelling, mass and lump, left lower limb: Secondary | ICD-10-CM

## 2017-07-24 DIAGNOSIS — R600 Localized edema: Secondary | ICD-10-CM

## 2017-07-24 DIAGNOSIS — R0602 Shortness of breath: Secondary | ICD-10-CM | POA: Diagnosis not present

## 2017-07-24 DIAGNOSIS — Z79899 Other long term (current) drug therapy: Secondary | ICD-10-CM | POA: Diagnosis not present

## 2017-07-24 DIAGNOSIS — M7989 Other specified soft tissue disorders: Secondary | ICD-10-CM | POA: Diagnosis present

## 2017-07-24 DIAGNOSIS — Z79891 Long term (current) use of opiate analgesic: Secondary | ICD-10-CM | POA: Diagnosis not present

## 2017-07-24 DIAGNOSIS — R0600 Dyspnea, unspecified: Secondary | ICD-10-CM

## 2017-07-24 DIAGNOSIS — Z7984 Long term (current) use of oral hypoglycemic drugs: Secondary | ICD-10-CM | POA: Insufficient documentation

## 2017-07-24 DIAGNOSIS — R2241 Localized swelling, mass and lump, right lower limb: Secondary | ICD-10-CM | POA: Diagnosis not present

## 2017-07-24 LAB — BASIC METABOLIC PANEL
ANION GAP: 8 (ref 5–15)
BUN: 11 mg/dL (ref 6–20)
CHLORIDE: 106 mmol/L (ref 101–111)
CO2: 25 mmol/L (ref 22–32)
Calcium: 9.2 mg/dL (ref 8.9–10.3)
Creatinine, Ser: 0.71 mg/dL (ref 0.44–1.00)
GFR calc Af Amer: >60 mL/min (ref >60)
GLUCOSE: 106 mg/dL — AB (ref 65–99)
POTASSIUM: 3.8 mmol/L (ref 3.5–5.1)
Sodium: 139 mmol/L (ref 135–145)

## 2017-07-24 LAB — BRAIN NATRIURETIC PEPTIDE: B Natriuretic Peptide: 122 pg/mL — ABNORMAL HIGH (ref 0.0–100.0)

## 2017-07-24 LAB — TROPONIN I: Troponin I: <0.03 ng/mL (ref <0.03)

## 2017-07-24 MED ORDER — FUROSEMIDE 40 MG PO TABS
40.0000 mg | ORAL_TABLET | Freq: Once | ORAL | Status: AC
  Administered 2017-07-24: 40 mg via ORAL

## 2017-07-24 MED ORDER — FUROSEMIDE 20 MG PO TABS: 20.0000 mg | ORAL_TABLET | Freq: Every day | ORAL | 0 refills | Status: DC

## 2017-07-24 NOTE — ED Triage Notes (Signed)
Patient started having swelling in her ankles bilateral and weight gain 3 days ago. SOB symptoms started 2 days ago. Additional symptom of back pain that radiates to her chest has been occurring for 1 month.

## 2017-07-24 NOTE — Telephone Encounter (Signed)
Advised patient to go to [facility]. Patient agreed to comply

## 2017-07-24 NOTE — Discharge Instructions (Signed)
-  furosemide: one tablet daily. Three days given -contact Dr. Nicki Reaper tonight or tomorrow via [place] and let her know about symptoms and that you were given a few days of Lasix. -low threshold to present to ER should SOB worsen or you develop chest pain or discomfort.

## 2017-07-24 NOTE — ED Provider Notes (Signed)
[facility]    CSN: [id number] Arrival date & time: 07/24/17  1728     History   Chief Complaint Chief Complaint  Patient presents with  . Shortness of Breath  . Leg Swelling    HPI Sherry Simon is a 71 y.o. female.   Patient is a city 84-year-old female with past history as below who presents with chief complaint of ankle and lower leg swelling that Sunday as well as shortness of breath. Patient reports shortness of breath is noted even with sitting or lying down and is worse with activity. She's had not had any similar issues in the past. Patient ports a 7 pound weight gain since the weekend. She had a prompt swelling in the past. Patient also reports pain to her mid upper back between her shoulder blades 1 month that radiates through to the front of her chest. She was seen for this and had a thoracic spine x-ray which reportedly only showed some chronic changes. Patient denies any cough or abdominal pain. She did have a colonoscopy done last week which she says was normal. 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 She does report her shortness of breath is worse lying flat.   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 She headache cardiac MRI done at that time. EF of greater than 70% on that exam with some basal septal hypertrophy and mild mitral regurg. Everything else was in normal limits. Patient denies any recent palpitations but has had them in the past. Patient does have a history of diabetes, hypertension, and hyperlipidemia is currently on a statin and daily 81 mg aspirin. Pt is on losartan for her HTN.      Past Medical History:  Diagnosis Date  . Anxiety and depression   . Colon polyps   . Depression   . Diabetes mellitus ([place])   . Endometriosis    requiring hysterectomy  . History of chicken pox   . Hypercholesterolemia   . Hypertension   . Nephrolithiasis   .  Pericarditis    recurrent, unkown origin  . Tachycardia     Patient Active Problem List   Diagnosis Date Noted  . B12 deficiency 07/02/2017  . 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 UTI (urinary tract infection) 09/28/2015  . Hot flashes 09/05/2015  . Palpitations 12/23/2014  . Stress 11/07/2014  . Joint stiffness of hand 06/20/2014  . Rotator cuff tear 10/18/2013  . Thyroid nodule 10/18/2013  . History of colonic polyps 07/05/2013  . Breast cancer ([place]) 02/02/2013  . Chronic back pain 08/24/2012  . GERD (gastroesophageal reflux disease) 08/24/2012  . Hypercholesterolemia 08/24/2012  . Diabetes mellitus ([place]) 08/24/2012  . Hypertension 08/24/2012    Past Surgical History:  Procedure Laterality Date  . ABDOMINAL HYSTERECTOMY  1980  . APPENDECTOMY  1055  . BACK SURGERY  1005-2010   laminectomy  . CHOLECYSTECTOMY     open  . OOPHORECTOMY  1982  . TONSILLECTOMY      OB History    No data available       Home Medications    Prior to Admission medications   Medication Sig Start Date End Date Taking? Authorizing Provider  acyclovir (ZOVIRAX) 400 MG tablet Take one tablet daily 06/04/17  Yes Einar Pheasant, MD  aspirin 81 MG tablet Take 81 mg by mouth daily.   Yes [provider]  atorvastatin (LIPITOR) 40 MG tablet Take 1 tablet (40  mg total) by mouth daily. 06/04/17  Yes Einar Pheasant, MD  busPIRone (BUSPAR) 5 MG tablet Take 1 tablet (5 mg total) by mouth daily as needed. 06/05/17  Yes Einar Pheasant, MD  Calcium Carbonate-Vitamin D 600-400 MG-UNIT tablet Take by mouth.   Yes [provider]  citalopram (CELEXA) 40 MG tablet Take 1 tablet (40 mg total) by mouth daily. 06/04/17  Yes Einar Pheasant, MD  ferrous sulfate 325 (65 FE) MG EC tablet Take 325 mg by mouth 3 (three) times daily with meals.   Yes [provider]  gabapentin (NEURONTIN) 600 MG tablet TAKE 2 TABLETS BY MOUTH IN  THE MORNING AND 3 TABLETS  BY MOUTH IN THE EVENING 01/09/17  Yes  Einar Pheasant, MD  latanoprost (XALATAN) 0.005 % ophthalmic solution INSTILL 1 DROP INTO THE  LEFT EYE AT BEDTIME 01/09/17  Yes Einar Pheasant, MD  losartan (COZAAR) 100 MG tablet Take 1 tablet (100 mg total) by mouth daily. 06/04/17  Yes Einar Pheasant, MD  metFORMIN (GLUCOPHAGE) 500 MG tablet TAKE 2 TABLETS BY MOUTH  EVERY MORNING AND TAKE 1  TABLET AT SUPPER 06/04/17  Yes Einar Pheasant, MD  oxyCODONE (OXY IR/ROXICODONE) 5 MG immediate release tablet May take up 3 tablets at a time every 6 hours 04/30/14  Yes [provider]  pantoprazole (PROTONIX) 40 MG tablet Take 1 tablet (40 mg total) by mouth 2 (two) times daily. 06/04/17  Yes Einar Pheasant, MD  furosemide (LASIX) 20 MG tablet Take 1 tablet (20 mg total) by mouth daily. 07/24/17 07/27/17  Luvenia Redden, PA-C  glucose blood (ONE TOUCH ULTRA TEST) test strip TEST BLOOD SUGAR TWICE A DAY 01/05/17   Einar Pheasant, MD    Family History Family History  Problem Relation Age of Onset  . Heart disease Father        myocardial infarction - died 46  . Thyroid disease Mother   . Transient ischemic attack Mother        multiple  . Breast cancer Mother   . Hyperlipidemia Mother   . Kidney disease Mother   . Diabetes Mother   . Rheumatic fever Sister        mitral valve problems  . Breast cancer Paternal Aunt   . Other Sister        Small vessel disease  . Colon cancer Neg Hx     Social History Social History  Substance Use Topics  . Smoking status: Never Smoker  . Smokeless tobacco: Never Used  . Alcohol use No     Allergies   Ivp dye [iodinated diagnostic agents]   Review of Systems Review of Systems   Physical Exam Triage Vital Signs ED Triage Vitals  Enc Vitals Group     BP 07/24/17 1738 140/65     Pulse Rate 07/24/17 1738 67     Resp 07/24/17 1738 16     Temp 07/24/17 1738 98.4 F (36.9 C)     Temp src --      SpO2 07/24/17 1738 100 %     Weight 07/24/17 1739 167 lb (75.8 kg)     Height 07/24/17  1739 5\' 4"  (1.626 m)     Head Circumference --      Peak Flow --      Pain Score 07/24/17 1741 2     Pain Loc --      Pain Edu? --      Excl. in [place]? --    No data found.  Updated Vital Signs BP 140/65 (BP Location: Left Arm)   Pulse 67   Temp 98.4 F (36.9 C)   Resp 16   Ht 5\' 4"  (1.626 m)   Wt 167 lb (75.8 kg)   SpO2 100%   BMI 28.67 kg/m   Physical Exam  Constitutional: She is oriented to person, place, and time. She appears well-developed and well-nourished. No distress.  HENT:  Head: Normocephalic and atraumatic.  Eyes: Pupils are equal, round, and reactive to light. EOM are normal.  Neck: Normal range of motion. Neck supple.  Cardiovascular: Normal rate, regular rhythm and normal heart sounds.  Exam reveals no friction rub.   No murmur heard. Pulmonary/Chest: Effort normal and breath sounds normal. No respiratory distress. She has no wheezes.  Abdominal: Soft.  Musculoskeletal: Normal range of motion. She exhibits edema (1+ edema to BLE up to knee).  Neurological: She is alert and oriented to person, place, and time. No cranial nerve deficit.  Skin: Skin is warm and dry. Capillary refill takes less than 2 seconds.  Psychiatric: She has a normal mood and affect. Her behavior is normal.     UC Treatments / Results  Labs (all labs ordered are listed, but only abnormal results are displayed) Labs Reviewed  BASIC METABOLIC PANEL - Abnormal; Notable for the following:       Result Value   Glucose, Bld 106 (*)    All other components within normal limits  TROPONIN I  BRAIN NATRIURETIC PEPTIDE    EKG ED ECG REPORT  Date: 07/24/2017  EKG Time: 5:49 PM  Rate: 67  Rhythm: normal sinus rhythm,  normal EKG, normal sinus rhythm, unchanged from previous tracings  Intervals:none. QTc 443 ms  ST&T Change: None  Narrative Interpretation: normal ECG   Radiology No results found.  Procedures Procedures (including critical care time)  Medications Ordered in  UC Medications  furosemide (LASIX) tablet 40 mg (40 mg Oral Given 07/24/17 1824)     Initial Impression / Assessment and Plan / UC Course  I have reviewed the triage vital signs and the nursing notes.  Pertinent labs & imaging results that were available during my care of the patient were reviewed by me and considered in my medical decision making (see chart for details).    Patient with bilateral lower extremity edema, shortness of breath, 7 pound weight gain since this weekend. Chronic MRI in June 2016 with normal function. Will check BMP and troponin. A BNP is not available at this facility.  Final Clinical Impressions(s) / UC Diagnoses   Final diagnoses:  Shortness of breath  Bilateral lower extremity edema  Acute dyspnea   BMP with normal renal function and troponin negative. Chest x-ray with some fluid noted in the bilateral fissures and some diffuse markings which could represent edema. Official read is pending. We'll go ahead and give patient a dose of Lasix 40 mg here in the clinic and given a prescription for 3 days of Lasix 20 mg by mouth daily. Ask her to contact her PCP tonight or in the morning via my chart to give her information regarding her symptoms that she has been given a few days of Lasix. Differential includes congestive heart failure, pneumonia. Patient without fever. Edema and shortness of breath leads to congestive heart failure but requires further workup.  New Prescriptions New Prescriptions   FUROSEMIDE (LASIX) 20 MG TABLET    Take 1 tablet (20 mg total) by mouth daily.     Controlled Substance Prescriptions  [place] Controlled Substance Registry consulted? Not Applicable   Luvenia Redden, PA-C 07/24/17 6629

## 2017-07-24 NOTE — Telephone Encounter (Signed)
Pt states she does not want to go to ED because there is a $500 co-pay. Pt is retaining water and shortness of breath for 3 days. Please call her at [phone].

## 2017-07-25 ENCOUNTER — Ambulatory Visit: Payer: Medicare Other

## 2017-07-25 NOTE — Telephone Encounter (Signed)
I do not have an opening tomorrow, but I can see her Monday during lunch.  Have her come at 12:30.

## 2017-07-29 ENCOUNTER — Ambulatory Visit (INDEPENDENT_AMBULATORY_CARE_PROVIDER_SITE_OTHER): Payer: Medicare Other | Admitting: Internal Medicine

## 2017-07-29 ENCOUNTER — Encounter: Payer: Self-pay | Admitting: Internal Medicine

## 2017-07-29 DIAGNOSIS — M546 Pain in thoracic spine: Secondary | ICD-10-CM

## 2017-07-29 DIAGNOSIS — R0789 Other chest pain: Secondary | ICD-10-CM

## 2017-07-29 DIAGNOSIS — E119 Type 2 diabetes mellitus without complications: Secondary | ICD-10-CM

## 2017-07-29 DIAGNOSIS — D649 Anemia, unspecified: Secondary | ICD-10-CM | POA: Diagnosis not present

## 2017-07-29 DIAGNOSIS — I10 Essential (primary) hypertension: Secondary | ICD-10-CM | POA: Diagnosis not present

## 2017-07-29 DIAGNOSIS — K219 Gastro-esophageal reflux disease without esophagitis: Secondary | ICD-10-CM

## 2017-07-29 DIAGNOSIS — R0602 Shortness of breath: Secondary | ICD-10-CM

## 2017-07-29 DIAGNOSIS — M549 Dorsalgia, unspecified: Secondary | ICD-10-CM | POA: Insufficient documentation

## 2017-07-29 NOTE — Progress Notes (Signed)
Patient ID: JESYKA SLAGHT, female   DOB: 1946/05/30, 71 y.o.   MRN: [id number]   Subjective:    Patient ID: AMAL RENBARGER, female    DOB: [birth year], 71 y.o.   MRN: [id number]  HPI  Patient here for urgent care follow up.  She reports that approximately 10 days ago, she noticed some sob.  Also noticed weight gain.  Went to urgent care 07/24/17. Note reviewed.  Noted some minimal ankle swelling.  cxr - negative.  Was given 40mg  of lasix x 1 and placed on lasix 20mg  q day for three days.  She reports that her breathing is some better.  Still with some sob.  No cough.  No chest congestion.  No sinus pressure or headache.  She does report some mid back pain that was shooting through to her left breast/chest.  States now the pain radiates around her left side to her left breast.  No pain to palpation ov the breast.  Flares at times.  No known triggers.  Does not appear to worsen with movement or activity.  Some nausea.  No vomiting.  No bowel change.  No fever.     Past Medical History:  Diagnosis Date  . Anxiety and depression   . Colon polyps   . Depression   . Diabetes mellitus ([place])   . Endometriosis    requiring hysterectomy  . History of chicken pox   . Hypercholesterolemia   . Hypertension   . Nephrolithiasis   . Pericarditis    recurrent, unkown origin  . Tachycardia    Past Surgical History:  Procedure Laterality Date  . ABDOMINAL HYSTERECTOMY  1980  . APPENDECTOMY  1055  . BACK SURGERY  1005-2010   laminectomy  . CHOLECYSTECTOMY     open  . OOPHORECTOMY  1982  . TONSILLECTOMY     Family History  Problem Relation Age of Onset  . Heart disease Father        myocardial infarction - died 7  . Thyroid disease Mother   . Transient ischemic attack Mother        multiple  . Breast cancer Mother   . Hyperlipidemia Mother   . Kidney disease Mother   . Diabetes Mother   . Rheumatic fever Sister        mitral valve problems  . Breast cancer Paternal Aunt   . Other  Sister        Small vessel disease  . Colon cancer Neg Hx    Social History   Socioeconomic History  . Marital status: Married    Spouse name: None  . Number of children: 3  . Years of education: None  . Highest education level: None  Social Needs  . Financial resource strain: None  . Food insecurity - worry: None  . Food insecurity - inability: None  . Transportation needs - medical: None  . Transportation needs - non-medical: None  Occupational History  . None  Tobacco Use  . Smoking status: Never Smoker  . Smokeless tobacco: Never Used  Substance and Sexual Activity  . Alcohol use: No    Alcohol/week: 0.0 oz  . Drug use: No  . Sexual activity: No  Other Topics Concern  . None  Social History Narrative  . None    Outpatient Encounter Medications as of 07/29/2017  Medication Sig  . acyclovir (ZOVIRAX) 400 MG tablet Take one tablet daily  . aspirin 81 MG tablet Take 81 mg by mouth daily.  Marland Kitchen  atorvastatin (LIPITOR) 40 MG tablet Take 1 tablet (40 mg total) by mouth daily.  . busPIRone (BUSPAR) 5 MG tablet Take 1 tablet (5 mg total) by mouth daily as needed.  . Calcium Carbonate-Vitamin D 600-400 MG-UNIT tablet Take by mouth.  . citalopram (CELEXA) 40 MG tablet Take 1 tablet (40 mg total) by mouth daily.  . ferrous sulfate 325 (65 FE) MG EC tablet Take 325 mg by mouth 3 (three) times daily with meals.  . gabapentin (NEURONTIN) 600 MG tablet TAKE 2 TABLETS BY MOUTH IN  THE MORNING AND 3 TABLETS  BY MOUTH IN THE EVENING  . glucose blood (ONE TOUCH ULTRA TEST) test strip TEST BLOOD SUGAR TWICE A DAY  . latanoprost (XALATAN) 0.005 % ophthalmic solution INSTILL 1 DROP INTO THE  LEFT EYE AT BEDTIME  . losartan (COZAAR) 100 MG tablet Take 1 tablet (100 mg total) by mouth daily.  . metFORMIN (GLUCOPHAGE) 500 MG tablet TAKE 2 TABLETS BY MOUTH  EVERY MORNING AND TAKE 1  TABLET AT SUPPER  . oxyCODONE (OXY IR/ROXICODONE) 5 MG immediate release tablet May take up 3 tablets at a time  every 6 hours  . pantoprazole (PROTONIX) 40 MG tablet Take 1 tablet (40 mg total) by mouth 2 (two) times daily.  . [DISCONTINUED] furosemide (LASIX) 20 MG tablet Take 1 tablet (20 mg total) by mouth daily.   No facility-administered encounter medications on file as of 07/29/2017.     Review of Systems  Constitutional: Negative for appetite change.       She had reported noticing some weight gain.  Improved now.    HENT: Negative for congestion and sinus pressure.   Respiratory: Positive for shortness of breath. Negative for cough, chest tightness and wheezing.   Cardiovascular: Positive for chest pain and leg swelling. Negative for palpitations.  Gastrointestinal: Positive for nausea. Negative for abdominal pain, diarrhea and vomiting.  Genitourinary: Negative for difficulty urinating and dysuria.  Musculoskeletal: Positive for back pain. Negative for joint swelling.  Skin: Negative for color change and rash.  Neurological: Negative for dizziness, light-headedness and headaches.  Psychiatric/Behavioral: Negative for agitation and dysphoric mood.       Objective:    Physical Exam  Constitutional: She appears well-developed and well-nourished. No distress.  HENT:  Nose: Nose normal.  Mouth/Throat: Oropharynx is clear and moist.  Neck: Neck supple. No thyromegaly present.  Cardiovascular: Normal rate and regular rhythm.  Pulmonary/Chest: Breath sounds normal. No respiratory distress. She has no wheezes.  No pain with deep breathing.   Breast exam - left breast - no palpable nodule.  No pain to palpation.     Abdominal: Soft. Bowel sounds are normal. There is no tenderness.  Musculoskeletal: She exhibits no edema or tenderness.  No pain to palpation over the back.    Lymphadenopathy:    She has no cervical adenopathy.  Skin: No rash noted. No erythema.  Psychiatric: She has a normal mood and affect. Her behavior is normal.    BP 136/66   Pulse 79   Temp 97.6 F (36.4 C)  (Oral)   Resp 14   Wt 163 lb 6.4 oz (74.1 kg)   SpO2 97%   BMI 28.05 kg/m  Wt Readings from Last 3 Encounters:  07/29/17 163 lb 6.4 oz (74.1 kg)  07/24/17 167 lb (75.8 kg)  06/04/17 164 lb 9.6 oz (74.7 kg)     Lab Results  Component Value Date   WBC 4.2 06/20/2017   HGB 11.5 (L)  06/20/2017   HCT 35.2 (L) 06/20/2017   PLT 306.0 06/20/2017   GLUCOSE 106 (H) 07/24/2017   CHOL 163 06/04/2017   TRIG 91.0 06/04/2017   HDL 56.30 06/04/2017   LDLCALC 89 06/04/2017   ALT 14 06/04/2017   AST 14 06/04/2017   NA 139 07/24/2017   K 3.8 07/24/2017   CL 106 07/24/2017   CREATININE 0.71 07/24/2017   BUN 11 07/24/2017   CO2 25 07/24/2017   TSH 1.79 06/04/2017   HGBA1C 6.4 06/04/2017   MICROALBUR <0.7 06/04/2017    Dg Chest 2 View  Result Date: 07/24/2017 CLINICAL DATA:  Shortness of breath with swelling in the legs. EXAM: CHEST  2 VIEW COMPARISON:  04/03/2016 FINDINGS: Heart size is normal. Mediastinal shadows are normal. The lungs are clear. The vascularity is normal. No effusions. Surgical clips in the right breast. Ordinary degenerative changes affect the spine. IMPRESSION: No active cardiopulmonary disease. Electronically Signed   By: Nelson Chimes M.D.   On: 07/24/2017 18:39       Assessment & Plan:   Problem List Items Addressed This Visit    Anemia    Recent slight decreased in hgb. Receiving B12 injections.  Recheck cbc today.  Had recent EGD and colonoscopy.        Relevant Orders   CBC with Differential/Platelet   Ferritin   Back pain    With increased back pain as outlined.  Unclear etiology.  Has been present for several weeks.  No known triggers.  Not worsened by movement or activity.  Check CT chest as outlined.  Wanted to obtain CT abdomen as well, but reports allergy to IV contrast dye and states also had allergic reaction to something she drank previously (with a radiology test).  The reaction occurred even with pre meds.  She declined oral contrast.  Will start  with CT chest.        Relevant Orders   Sedimentation rate   CT Chest Wo Contrast   CT Abdomen Pelvis Wo Contrast   Diabetes mellitus ([place])    Sugars have been under good control.  Low carb diet.  Follow.        GERD (gastroesophageal reflux disease)    Controlled on protonix.        Hypertension    Blood pressure under good control.  Continue same medication regimen.  Follow pressures.  Follow metabolic panel.        SOB (shortness of breath)    Recently evaluated at urgent care for sob.  Here for f/u.  Breathing is better.  EKG - SR with no acute ischemic change.  No significant change when compared to previous EKG.  Given sob and discomfort, will have cardiology reevaluate with question of need for further cardiac w/up.  Recent CXR negative.  Given sob and pain, will obtain CT chest.  She has had allergic reaction to IV contrast dye (even with pre meds).        Relevant Orders   Ambulatory referral to Cardiology   CT Chest Wo Contrast    Other Visit Diagnoses    Other chest pain       Relevant Orders   CT Chest Wo Contrast      I spent 40 minutes with the patient and more than 50% of the time was spent in consultation regarding the above.  Time spent discussed her recent urgent care visit and symptoms.  Time also spent discussing treatment and plan for further w/up.  Einar Pheasant, MD

## 2017-07-30 ENCOUNTER — Encounter: Payer: Self-pay | Admitting: Internal Medicine

## 2017-07-30 DIAGNOSIS — I10 Essential (primary) hypertension: Secondary | ICD-10-CM | POA: Diagnosis not present

## 2017-07-30 DIAGNOSIS — R0602 Shortness of breath: Secondary | ICD-10-CM | POA: Diagnosis not present

## 2017-07-30 DIAGNOSIS — E782 Mixed hyperlipidemia: Secondary | ICD-10-CM | POA: Diagnosis not present

## 2017-07-30 DIAGNOSIS — I5021 Acute systolic (congestive) heart failure: Secondary | ICD-10-CM | POA: Diagnosis not present

## 2017-07-30 DIAGNOSIS — D649 Anemia, unspecified: Secondary | ICD-10-CM | POA: Diagnosis not present

## 2017-07-30 LAB — CBC WITH DIFFERENTIAL/PLATELET
Basophils Absolute: 0 10*3/uL (ref 0.0–0.1)
Basophils Relative: 0.9 % (ref 0.0–3.0)
EOS PCT: 2 % (ref 0.0–5.0)
Eosinophils Absolute: 0.1 10*3/uL (ref 0.0–0.7)
HEMATOCRIT: 35.2 % — AB (ref 36.0–46.0)
HEMOGLOBIN: 11.6 g/dL — AB (ref 12.0–15.0)
LYMPHS ABS: 2 10*3/uL (ref 0.7–4.0)
LYMPHS PCT: 39.9 % (ref 12.0–46.0)
MCHC: 32.9 g/dL (ref 30.0–36.0)
MCV: 92.8 fl (ref 78.0–100.0)
MONOS PCT: 9.7 % (ref 3.0–12.0)
Monocytes Absolute: 0.5 10*3/uL (ref 0.1–1.0)
Neutro Abs: 2.3 10*3/uL (ref 1.4–7.7)
Neutrophils Relative %: 47.5 % (ref 43.0–77.0)
Platelets: 328 10*3/uL (ref 150.0–400.0)
RBC: 3.8 Mil/uL — AB (ref 3.87–5.11)
RDW: 14.2 % (ref 11.5–15.5)
WBC: 4.9 10*3/uL (ref 4.0–10.5)

## 2017-07-30 LAB — SEDIMENTATION RATE: SED RATE: 4 mm/h (ref 0–30)

## 2017-07-30 LAB — FERRITIN: Ferritin: 9.9 ng/mL — ABNORMAL LOW (ref 10.0–291.0)

## 2017-07-30 MED ORDER — CYANOCOBALAMIN 1000 MCG/ML IJ SOLN
1000.0000 ug | Freq: Once | INTRAMUSCULAR | Status: AC
  Administered 2017-07-30: 1000 ug via INTRAMUSCULAR

## 2017-07-30 NOTE — Assessment & Plan Note (Signed)
Recently evaluated at urgent care for sob.  Here for f/u.  Breathing is better.  EKG - SR with no acute ischemic change.  No significant change when compared to previous EKG.  Given sob and discomfort, will have cardiology reevaluate with question of need for further cardiac w/up.  Recent CXR negative.  Given sob and pain, will obtain CT chest.  She has had allergic reaction to IV contrast dye (even with pre meds).

## 2017-07-30 NOTE — Assessment & Plan Note (Signed)
With increased back pain as outlined.  Unclear etiology.  Has been present for several weeks.  No known triggers.  Not worsened by movement or activity.  Check CT chest as outlined.  Wanted to obtain CT abdomen as well, but reports allergy to IV contrast dye and states also had allergic reaction to something she drank previously (with a radiology test).  The reaction occurred even with pre meds.  She declined oral contrast.  Will start with CT chest.

## 2017-07-30 NOTE — Assessment & Plan Note (Signed)
Recent slight decreased in hgb. Receiving B12 injections.  Recheck cbc today.  Had recent EGD and colonoscopy.

## 2017-07-30 NOTE — Assessment & Plan Note (Signed)
Blood pressure under good control.  Continue same medication regimen.  Follow pressures.  Follow metabolic panel.   

## 2017-07-30 NOTE — Addendum Note (Signed)
Addended by: Francella Solian on: 07/30/2017 08:02 AM   Modules accepted: Orders

## 2017-07-30 NOTE — Assessment & Plan Note (Signed)
Sugars have been under good control.  Low carb diet.  Follow.

## 2017-07-30 NOTE — Assessment & Plan Note (Signed)
Controlled on protonix.   

## 2017-07-31 ENCOUNTER — Ambulatory Visit
Admission: RE | Admit: 2017-07-31 | Discharge: 2017-07-31 | Disposition: A | Discharge status: Home / Self Care | Payer: Medicare Other | Source: Ambulatory Visit | Attending: Internal Medicine | Admitting: Internal Medicine

## 2017-07-31 ENCOUNTER — Ambulatory Visit: Admission: RE | Admit: 2017-07-31 | Payer: Medicare Other | Source: Ambulatory Visit

## 2017-07-31 DIAGNOSIS — R0789 Other chest pain: Secondary | ICD-10-CM | POA: Diagnosis not present

## 2017-07-31 DIAGNOSIS — R911 Solitary pulmonary nodule: Secondary | ICD-10-CM | POA: Insufficient documentation

## 2017-07-31 DIAGNOSIS — R0602 Shortness of breath: Secondary | ICD-10-CM | POA: Diagnosis not present

## 2017-07-31 DIAGNOSIS — M546 Pain in thoracic spine: Secondary | ICD-10-CM | POA: Insufficient documentation

## 2017-08-02 ENCOUNTER — Ambulatory Visit: Payer: Self-pay | Admitting: *Deleted

## 2017-08-02 ENCOUNTER — Telehealth: Payer: Self-pay

## 2017-08-02 NOTE — Telephone Encounter (Signed)
Pt called to check the status of CT results... Is hoping to have call back prior to Monday

## 2017-08-02 NOTE — Telephone Encounter (Signed)
pT  REPORTS  PAIN  R  SIDE  OF  CHEST   WHEN  SHE  MOVES   ANTURNS   SHE  REPORTS   PAIN RADIATES  TO  R  SIDE  OF  HER  BACK  SHE  WANTS  HER  CT  SCAN  RESULTS   [facility]   AT   DR    [facility]     OFFICE   AND   RESULTS   HAVE not been released  Yet  And  They  Will call her on next Monday or Tuesday    Reason for Disposition . [1] MODERATE back pain (e.g., interferes with normal activities) AND [2] present > 3 days . [1] SEVERE back pain (e.g., excruciating, unable to do any normal activities) AND [2] not improved 2 hours after pain medicine  Answer Assessment - Initial Assessment Questions 1. ONSET: "When did the pain begin?"       6   WEEKS   AGO    2. LOCATION: "Where does it hurt?" (upper, mid or lower back)     L  SIDE  BACK   RADIATING  TO  L   SIDE  OF  CHEST     3. SEVERITY: "How bad is the pain?"  (e.g., Scale 1-10; mild, moderate, or severe)   - MILD (1-3): doesn't interfere with normal activities    - MODERATE (4-7): interferes with normal activities or awakens from sleep    - SEVERE (8-10): excruciating pain, unable to do any normal activities       4    [place]   4. PATTERN: "Is the pain constant?" (e.g., yes, no; constant, intermittent)        CONSTANT      5. RADIATION: "Does the pain shoot into your legs or elsewhere?"       MAINLY  IN  UPPER   TORSO 6. CAUSE:  "What do you think is causing the back pain?"        UNSURE    7. BACK OVERUSE:  "Any recent lifting of heavy objects, strenuous work or exercise?"     NONE    8. MEDICATIONS: "What have you taken so far for the pain?" (e.g., nothing, acetaminophen, NSAIDS)     HYDROCODONE    9. NEUROLOGIC SYMPTOMS: "Do you have any weakness, numbness, or problems with bowel/bladder control?"      NONE   10. OTHER SYMPTOMS: "Do you have any other symptoms?" (e.g., fever, abdominal pain, burning with urination, blood in urine)       SHORTNESS  OF  BREATH   11. PREGNANCY: "Is there any chance you are  pregnant?" (e.g., yes, no; LMP)       NONE  Protocols used: BACK PAIN-A-AH

## 2017-08-02 NOTE — Telephone Encounter (Signed)
Copied from [place] [id number]. Topic: Inquiry >> Aug 02, 2017  9:46 AM Conception Chancy, NT wrote: Reason for CRM: pt is calling wondering if her results from her CT scan have came back and if so could she go ahead and get them. Call pt back when they're available.

## 2017-08-02 NOTE — Telephone Encounter (Signed)
Do we have results yet?

## 2017-08-06 ENCOUNTER — Other Ambulatory Visit: Payer: Medicare Other

## 2017-08-06 ENCOUNTER — Telehealth: Payer: Self-pay | Admitting: Internal Medicine

## 2017-08-06 DIAGNOSIS — Z170 Estrogen receptor positive status [ER+]: Secondary | ICD-10-CM | POA: Diagnosis not present

## 2017-08-06 DIAGNOSIS — M810 Age-related osteoporosis without current pathological fracture: Secondary | ICD-10-CM | POA: Diagnosis not present

## 2017-08-06 DIAGNOSIS — C50911 Malignant neoplasm of unspecified site of right female breast: Secondary | ICD-10-CM | POA: Diagnosis not present

## 2017-08-06 NOTE — Telephone Encounter (Signed)
Patient notified of CT report and voiced understanding per PCP verbal for nurse to give results, small rounded nodule in lower left lobe with no effusion and 6 to 12 month CT recommended for follow up. Advised patient we can have nodules and they are common that we just need to monitor. Patient was very caLM calm and understanding.

## 2017-08-06 NOTE — Telephone Encounter (Signed)
See previous phone note patient notified and voiced understanding.

## 2017-08-06 NOTE — Telephone Encounter (Signed)
Please notify pt that her CT chest reveals a tiny (76mm nodule).  Unknown significance.  Could be previous infection, scarring, etc.  Will need to follow.  What I would like to do is get another physician to review (who looks at a lot of my scans and get his opinion of f/u).  If she is agreeable, let me know and I will send the ct for review.

## 2017-08-06 NOTE — Telephone Encounter (Signed)
Pt states Dr Nicki Reaper called her directly to advise on CT results. Pt states she need a call back before 10:30 oncology appt

## 2017-08-09 ENCOUNTER — Other Ambulatory Visit: Payer: Self-pay

## 2017-08-09 ENCOUNTER — Ambulatory Visit (INDEPENDENT_AMBULATORY_CARE_PROVIDER_SITE_OTHER): Payer: Medicare Other | Admitting: Internal Medicine

## 2017-08-09 ENCOUNTER — Encounter: Payer: Self-pay | Admitting: Internal Medicine

## 2017-08-09 DIAGNOSIS — K219 Gastro-esophageal reflux disease without esophagitis: Secondary | ICD-10-CM

## 2017-08-09 DIAGNOSIS — M546 Pain in thoracic spine: Secondary | ICD-10-CM | POA: Diagnosis not present

## 2017-08-09 DIAGNOSIS — D649 Anemia, unspecified: Secondary | ICD-10-CM

## 2017-08-09 DIAGNOSIS — E119 Type 2 diabetes mellitus without complications: Secondary | ICD-10-CM

## 2017-08-09 DIAGNOSIS — F439 Reaction to severe stress, unspecified: Secondary | ICD-10-CM | POA: Diagnosis not present

## 2017-08-09 DIAGNOSIS — I10 Essential (primary) hypertension: Secondary | ICD-10-CM | POA: Diagnosis not present

## 2017-08-09 DIAGNOSIS — E7800 Pure hypercholesterolemia, unspecified: Secondary | ICD-10-CM

## 2017-08-09 DIAGNOSIS — E538 Deficiency of other specified B group vitamins: Secondary | ICD-10-CM

## 2017-08-09 DIAGNOSIS — C50919 Malignant neoplasm of unspecified site of unspecified female breast: Secondary | ICD-10-CM

## 2017-08-09 NOTE — Progress Notes (Signed)
Patient ID: Sherry Simon, female   DOB: [birth year], 71 y.o.   MRN: [id number]   Subjective:    Patient ID: Sherry Simon, female    DOB: [birth year], 71 y.o.   MRN: [id number]  HPI  Patient here for a scheduled follow up.  She was having increased back pain as outlined.  Had chest CT.  No acute findings.  Pulmonary nodule present.  Saw her oncologist recently.  Discussed with her.  Planning for bone scan.  The are going to follow up regarding her chest CT (and the pulmonary nodule).  Also saw cardiology.  Scheduled for stress test and echo - Monday after Thanksgiving.  Placed on lasix.  Taking daily.  States does feel better.  Breathing better.  No acid reflux.  No abdominal pain.  Bowels moving.     Past Medical History:  Diagnosis Date  . Anxiety and depression   . Colon polyps   . Depression   . Diabetes mellitus ([place])   . Endometriosis    requiring hysterectomy  . History of chicken pox   . Hypercholesterolemia   . Hypertension   . Nephrolithiasis   . Pericarditis    recurrent, unkown origin  . Tachycardia    Past Surgical History:  Procedure Laterality Date  . ABDOMINAL HYSTERECTOMY  1980  . APPENDECTOMY  1055  . BACK SURGERY  1005-2010   laminectomy  . CHOLECYSTECTOMY     open  . OOPHORECTOMY  1982  . TONSILLECTOMY     Family History  Problem Relation Age of Onset  . Heart disease Father        myocardial infarction - died 48  . Thyroid disease Mother   . Transient ischemic attack Mother        multiple  . Breast cancer Mother   . Hyperlipidemia Mother   . Kidney disease Mother   . Diabetes Mother   . Rheumatic fever Sister        mitral valve problems  . Breast cancer Paternal Aunt   . Other Sister        Small vessel disease  . Colon cancer Neg Hx    Social History   Socioeconomic History  . Marital status: Married    Spouse name: None  . Number of children: 3  . Years of education: None  . Highest education level: None  Social Needs  .  Financial resource strain: None  . Food insecurity - worry: None  . Food insecurity - inability: None  . Transportation needs - medical: None  . Transportation needs - non-medical: None  Occupational History  . None  Tobacco Use  . Smoking status: Never Smoker  . Smokeless tobacco: Never Used  Substance and Sexual Activity  . Alcohol use: No    Alcohol/week: 0.0 oz  . Drug use: No  . Sexual activity: No  Other Topics Concern  . None  Social History Narrative  . None    Outpatient Encounter Medications as of 08/09/2017  Medication Sig  . acyclovir (ZOVIRAX) 400 MG tablet Take one tablet daily  . aspirin 81 MG tablet Take 81 mg by mouth daily.  Marland Kitchen atorvastatin (LIPITOR) 40 MG tablet Take 1 tablet (40 mg total) by mouth daily.  . busPIRone (BUSPAR) 5 MG tablet Take 1 tablet (5 mg total) by mouth daily as needed.  . Calcium Carbonate-Vitamin D 600-400 MG-UNIT tablet Take by mouth.  . citalopram (CELEXA) 40 MG tablet Take 1 tablet (40 mg total) by  mouth daily.  . ferrous sulfate 325 (65 FE) MG EC tablet Take 325 mg by mouth 3 (three) times daily with meals.  . gabapentin (NEURONTIN) 600 MG tablet TAKE 2 TABLETS BY MOUTH IN  THE MORNING AND 3 TABLETS  BY MOUTH IN THE EVENING  . glucose blood (ONE TOUCH ULTRA TEST) test strip TEST BLOOD SUGAR TWICE A DAY  . latanoprost (XALATAN) 0.005 % ophthalmic solution INSTILL 1 DROP INTO THE  LEFT EYE AT BEDTIME  . losartan (COZAAR) 100 MG tablet Take 1 tablet (100 mg total) by mouth daily.  . metFORMIN (GLUCOPHAGE) 500 MG tablet TAKE 2 TABLETS BY MOUTH  EVERY MORNING AND TAKE 1  TABLET AT SUPPER  . oxyCODONE (OXY IR/ROXICODONE) 5 MG immediate release tablet May take up 3 tablets at a time every 6 hours  . pantoprazole (PROTONIX) 40 MG tablet Take 1 tablet (40 mg total) by mouth 2 (two) times daily.   No facility-administered encounter medications on file as of 08/09/2017.     Review of Systems  Constitutional: Negative for appetite change and  unexpected weight change.  HENT: Negative for congestion and sinus pressure.   Respiratory: Negative for cough and chest tightness.        Breathing stable.    Cardiovascular: Negative for palpitations.       Leg swelling better.    Gastrointestinal: Negative for abdominal pain, diarrhea and nausea.  Genitourinary: Negative for difficulty urinating and dysuria.  Musculoskeletal: Positive for back pain. Negative for myalgias.  Skin: Negative for color change and rash.  Neurological: Negative for dizziness, light-headedness and headaches.  Psychiatric/Behavioral: Negative for agitation and dysphoric mood.       Objective:    Physical Exam  Constitutional: She appears well-developed and well-nourished. No distress.  HENT:  Nose: Nose normal.  Mouth/Throat: Oropharynx is clear and moist.  Neck: Neck supple. No thyromegaly present.  Cardiovascular: Normal rate and regular rhythm.  Pulmonary/Chest: Breath sounds normal. No respiratory distress. She has no wheezes.  Abdominal: Soft. Bowel sounds are normal. There is no tenderness.  Musculoskeletal: She exhibits no edema or tenderness.  Lymphadenopathy:    She has no cervical adenopathy.  Skin: No rash noted. No erythema.  Psychiatric: She has a normal mood and affect. Her behavior is normal.    BP 132/64 (BP Location: Left Arm, Patient Position: Sitting, Cuff Size: Normal)   Pulse 82   Temp 98.6 F (37 C) (Oral)   Resp 14   Ht '5\' 4"'  (1.626 m)   Wt 165 lb (74.8 kg)   SpO2 96%   BMI 28.32 kg/m  Wt Readings from Last 3 Encounters:  08/09/17 165 lb (74.8 kg)  07/29/17 163 lb 6.4 oz (74.1 kg)  07/24/17 167 lb (75.8 kg)     Lab Results  Component Value Date   WBC 4.9 07/29/2017   HGB 11.6 (L) 07/29/2017   HCT 35.2 (L) 07/29/2017   PLT 328.0 07/29/2017   GLUCOSE 106 (H) 07/24/2017   CHOL 163 06/04/2017   TRIG 91.0 06/04/2017   HDL 56.30 06/04/2017   LDLCALC 89 06/04/2017   ALT 14 06/04/2017   AST 14 06/04/2017   NA  139 07/24/2017   K 3.8 07/24/2017   CL 106 07/24/2017   CREATININE 0.71 07/24/2017   BUN 11 07/24/2017   CO2 25 07/24/2017   TSH 1.79 06/04/2017   HGBA1C 6.4 06/04/2017   MICROALBUR <0.7 06/04/2017    Ct Chest Wo Contrast  Result Date: 07/31/2017 CLINICAL DATA:  Intermittent left chest pain for the past 6 weeks. History of right breast surgery for breast cancer. EXAM: CT CHEST WITHOUT CONTRAST TECHNIQUE: Multidetector CT imaging of the chest was performed following the standard protocol without IV contrast. COMPARISON:  Chest radiographs dated 07/25/2015 FINDINGS: Cardiovascular: Atheromatous coronary artery calcifications. Normal sized heart. Mediastinum/Nodes: No enlarged mediastinal or axillary lymph nodes. Thyroid gland, trachea, and esophagus demonstrate no significant findings. Lungs/Pleura: 9 mm rounded sub solid nodular opacity in the superior aspect of the superior segment of the left lower lobe. Minimal linear scarring bilaterally. No pleural fluid. Upper Abdomen: Cholecystectomy clips. Musculoskeletal: Thoracic and lower cervical spine degenerative changes. Right breast post lumpectomy changes. IMPRESSION: 1. 9 mm ground-glass nodule in the superior segment of the left lower lobe. Initial follow-up with CT at 6-12 months is recommended to confirm persistence. If persistent, repeat CT is recommended every 2 years until 5 years of stability has been established. This recommendation follows the consensus statement: Guidelines for Management of Incidental Pulmonary Nodules Detected on CT Images: From the Fleischner Society 2017; Radiology 2017; 284:228-243. 2. Otherwise, unremarkable examination. Electronically Signed   By: Claudie Revering M.D.   On: 07/31/2017 10:10       Assessment & Plan:   Problem List Items Addressed This Visit    Anemia    Last hgb 11.6.  Receiving B12 injections.  On iron.  Follow cbc.        Relevant Orders   CBC with Differential/Platelet   Ferritin   B12  deficiency    Recheck B12 level next labs.        Relevant Orders   Vitamin B12   Back pain    Persistent pain.  Does feel better.  CT chest as outlined.  No acute abnormality.  Scheduled for stress test and ECHO.  Planning for bone scan.  Further w/up pending results.        Breast cancer ([place])    Followed by oncology.  Planning for bone scan next week.  Mammogram 01/21/17 - [facility] II.        Diabetes mellitus ([place])    Low carb diet and exercise.  Follow met b and a1c.        Relevant Orders   Hemoglobin Y8X   Basic metabolic panel   GERD (gastroesophageal reflux disease)    Controlled on protonix.        Hypercholesterolemia    On lipitor.  Low cholesterol diet and exercise.  Follow lipid panel and liver function tests.        Relevant Orders   Lipid panel   Hepatic function panel   Hypertension    Blood pressure under good control.  Continue same medication regimen.  Follow pressures.  Follow metabolic panel.        Stress    Increased stress as outlined.  Discussed with her today.  She wants to hold on any further intervention at this time.  Follow.            Einar Pheasant, MD

## 2017-08-11 ENCOUNTER — Encounter: Payer: Self-pay | Admitting: Internal Medicine

## 2017-08-11 NOTE — Assessment & Plan Note (Signed)
Followed by oncology.  Planning for bone scan next week.  Mammogram 01/21/17 - Birads II.

## 2017-08-11 NOTE — Assessment & Plan Note (Signed)
Increased stress as outlined.  Discussed with her today.  She wants to hold on any further intervention at this time.  Follow.

## 2017-08-11 NOTE — Assessment & Plan Note (Signed)
Last hgb 11.6.  Receiving B12 injections.  On iron.  Follow cbc.

## 2017-08-11 NOTE — Assessment & Plan Note (Signed)
Controlled on protonix.   

## 2017-08-11 NOTE — Assessment & Plan Note (Signed)
Persistent pain.  Does feel better.  CT chest as outlined.  No acute abnormality.  Scheduled for stress test and ECHO.  Planning for bone scan.  Further w/up pending results.

## 2017-08-11 NOTE — Assessment & Plan Note (Signed)
Blood pressure under good control.  Continue same medication regimen.  Follow pressures.  Follow metabolic panel.   

## 2017-08-11 NOTE — Assessment & Plan Note (Signed)
Recheck B12 level next labs.

## 2017-08-11 NOTE — Assessment & Plan Note (Signed)
Low carb diet and exercise.  Follow met b and a1c.   

## 2017-08-11 NOTE — Assessment & Plan Note (Signed)
On lipitor.  Low cholesterol diet and exercise.  Follow lipid panel and liver function tests.   

## 2017-08-12 DIAGNOSIS — M549 Dorsalgia, unspecified: Secondary | ICD-10-CM | POA: Diagnosis not present

## 2017-08-12 DIAGNOSIS — C50911 Malignant neoplasm of unspecified site of right female breast: Secondary | ICD-10-CM | POA: Diagnosis not present

## 2017-08-12 DIAGNOSIS — Z170 Estrogen receptor positive status [ER+]: Secondary | ICD-10-CM | POA: Diagnosis not present

## 2017-08-21 DIAGNOSIS — C50911 Malignant neoplasm of unspecified site of right female breast: Secondary | ICD-10-CM | POA: Diagnosis not present

## 2017-08-21 DIAGNOSIS — Z170 Estrogen receptor positive status [ER+]: Secondary | ICD-10-CM | POA: Diagnosis not present

## 2017-08-30 DIAGNOSIS — Z170 Estrogen receptor positive status [ER+]: Secondary | ICD-10-CM | POA: Diagnosis not present

## 2017-08-30 DIAGNOSIS — C50911 Malignant neoplasm of unspecified site of right female breast: Secondary | ICD-10-CM | POA: Diagnosis not present

## 2017-10-09 DIAGNOSIS — M5136 Other intervertebral disc degeneration, lumbar region: Secondary | ICD-10-CM | POA: Diagnosis not present

## 2017-10-09 DIAGNOSIS — M5416 Radiculopathy, lumbar region: Secondary | ICD-10-CM | POA: Diagnosis not present

## 2017-10-24 ENCOUNTER — Encounter: Payer: Self-pay | Admitting: Internal Medicine

## 2017-10-24 ENCOUNTER — Ambulatory Visit (INDEPENDENT_AMBULATORY_CARE_PROVIDER_SITE_OTHER): Payer: Medicare Other | Admitting: Internal Medicine

## 2017-10-24 DIAGNOSIS — E7800 Pure hypercholesterolemia, unspecified: Secondary | ICD-10-CM | POA: Diagnosis not present

## 2017-10-24 DIAGNOSIS — E041 Nontoxic single thyroid nodule: Secondary | ICD-10-CM

## 2017-10-24 DIAGNOSIS — C50919 Malignant neoplasm of unspecified site of unspecified female breast: Secondary | ICD-10-CM

## 2017-10-24 DIAGNOSIS — E119 Type 2 diabetes mellitus without complications: Secondary | ICD-10-CM | POA: Diagnosis not present

## 2017-10-24 DIAGNOSIS — M546 Pain in thoracic spine: Secondary | ICD-10-CM | POA: Diagnosis not present

## 2017-10-24 DIAGNOSIS — D649 Anemia, unspecified: Secondary | ICD-10-CM

## 2017-10-24 DIAGNOSIS — F439 Reaction to severe stress, unspecified: Secondary | ICD-10-CM

## 2017-10-24 DIAGNOSIS — E538 Deficiency of other specified B group vitamins: Secondary | ICD-10-CM | POA: Diagnosis not present

## 2017-10-24 DIAGNOSIS — I10 Essential (primary) hypertension: Secondary | ICD-10-CM

## 2017-10-24 DIAGNOSIS — K219 Gastro-esophageal reflux disease without esophagitis: Secondary | ICD-10-CM

## 2017-10-24 MED ORDER — CYANOCOBALAMIN 1000 MCG/ML IJ SOLN
1000.0000 ug | Freq: Once | INTRAMUSCULAR | Status: AC
  Administered 2017-10-24: 1000 ug via INTRAMUSCULAR

## 2017-10-24 NOTE — Progress Notes (Signed)
Patient ID: Sherry Simon, female   DOB: 1946/09/19, 72 y.o.   MRN: [id number]   Subjective:    Patient ID: Sherry Simon, female    DOB: 16-Oct-1945, 72 y.o.   MRN: [id number]  HPI  Patient here for a scheduled follow up.  Increased stress.  Taking care of her mother and her husband.  Her mother is getting worse.  Discussed at length with her today.  Is getting some help now from her sister.  Overall she feels she feels she is doing relatively well.  Does not feel she needs anything more at this time.  No chest pain.  Breathing stable.  No acid reflux.  No abdominal pain. Bowels moving.  Blood pressure has been doing well.     Past Medical History:  Diagnosis Date  . Anxiety and depression   . Colon polyps   . Depression   . Diabetes mellitus ([place])   . Endometriosis    requiring hysterectomy  . History of chicken pox   . Hypercholesterolemia   . Hypertension   . Nephrolithiasis   . Pericarditis    recurrent, unkown origin  . Tachycardia    Past Surgical History:  Procedure Laterality Date  . ABDOMINAL HYSTERECTOMY  1980  . APPENDECTOMY  1055  . BACK SURGERY  1005-2010   laminectomy  . CHOLECYSTECTOMY     open  . OOPHORECTOMY  1982  . TONSILLECTOMY     Family History  Problem Relation Age of Onset  . Heart disease Father        myocardial infarction - died 82  . Thyroid disease Mother   . Transient ischemic attack Mother        multiple  . Breast cancer Mother   . Hyperlipidemia Mother   . Kidney disease Mother   . Diabetes Mother   . Rheumatic fever Sister        mitral valve problems  . Breast cancer Paternal Aunt   . Other Sister        Small vessel disease  . Colon cancer Neg Hx    Social History   Socioeconomic History  . Marital status: Married    Spouse name: None  . Number of children: 3  . Years of education: None  . Highest education level: None  Social Needs  . Financial resource strain: None  . Food insecurity - worry: None  . Food  insecurity - inability: None  . Transportation needs - medical: None  . Transportation needs - non-medical: None  Occupational History  . None  Tobacco Use  . Smoking status: Never Smoker  . Smokeless tobacco: Never Used  Substance and Sexual Activity  . Alcohol use: No    Alcohol/week: 0.0 oz  . Drug use: No  . Sexual activity: No  Other Topics Concern  . None  Social History Narrative  . None    Outpatient Encounter Medications as of 10/24/2017  Medication Sig  . acyclovir (ZOVIRAX) 400 MG tablet Take one tablet daily  . aspirin 81 MG tablet Take 81 mg by mouth daily.  Marland Kitchen atorvastatin (LIPITOR) 40 MG tablet Take 1 tablet (40 mg total) by mouth daily.  . busPIRone (BUSPAR) 5 MG tablet Take 1 tablet (5 mg total) by mouth daily as needed.  . Calcium Carbonate-Vitamin D 600-400 MG-UNIT tablet Take by mouth.  . citalopram (CELEXA) 40 MG tablet Take 1 tablet (40 mg total) by mouth daily.  . ferrous sulfate 325 (65 FE) MG EC  tablet Take 325 mg by mouth 3 (three) times daily with meals.  . gabapentin (NEURONTIN) 600 MG tablet TAKE 2 TABLETS BY MOUTH IN  THE MORNING AND 3 TABLETS  BY MOUTH IN THE EVENING  . glucose blood (ONE TOUCH ULTRA TEST) test strip TEST BLOOD SUGAR TWICE A DAY  . latanoprost (XALATAN) 0.005 % ophthalmic solution INSTILL 1 DROP INTO THE  LEFT EYE AT BEDTIME  . losartan (COZAAR) 100 MG tablet Take 1 tablet (100 mg total) by mouth daily.  . metFORMIN (GLUCOPHAGE) 500 MG tablet TAKE 2 TABLETS BY MOUTH  EVERY MORNING AND TAKE 1  TABLET AT SUPPER  . oxyCODONE (OXY IR/ROXICODONE) 5 MG immediate release tablet May take up 3 tablets at a time every 6 hours  . pantoprazole (PROTONIX) 40 MG tablet Take 1 tablet (40 mg total) by mouth 2 (two) times daily.  . [EXPIRED] cyanocobalamin ((VITAMIN B-12)) injection 1,000 mcg    No facility-administered encounter medications on file as of 10/24/2017.     Review of Systems  Constitutional: Negative for appetite change and unexpected  weight change.  HENT: Negative for congestion and sinus pressure.   Respiratory: Negative for cough, chest tightness and shortness of breath.   Cardiovascular: Negative for chest pain, palpitations and leg swelling.  Gastrointestinal: Negative for abdominal pain, diarrhea, nausea and vomiting.  Genitourinary: Negative for difficulty urinating and dysuria.  Musculoskeletal: Positive for back pain. Negative for joint swelling and myalgias.  Skin: Negative for color change and rash.  Neurological: Negative for dizziness, light-headedness and headaches.  Psychiatric/Behavioral: Negative for agitation and dysphoric mood.       Increased stress as outlined.        Objective:     Blood pressure rechecked by me:  112-114/64  Physical Exam  Constitutional: She appears well-developed and well-nourished. No distress.  HENT:  Nose: Nose normal.  Mouth/Throat: Oropharynx is clear and moist.  Neck: Neck supple. No thyromegaly present.  Cardiovascular: Normal rate and regular rhythm.  Pulmonary/Chest: Breath sounds normal. No respiratory distress. She has no wheezes.  Abdominal: Soft. Bowel sounds are normal. There is no tenderness.  Musculoskeletal: She exhibits no edema or tenderness.  Lymphadenopathy:    She has no cervical adenopathy.  Skin: No rash noted. No erythema.  Psychiatric: She has a normal mood and affect. Her behavior is normal.    BP (!) 108/58 (BP Location: Left Arm, Patient Position: Sitting, Cuff Size: Normal)   Pulse 84   Temp 98.3 F (36.8 C) (Oral)   Resp 18   Wt 164 lb 3.2 oz (74.5 kg)   SpO2 96%   BMI 28.18 kg/m  Wt Readings from Last 3 Encounters:  10/24/17 164 lb 3.2 oz (74.5 kg)  08/09/17 165 lb (74.8 kg)  07/29/17 163 lb 6.4 oz (74.1 kg)     Lab Results  Component Value Date   WBC 5.4 10/24/2017   HGB 12.3 10/24/2017   HCT 36.6 10/24/2017   PLT 299.0 10/24/2017   GLUCOSE 113 (H) 10/24/2017   CHOL 163 06/04/2017   TRIG 91.0 06/04/2017   HDL 56.30  06/04/2017   LDLCALC 89 06/04/2017   ALT 13 10/24/2017   AST 12 10/24/2017   NA 139 10/24/2017   K 4.4 10/24/2017   CL 102 10/24/2017   CREATININE 0.83 10/24/2017   BUN 19 10/24/2017   CO2 26 10/24/2017   TSH 1.79 06/04/2017   HGBA1C 6.3 10/24/2017   MICROALBUR <0.7 06/04/2017    Ct Chest Wo Contrast  Result Date: 07/31/2017 CLINICAL DATA:  Intermittent left chest pain for the past 6 weeks. History of right breast surgery for breast cancer. EXAM: CT CHEST WITHOUT CONTRAST TECHNIQUE: Multidetector CT imaging of the chest was performed following the standard protocol without IV contrast. COMPARISON:  Chest radiographs dated 07/25/2015 FINDINGS: Cardiovascular: Atheromatous coronary artery calcifications. Normal sized heart. Mediastinum/Nodes: No enlarged mediastinal or axillary lymph nodes. Thyroid gland, trachea, and esophagus demonstrate no significant findings. Lungs/Pleura: 9 mm rounded sub solid nodular opacity in the superior aspect of the superior segment of the left lower lobe. Minimal linear scarring bilaterally. No pleural fluid. Upper Abdomen: Cholecystectomy clips. Musculoskeletal: Thoracic and lower cervical spine degenerative changes. Right breast post lumpectomy changes. IMPRESSION: 1. 9 mm ground-glass nodule in the superior segment of the left lower lobe. Initial follow-up with CT at 6-12 months is recommended to confirm persistence. If persistent, repeat CT is recommended every 2 years until 5 years of stability has been established. This recommendation follows the consensus statement: Guidelines for Management of Incidental Pulmonary Nodules Detected on CT Images: From the Fleischner Society 2017; Radiology 2017; 284:228-243. 2. Otherwise, unremarkable examination. Electronically Signed   By: Claudie Revering M.D.   On: 07/31/2017 10:10       Assessment & Plan:   Problem List Items Addressed This Visit    Anemia    Follow cbc.  Recheck B12 level.        Relevant Medications     cyanocobalamin ((VITAMIN B-12)) injection 1,000 mcg (Completed)   B12 deficiency    Check B12 level.        Relevant Medications   cyanocobalamin ((VITAMIN B-12)) injection 1,000 mcg (Completed)   Back pain    Persistent pain.  Followed by specialist.        Breast cancer ([place])    Followed by oncology.  Just had bone scan and undergoing w/up and evaluation.  Has f/u soon.       Diabetes mellitus ([place])    Low carb diet and exercise.  Follow met b and a1c.  Keep up to date with eye checks.        GERD (gastroesophageal reflux disease)    Controlled on current regimen.  Follow.        Hypercholesterolemia    On lipitor.  Low cholesterol diet and exercise.  Follow lipid panel and liver function tests.        Hypertension    Blood pressure under good control.  Continue same medication regimen.  Follow pressures.  Follow metabolic panel.        Stress    Increased stress as outlined.  Discussed with her today.  She does not feel needs anything more at this time.  Follow.        Thyroid nodule    S/p benign biopsy.  Follow tsh.           Einar Pheasant, MD

## 2017-10-25 LAB — BASIC METABOLIC PANEL
BUN: 19 mg/dL (ref 6–23)
CALCIUM: 9.4 mg/dL (ref 8.4–10.5)
CHLORIDE: 102 meq/L (ref 96–112)
CO2: 26 meq/L (ref 19–32)
CREATININE: 0.83 mg/dL (ref 0.40–1.20)
GFR: 71.9 mL/min (ref >60.00)
Glucose, Bld: 113 mg/dL — ABNORMAL HIGH (ref 70–99)
Potassium: 4.4 mEq/L (ref 3.5–5.1)
Sodium: 139 mEq/L (ref 135–145)

## 2017-10-25 LAB — CBC WITH DIFFERENTIAL/PLATELET
Basophils Absolute: 0 10*3/uL (ref 0.0–0.1)
Basophils Relative: 0.5 % (ref 0.0–3.0)
EOS ABS: 0.1 10*3/uL (ref 0.0–0.7)
EOS PCT: 2.8 % (ref 0.0–5.0)
HEMATOCRIT: 36.6 % (ref 36.0–46.0)
HEMOGLOBIN: 12.3 g/dL (ref 12.0–15.0)
LYMPHS PCT: 40.4 % (ref 12.0–46.0)
Lymphs Abs: 2.2 10*3/uL (ref 0.7–4.0)
MCHC: 33.6 g/dL (ref 30.0–36.0)
MCV: 93.3 fl (ref 78.0–100.0)
MONO ABS: 0.5 10*3/uL (ref 0.1–1.0)
Monocytes Relative: 8.7 % (ref 3.0–12.0)
NEUTROS ABS: 2.6 10*3/uL (ref 1.4–7.7)
Neutrophils Relative %: 47.6 % (ref 43.0–77.0)
PLATELETS: 299 10*3/uL (ref 150.0–400.0)
RBC: 3.92 Mil/uL (ref 3.87–5.11)
RDW: 14.4 % (ref 11.5–15.5)
WBC: 5.4 10*3/uL (ref 4.0–10.5)

## 2017-10-25 LAB — HEPATIC FUNCTION PANEL
ALBUMIN: 4.3 g/dL (ref 3.5–5.2)
ALT: 13 U/L (ref 0–35)
AST: 12 U/L (ref 0–37)
Alkaline Phosphatase: 55 U/L (ref 39–117)
Bilirubin, Direct: 0.2 mg/dL (ref 0.0–0.3)
Total Bilirubin: 0.8 mg/dL (ref 0.2–1.2)
Total Protein: 6.8 g/dL (ref 6.0–8.3)

## 2017-10-25 LAB — HEMOGLOBIN A1C: Hgb A1c MFr Bld: 6.3 % (ref 4.6–6.5)

## 2017-10-25 LAB — FERRITIN: Ferritin: 18.7 ng/mL (ref 10.0–291.0)

## 2017-10-25 LAB — VITAMIN B12: VITAMIN B 12: 208 pg/mL — AB (ref 211–911)

## 2017-10-27 ENCOUNTER — Encounter: Payer: Self-pay | Admitting: Internal Medicine

## 2017-10-27 DIAGNOSIS — Z0000 Encounter for general adult medical examination without abnormal findings: Secondary | ICD-10-CM | POA: Insufficient documentation

## 2017-10-27 NOTE — Assessment & Plan Note (Signed)
Controlled on current regimen.  Follow.  

## 2017-10-27 NOTE — Assessment & Plan Note (Signed)
Blood pressure under good control.  Continue same medication regimen.  Follow pressures.  Follow metabolic panel.   

## 2017-10-27 NOTE — Assessment & Plan Note (Signed)
Persistent pain.  Followed by specialist.

## 2017-10-27 NOTE — Assessment & Plan Note (Signed)
Followed by oncology.  Just had bone scan and undergoing w/up and evaluation.  Has f/u soon.

## 2017-10-27 NOTE — Assessment & Plan Note (Signed)
Follow cbc.  Recheck B12 level.

## 2017-10-27 NOTE — Assessment & Plan Note (Signed)
On lipitor.  Low cholesterol diet and exercise.  Follow lipid panel and liver function tests.   

## 2017-10-27 NOTE — Assessment & Plan Note (Signed)
S/p benign biopsy.  Follow tsh.  

## 2017-10-27 NOTE — Assessment & Plan Note (Signed)
Check B12 level. 

## 2017-10-27 NOTE — Assessment & Plan Note (Signed)
Low carb diet and exercise.  Follow met b and a1c.  Keep up to date with eye checks.

## 2017-10-27 NOTE — Assessment & Plan Note (Signed)
Increased stress as outlined.  Discussed with her today.  She does not feel needs anything more at this time.  Follow.   

## 2017-10-30 ENCOUNTER — Ambulatory Visit: Payer: Medicare Other

## 2017-10-31 ENCOUNTER — Ambulatory Visit: Payer: Medicare Other

## 2017-11-05 ENCOUNTER — Ambulatory Visit (INDEPENDENT_AMBULATORY_CARE_PROVIDER_SITE_OTHER): Payer: Medicare Other | Admitting: *Deleted

## 2017-11-05 DIAGNOSIS — E538 Deficiency of other specified B group vitamins: Secondary | ICD-10-CM | POA: Diagnosis not present

## 2017-11-05 MED ORDER — CYANOCOBALAMIN 1000 MCG/ML IJ SOLN
1000.0000 ug | Freq: Once | INTRAMUSCULAR | Status: AC
Start: 2017-11-05 — End: 2017-11-05
  Administered 2017-11-05: 1000 ug via INTRAMUSCULAR

## 2017-11-05 NOTE — Progress Notes (Addendum)
Patient presented for B 12 injection to left deltoid, patient voiced no concerns nor showed any signs of distress during injection.  Reviewed.  Dr Scott 

## 2017-11-12 ENCOUNTER — Ambulatory Visit (INDEPENDENT_AMBULATORY_CARE_PROVIDER_SITE_OTHER): Payer: Medicare Other

## 2017-11-12 DIAGNOSIS — E538 Deficiency of other specified B group vitamins: Secondary | ICD-10-CM | POA: Diagnosis not present

## 2017-11-12 MED ORDER — CYANOCOBALAMIN 1000 MCG/ML IJ SOLN
1000.0000 ug | Freq: Once | INTRAMUSCULAR | Status: AC
Start: 2017-11-12 — End: 2017-11-12
  Administered 2017-11-12: 1000 ug via INTRAMUSCULAR

## 2017-11-12 NOTE — Progress Notes (Signed)
Patient comes in for B 12 injection.  Injected left deltoid. She has history of breast cancer unable to give in left arm. Patient tolerated injection well.   Reviewed.  Dr Nicki Reaper

## 2017-11-14 ENCOUNTER — Other Ambulatory Visit: Payer: Self-pay

## 2017-11-14 MED ORDER — BUSPIRONE HCL 5 MG PO TABS: 5.0000 mg | ORAL_TABLET | Freq: Every day | ORAL | 1 refills | Status: DC | PRN

## 2017-11-19 ENCOUNTER — Ambulatory Visit (INDEPENDENT_AMBULATORY_CARE_PROVIDER_SITE_OTHER): Payer: Medicare Other | Admitting: *Deleted

## 2017-11-19 DIAGNOSIS — E538 Deficiency of other specified B group vitamins: Secondary | ICD-10-CM

## 2017-11-19 MED ORDER — CYANOCOBALAMIN 1000 MCG/ML IJ SOLN
1000.0000 ug | Freq: Once | INTRAMUSCULAR | Status: AC
Start: 2017-11-19 — End: 2017-11-19
  Administered 2017-11-19: 1000 ug via INTRAMUSCULAR

## 2017-11-19 NOTE — Progress Notes (Addendum)
Patient presented for B 12 injection to left deltoid, patient voiced no concerns nor showed any signs of distress during injection.  Reviewed.  Dr Scott 

## 2017-11-20 ENCOUNTER — Other Ambulatory Visit: Payer: Self-pay | Admitting: Internal Medicine

## 2017-11-26 ENCOUNTER — Ambulatory Visit (INDEPENDENT_AMBULATORY_CARE_PROVIDER_SITE_OTHER): Payer: Medicare Other

## 2017-11-26 DIAGNOSIS — E538 Deficiency of other specified B group vitamins: Secondary | ICD-10-CM | POA: Diagnosis not present

## 2017-11-26 MED ORDER — CYANOCOBALAMIN 1000 MCG/ML IJ SOLN
1000.0000 ug | Freq: Once | INTRAMUSCULAR | Status: AC
  Administered 2017-11-26: 1000 ug via INTRAMUSCULAR

## 2017-11-26 NOTE — Progress Notes (Addendum)
Patient comes in for B 12 injection .   Injected left deltoid, due to history of breast cancer in left .   Patient tolerated injection well.   Reviewed.  Dr Nicki Reaper

## 2017-12-13 ENCOUNTER — Other Ambulatory Visit: Payer: Self-pay | Admitting: Internal Medicine

## 2017-12-13 DIAGNOSIS — K219 Gastro-esophageal reflux disease without esophagitis: Secondary | ICD-10-CM

## 2017-12-13 DIAGNOSIS — I10 Essential (primary) hypertension: Secondary | ICD-10-CM

## 2017-12-13 DIAGNOSIS — E7800 Pure hypercholesterolemia, unspecified: Secondary | ICD-10-CM

## 2017-12-25 DIAGNOSIS — M5136 Other intervertebral disc degeneration, lumbar region: Secondary | ICD-10-CM | POA: Diagnosis not present

## 2017-12-25 DIAGNOSIS — M5416 Radiculopathy, lumbar region: Secondary | ICD-10-CM | POA: Diagnosis not present

## 2017-12-26 ENCOUNTER — Telehealth: Payer: Self-pay

## 2017-12-26 MED ORDER — BUSPIRONE HCL 5 MG PO TABS: 5.0000 mg | ORAL_TABLET | Freq: Every day | ORAL | 1 refills | Status: DC | PRN

## 2017-12-26 NOTE — Telephone Encounter (Signed)
rx for busprione sent to pharmacy

## 2017-12-26 NOTE — Telephone Encounter (Signed)
rx frefill for buspirone filled and sent to [facility]

## 2017-12-31 ENCOUNTER — Ambulatory Visit (INDEPENDENT_AMBULATORY_CARE_PROVIDER_SITE_OTHER): Payer: Medicare Other

## 2017-12-31 DIAGNOSIS — E538 Deficiency of other specified B group vitamins: Secondary | ICD-10-CM

## 2017-12-31 MED ORDER — CYANOCOBALAMIN 1000 MCG/ML IJ SOLN
1000.0000 ug | Freq: Once | INTRAMUSCULAR | Status: AC
  Administered 2017-12-31: 1000 ug via INTRAMUSCULAR

## 2017-12-31 NOTE — Progress Notes (Addendum)
Patient comes in for B 12 injection.  Injected left deltoid, due to patient has history of breast cancer . Patient tolerated injection well.   Reviewed.  Dr Nicki Reaper

## 2018-01-22 DIAGNOSIS — M5136 Other intervertebral disc degeneration, lumbar region: Secondary | ICD-10-CM | POA: Diagnosis not present

## 2018-01-22 DIAGNOSIS — G894 Chronic pain syndrome: Secondary | ICD-10-CM | POA: Diagnosis not present

## 2018-01-22 DIAGNOSIS — M5416 Radiculopathy, lumbar region: Secondary | ICD-10-CM | POA: Diagnosis not present

## 2018-01-23 ENCOUNTER — Encounter: Payer: Medicare Other | Admitting: Internal Medicine

## 2018-01-23 DIAGNOSIS — H3322 Serous retinal detachment, left eye: Secondary | ICD-10-CM | POA: Diagnosis not present

## 2018-01-23 DIAGNOSIS — H35372 Puckering of macula, left eye: Secondary | ICD-10-CM | POA: Diagnosis not present

## 2018-01-23 DIAGNOSIS — E119 Type 2 diabetes mellitus without complications: Secondary | ICD-10-CM | POA: Diagnosis not present

## 2018-01-23 DIAGNOSIS — H35052 Retinal neovascularization, unspecified, left eye: Secondary | ICD-10-CM | POA: Diagnosis not present

## 2018-01-23 DIAGNOSIS — H43813 Vitreous degeneration, bilateral: Secondary | ICD-10-CM | POA: Diagnosis not present

## 2018-01-27 DIAGNOSIS — C50811 Malignant neoplasm of overlapping sites of right female breast: Secondary | ICD-10-CM | POA: Diagnosis not present

## 2018-01-27 DIAGNOSIS — N6320 Unspecified lump in the left breast, unspecified quadrant: Secondary | ICD-10-CM | POA: Diagnosis not present

## 2018-01-27 DIAGNOSIS — Z170 Estrogen receptor positive status [ER+]: Secondary | ICD-10-CM | POA: Diagnosis not present

## 2018-01-27 DIAGNOSIS — Z853 Personal history of malignant neoplasm of breast: Secondary | ICD-10-CM | POA: Diagnosis not present

## 2018-01-27 DIAGNOSIS — C50911 Malignant neoplasm of unspecified site of right female breast: Secondary | ICD-10-CM | POA: Diagnosis not present

## 2018-01-27 DIAGNOSIS — C50411 Malignant neoplasm of upper-outer quadrant of right female breast: Secondary | ICD-10-CM | POA: Diagnosis not present

## 2018-02-03 DIAGNOSIS — M8580 Other specified disorders of bone density and structure, unspecified site: Secondary | ICD-10-CM | POA: Diagnosis not present

## 2018-02-03 DIAGNOSIS — Z170 Estrogen receptor positive status [ER+]: Secondary | ICD-10-CM | POA: Diagnosis not present

## 2018-02-03 DIAGNOSIS — R232 Flushing: Secondary | ICD-10-CM | POA: Diagnosis not present

## 2018-02-03 DIAGNOSIS — R0602 Shortness of breath: Secondary | ICD-10-CM | POA: Diagnosis not present

## 2018-02-03 DIAGNOSIS — C50911 Malignant neoplasm of unspecified site of right female breast: Secondary | ICD-10-CM | POA: Diagnosis not present

## 2018-02-03 DIAGNOSIS — M810 Age-related osteoporosis without current pathological fracture: Secondary | ICD-10-CM | POA: Diagnosis not present

## 2018-02-03 DIAGNOSIS — R918 Other nonspecific abnormal finding of lung field: Secondary | ICD-10-CM | POA: Diagnosis not present

## 2018-02-03 DIAGNOSIS — R911 Solitary pulmonary nodule: Secondary | ICD-10-CM | POA: Diagnosis not present

## 2018-02-03 DIAGNOSIS — M25512 Pain in left shoulder: Secondary | ICD-10-CM | POA: Diagnosis not present

## 2018-02-03 DIAGNOSIS — M549 Dorsalgia, unspecified: Secondary | ICD-10-CM | POA: Diagnosis not present

## 2018-02-04 ENCOUNTER — Ambulatory Visit (INDEPENDENT_AMBULATORY_CARE_PROVIDER_SITE_OTHER): Payer: Medicare Other | Admitting: *Deleted

## 2018-02-04 DIAGNOSIS — E538 Deficiency of other specified B group vitamins: Secondary | ICD-10-CM | POA: Diagnosis not present

## 2018-02-04 MED ORDER — CYANOCOBALAMIN 1000 MCG/ML IJ SOLN
1000.0000 ug | Freq: Once | INTRAMUSCULAR | Status: AC
  Administered 2018-02-04: 1000 ug via INTRAMUSCULAR

## 2018-02-04 NOTE — Progress Notes (Signed)
Patient presented for B 12 injection to left deltoid, patient voiced no concerns nor showed any signs of distress during injection. 

## 2018-02-06 ENCOUNTER — Encounter: Payer: Self-pay | Admitting: Internal Medicine

## 2018-02-06 ENCOUNTER — Ambulatory Visit (INDEPENDENT_AMBULATORY_CARE_PROVIDER_SITE_OTHER): Payer: Medicare Other | Admitting: Internal Medicine

## 2018-02-06 VITALS — BP 100/60 | HR 84 | Temp 97.5°F | Resp 15 | Ht 64.0 in | Wt 164.6 lb

## 2018-02-06 DIAGNOSIS — M546 Pain in thoracic spine: Secondary | ICD-10-CM

## 2018-02-06 DIAGNOSIS — E119 Type 2 diabetes mellitus without complications: Secondary | ICD-10-CM

## 2018-02-06 DIAGNOSIS — E538 Deficiency of other specified B group vitamins: Secondary | ICD-10-CM

## 2018-02-06 DIAGNOSIS — E041 Nontoxic single thyroid nodule: Secondary | ICD-10-CM | POA: Diagnosis not present

## 2018-02-06 DIAGNOSIS — D649 Anemia, unspecified: Secondary | ICD-10-CM

## 2018-02-06 DIAGNOSIS — I10 Essential (primary) hypertension: Secondary | ICD-10-CM | POA: Diagnosis not present

## 2018-02-06 DIAGNOSIS — E7800 Pure hypercholesterolemia, unspecified: Secondary | ICD-10-CM | POA: Diagnosis not present

## 2018-02-06 DIAGNOSIS — F439 Reaction to severe stress, unspecified: Secondary | ICD-10-CM | POA: Diagnosis not present

## 2018-02-06 DIAGNOSIS — C50919 Malignant neoplasm of unspecified site of unspecified female breast: Secondary | ICD-10-CM

## 2018-02-06 DIAGNOSIS — K219 Gastro-esophageal reflux disease without esophagitis: Secondary | ICD-10-CM

## 2018-02-06 DIAGNOSIS — R911 Solitary pulmonary nodule: Secondary | ICD-10-CM | POA: Diagnosis not present

## 2018-02-06 LAB — COMPREHENSIVE METABOLIC PANEL
ALK PHOS: 47 U/L (ref 39–117)
ALT: 12 U/L (ref 0–35)
AST: 13 U/L (ref 0–37)
Albumin: 4.2 g/dL (ref 3.5–5.2)
BILIRUBIN TOTAL: 1.4 mg/dL — AB (ref 0.2–1.2)
BUN: 14 mg/dL (ref 6–23)
CO2: 27 meq/L (ref 19–32)
CREATININE: 0.78 mg/dL (ref 0.40–1.20)
Calcium: 9.1 mg/dL (ref 8.4–10.5)
Chloride: 102 mEq/L (ref 96–112)
GFR: 77.18 mL/min (ref >60.00)
GLUCOSE: 116 mg/dL — AB (ref 70–99)
Potassium: 4 mEq/L (ref 3.5–5.1)
SODIUM: 137 meq/L (ref 135–145)
TOTAL PROTEIN: 6.6 g/dL (ref 6.0–8.3)

## 2018-02-06 LAB — CBC WITH DIFFERENTIAL/PLATELET
BASOS ABS: 0 10*3/uL (ref 0.0–0.1)
Basophils Relative: 0.4 % (ref 0.0–3.0)
EOS ABS: 0.1 10*3/uL (ref 0.0–0.7)
Eosinophils Relative: 1.8 % (ref 0.0–5.0)
HCT: 33.8 % — ABNORMAL LOW (ref 36.0–46.0)
Hemoglobin: 11.5 g/dL — ABNORMAL LOW (ref 12.0–15.0)
Lymphocytes Relative: 41.8 % (ref 12.0–46.0)
Lymphs Abs: 2.1 10*3/uL (ref 0.7–4.0)
MCHC: 34 g/dL (ref 30.0–36.0)
MCV: 93.8 fl (ref 78.0–100.0)
MONOS PCT: 9 % (ref 3.0–12.0)
Monocytes Absolute: 0.4 10*3/uL (ref 0.1–1.0)
Neutro Abs: 2.4 10*3/uL (ref 1.4–7.7)
Neutrophils Relative %: 47 % (ref 43.0–77.0)
PLATELETS: 273 10*3/uL (ref 150.0–400.0)
RBC: 3.6 Mil/uL — AB (ref 3.87–5.11)
RDW: 13.2 % (ref 11.5–15.5)
WBC: 5 10*3/uL (ref 4.0–10.5)

## 2018-02-06 LAB — HEMOGLOBIN A1C: Hgb A1c MFr Bld: 6.2 % (ref 4.6–6.5)

## 2018-02-06 LAB — FERRITIN: Ferritin: 43.3 ng/mL (ref 10.0–291.0)

## 2018-02-06 LAB — TSH: TSH: 1.5 u[IU]/mL (ref 0.35–4.50)

## 2018-02-06 NOTE — Progress Notes (Signed)
Patient ID: Sherry Simon, female   DOB: 1945/11/15, 72 y.o.   MRN: [id number]   Subjective:    Patient ID: Sherry Simon, female    DOB: 05/02/1946, 72 y.o.   MRN: [id number]  HPI  Patient here for a scheduled follow up.  She reports increased stress.  Taking care of her mother and her husband.  Discussed with her today.  Does not feel she needs any further evaluation or treatment at this time.  Also increased stress with her current medical issues.  Lung nodule found on CT.  Unable to biopsy.  Had questions regarding f/u scan.  No chest pain.  No sob.  No acid reflux.  No abdominal pain.  Bowels moving.  Taking gabapentin.     Past Medical History:  Diagnosis Date  . Anxiety and depression   . Colon polyps   . Depression   . Diabetes mellitus ([place])   . Endometriosis    requiring hysterectomy  . History of chicken pox   . Hypercholesterolemia   . Hypertension   . Nephrolithiasis   . Pericarditis    recurrent, unkown origin  . Tachycardia    Past Surgical History:  Procedure Laterality Date  . ABDOMINAL HYSTERECTOMY  1980  . APPENDECTOMY  1055  . BACK SURGERY  1005-2010   laminectomy  . CHOLECYSTECTOMY     open  . OOPHORECTOMY  1982  . TONSILLECTOMY     Family History  Problem Relation Age of Onset  . Heart disease Father        myocardial infarction - died 47  . Thyroid disease Mother   . Transient ischemic attack Mother        multiple  . Breast cancer Mother   . Hyperlipidemia Mother   . Kidney disease Mother   . Diabetes Mother   . Rheumatic fever Sister        mitral valve problems  . Breast cancer Paternal Aunt   . Other Sister        Small vessel disease  . Colon cancer Neg Hx    Social History   Socioeconomic History  . Marital status: Married    Spouse name: Not on file  . Number of children: 3  . Years of education: Not on file  . Highest education level: Not on file  Occupational History  . Not on file  Social Needs  . Financial  resource strain: Not on file  . Food insecurity:    Worry: Not on file    Inability: Not on file  . Transportation needs:    Medical: Not on file    Non-medical: Not on file  Tobacco Use  . Smoking status: Never Smoker  . Smokeless tobacco: Never Used  Substance and Sexual Activity  . Alcohol use: No    Alcohol/week: 0.0 oz  . Drug use: No  . Sexual activity: Never  Lifestyle  . Physical activity:    Days per week: Not on file    Minutes per session: Not on file  . Stress: Not on file  Relationships  . Social connections:    Talks on phone: Not on file    Gets together: Not on file    Attends religious service: Not on file    Active member of club or organization: Not on file    Attends meetings of clubs or organizations: Not on file    Relationship status: Not on file  Other Topics Concern  . Not on  file  Social History Narrative  . Not on file    Outpatient Encounter Medications as of 02/06/2018  Medication Sig  . acyclovir (ZOVIRAX) 400 MG tablet Take one tablet daily  . aspirin 81 MG tablet Take 81 mg by mouth daily.  Marland Kitchen atorvastatin (LIPITOR) 40 MG tablet TAKE 1 TABLET BY MOUTH  DAILY  . busPIRone (BUSPAR) 5 MG tablet Take 1 tablet (5 mg total) by mouth daily as needed.  . Calcium Carbonate-Vitamin D 600-400 MG-UNIT tablet Take by mouth.  . citalopram (CELEXA) 40 MG tablet TAKE 1 TABLET BY MOUTH  DAILY  . ferrous sulfate 325 (65 FE) MG EC tablet Take 325 mg by mouth 3 (three) times daily with meals.  . gabapentin (NEURONTIN) 600 MG tablet TAKE 2 TABLETS BY MOUTH IN  THE MORNING AND 3 TABLETS  BY MOUTH IN THE EVENING  . glucose blood (ONE TOUCH ULTRA TEST) test strip TEST BLOOD SUGAR TWICE A DAY  . HYDROcodone-acetaminophen (NORCO) 10-325 MG tablet TAKE 1 TABLET BY MOUTH THREE TIMES DAILY AS NEEDED FOR CHRONIC PAIN  . latanoprost (XALATAN) 0.005 % ophthalmic solution INSTILL 1 DROP INTO THE  LEFT EYE AT BEDTIME  . losartan (COZAAR) 100 MG tablet TAKE 1 TABLET BY MOUTH   DAILY  . metFORMIN (GLUCOPHAGE) 500 MG tablet TAKE 2 TABLETS BY MOUTH  EVERY MORNING AND TAKE 1  TABLET AT SUPPER  . morphine (MS CONTIN) 15 MG 12 hr tablet Take 15 mg by mouth every 12 (twelve) hours.  Marland Kitchen oxyCODONE (OXY IR/ROXICODONE) 5 MG immediate release tablet May take up 3 tablets at a time every 6 hours  . pantoprazole (PROTONIX) 40 MG tablet TAKE 1 TABLET BY MOUTH TWO  TIMES DAILY   No facility-administered encounter medications on file as of 02/06/2018.     Review of Systems  Constitutional: Negative for appetite change and unexpected weight change.  HENT: Negative for congestion and sinus pressure.   Respiratory: Negative for cough, chest tightness and shortness of breath.   Cardiovascular: Negative for chest pain, palpitations and leg swelling.  Gastrointestinal: Negative for abdominal pain, diarrhea, nausea and vomiting.  Genitourinary: Negative for difficulty urinating and dysuria.  Musculoskeletal: Positive for back pain. Negative for joint swelling and myalgias.  Skin: Negative for color change and rash.  Neurological: Negative for dizziness, light-headedness and headaches.  Psychiatric/Behavioral: Negative for agitation.       Increased stress as outlined.         Objective:    Physical Exam  Constitutional: She appears well-developed and well-nourished. No distress.  HENT:  Nose: Nose normal.  Mouth/Throat: Oropharynx is clear and moist.  Neck: Neck supple. No thyromegaly present.  Cardiovascular: Normal rate and regular rhythm.  Pulmonary/Chest: Breath sounds normal. No respiratory distress. She has no wheezes.  Abdominal: Soft. Bowel sounds are normal. There is no tenderness.  Musculoskeletal: She exhibits no edema or tenderness.  Lymphadenopathy:    She has no cervical adenopathy.  Skin: No rash noted. No erythema.  Psychiatric: She has a normal mood and affect. Her behavior is normal.    BP 100/60 (BP Location: Left Arm, Patient Position: Sitting, Cuff  Size: Normal)   Pulse 84   Temp (!) 97.5 F (36.4 C) (Oral)   Resp 15   Ht '5\' 4"'  (1.626 m)   Wt 164 lb 9.6 oz (74.7 kg)   SpO2 96%   BMI 28.25 kg/m  Wt Readings from Last 3 Encounters:  02/06/18 164 lb 9.6 oz (74.7 kg)  10/24/17 164 lb 3.2 oz (74.5 kg)  08/09/17 165 lb (74.8 kg)     Lab Results  Component Value Date   WBC 5.0 02/06/2018   HGB 11.5 (L) 02/06/2018   HCT 33.8 (L) 02/06/2018   PLT 273.0 02/06/2018   GLUCOSE 116 (H) 02/06/2018   CHOL 163 06/04/2017   TRIG 91.0 06/04/2017   HDL 56.30 06/04/2017   LDLCALC 89 06/04/2017   ALT 12 02/06/2018   AST 13 02/06/2018   NA 137 02/06/2018   K 4.0 02/06/2018   CL 102 02/06/2018   CREATININE 0.78 02/06/2018   BUN 14 02/06/2018   CO2 27 02/06/2018   TSH 1.50 02/06/2018   HGBA1C 6.2 02/06/2018   MICROALBUR <0.7 06/04/2017    Ct Chest Wo Contrast  Result Date: 07/31/2017 CLINICAL DATA:  Intermittent left chest pain for the past 6 weeks. History of right breast surgery for breast cancer. EXAM: CT CHEST WITHOUT CONTRAST TECHNIQUE: Multidetector CT imaging of the chest was performed following the standard protocol without IV contrast. COMPARISON:  Chest radiographs dated 07/25/2015 FINDINGS: Cardiovascular: Atheromatous coronary artery calcifications. Normal sized heart. Mediastinum/Nodes: No enlarged mediastinal or axillary lymph nodes. Thyroid gland, trachea, and esophagus demonstrate no significant findings. Lungs/Pleura: 9 mm rounded sub solid nodular opacity in the superior aspect of the superior segment of the left lower lobe. Minimal linear scarring bilaterally. No pleural fluid. Upper Abdomen: Cholecystectomy clips. Musculoskeletal: Thoracic and lower cervical spine degenerative changes. Right breast post lumpectomy changes. IMPRESSION: 1. 9 mm ground-glass nodule in the superior segment of the left lower lobe. Initial follow-up with CT at 6-12 months is recommended to confirm persistence. If persistent, repeat CT is  recommended every 2 years until 5 years of stability has been established. This recommendation follows the consensus statement: Guidelines for Management of Incidental Pulmonary Nodules Detected on CT Images: From the Fleischner Society 2017; Radiology 2017; [id number]. 2. Otherwise, unremarkable examination. Electronically Signed   By: Claudie Revering M.D.   On: 07/31/2017 10:10       Assessment & Plan:   Problem List Items Addressed This Visit    Anemia - Primary    Follow cbc.        Relevant Orders   CBC with Differential/Platelet (Completed)   Ferritin (Completed)   B12 deficiency    Continue b12 injections.        Back pain    Persistent pain.  Followed by specialist.        Relevant Medications   HYDROcodone-acetaminophen (NORCO) 10-325 MG tablet   morphine (MS CONTIN) 15 MG 12 hr tablet   Breast cancer ([place])    Followed by oncology.        Diabetes mellitus ([place])    Low carb diet and exercise.  Follow met b and a1c.        Relevant Orders   Hemoglobin A1c (Completed)   GERD (gastroesophageal reflux disease)    Controlled on current regimen.  Follow.        Hypercholesterolemia    On lipitor.  Low cholesterol diet and exercise.  Follow lipid panel and liver function tests.        Hypertension    Blood pressure under good control.  Continue same medication regimen.  Follow pressures.  Follow metabolic panel.        Relevant Orders   Comprehensive metabolic panel (Completed)   TSH (Completed)   Lung nodule    Lung nodule as outlined.  Found on recent CT.  Unable  to biopsy.  Has questions regarding f/u scan. Discussed options with her.  She plans to discuss with oncology and further w/up pending their discussion.  Will notify me of planned f/u.        Stress    Increased stress as outlined.  Discussed with her today.  She desires no further intervention.  Follow.        Thyroid nodule    S/p benign biopsy.  Follow tsh.            Einar Pheasant,  MD

## 2018-02-07 ENCOUNTER — Other Ambulatory Visit (INDEPENDENT_AMBULATORY_CARE_PROVIDER_SITE_OTHER): Payer: Medicare Other

## 2018-02-07 ENCOUNTER — Other Ambulatory Visit: Payer: Self-pay | Admitting: Internal Medicine

## 2018-02-07 DIAGNOSIS — D649 Anemia, unspecified: Secondary | ICD-10-CM

## 2018-02-07 LAB — IBC PANEL
Iron: 73 ug/dL (ref 42–145)
Saturation Ratios: 24.1 % (ref 20.0–50.0)
Transferrin: 216 mg/dL (ref 212.0–360.0)

## 2018-02-07 LAB — BILIRUBIN, DIRECT: Bilirubin, Direct: 0.3 mg/dL (ref 0.0–0.3)

## 2018-02-07 NOTE — Progress Notes (Signed)
Order placed for add on labs.   °

## 2018-02-10 ENCOUNTER — Telehealth: Payer: Self-pay | Admitting: Internal Medicine

## 2018-02-10 NOTE — Telephone Encounter (Signed)
Charted in result notes. 

## 2018-02-10 NOTE — Telephone Encounter (Signed)
Copied from [place] [id number]. Topic: General - Other >> Feb 10, 2018 10:48 AM Yvette Rack wrote: Reason for CRM: Pt returned call regarding lab results. Pt requested call back

## 2018-02-14 ENCOUNTER — Other Ambulatory Visit: Payer: Self-pay | Admitting: Internal Medicine

## 2018-02-16 ENCOUNTER — Encounter: Payer: Self-pay | Admitting: Internal Medicine

## 2018-02-16 DIAGNOSIS — R911 Solitary pulmonary nodule: Secondary | ICD-10-CM | POA: Insufficient documentation

## 2018-02-16 NOTE — Assessment & Plan Note (Signed)
Controlled on current regimen.  Follow.  

## 2018-02-16 NOTE — Assessment & Plan Note (Signed)
S/p benign biopsy.  Follow tsh.  

## 2018-02-16 NOTE — Assessment & Plan Note (Signed)
Blood pressure under good control.  Continue same medication regimen.  Follow pressures.  Follow metabolic panel.   

## 2018-02-16 NOTE — Assessment & Plan Note (Signed)
Followed by oncology 

## 2018-02-16 NOTE — Assessment & Plan Note (Signed)
Increased stress as outlined.  Discussed with her today. She desires no further intervention.  Follow.   

## 2018-02-16 NOTE — Assessment & Plan Note (Signed)
Low carb diet and exercise.  Follow met b and a1c.   

## 2018-02-16 NOTE — Assessment & Plan Note (Signed)
Follow cbc.  

## 2018-02-16 NOTE — Assessment & Plan Note (Signed)
Lung nodule as outlined.  Found on recent CT.  Unable to biopsy.  Has questions regarding f/u scan. Discussed options with her.  She plans to discuss with oncology and further w/up pending their discussion.  Will notify me of planned f/u.

## 2018-02-16 NOTE — Assessment & Plan Note (Signed)
Persistent pain.  Followed by specialist.

## 2018-02-16 NOTE — Assessment & Plan Note (Signed)
On lipitor.  Low cholesterol diet and exercise.  Follow lipid panel and liver function tests.   

## 2018-02-16 NOTE — Assessment & Plan Note (Signed)
Continue b12 injections.  

## 2018-03-11 ENCOUNTER — Ambulatory Visit: Payer: Medicare Other

## 2018-03-12 ENCOUNTER — Ambulatory Visit (INDEPENDENT_AMBULATORY_CARE_PROVIDER_SITE_OTHER): Payer: Medicare Other | Admitting: *Deleted

## 2018-03-12 DIAGNOSIS — E538 Deficiency of other specified B group vitamins: Secondary | ICD-10-CM | POA: Diagnosis not present

## 2018-03-12 MED ORDER — CYANOCOBALAMIN 1000 MCG/ML IJ SOLN
1000.0000 ug | Freq: Once | INTRAMUSCULAR | Status: AC
  Administered 2018-03-12: 1000 ug via INTRAMUSCULAR

## 2018-03-12 NOTE — Progress Notes (Signed)
Patient presented for B 12 injection to left deltoid, patient voiced no concerns nor showed any signs of distress during injection.  Injections always given in left arm due to breast surgery.

## 2018-03-16 NOTE — Progress Notes (Signed)
  I have reviewed the above information and agree with above.   Mumin Denomme, MD 

## 2018-03-17 ENCOUNTER — Ambulatory Visit: Payer: Medicare Other | Admitting: Internal Medicine

## 2018-03-20 ENCOUNTER — Telehealth: Payer: Self-pay

## 2018-03-20 NOTE — Telephone Encounter (Signed)
Copied from [place] [id number]. Topic: Quick Communication - See Telephone Encounter >> Mar 20, 2018 11:16 AM Antonieta Iba C wrote: CRM for notification. See Telephone encounter for: 03/20/18.  Pt had an apt with PCP scheduled for tomorrow 03/21/18, pt is unable to make apt, pcp next available isn't until October pt would like to know if provider is able to work her in sooner? Pt also had her wellness visit scheduled, pt would like to re-schedule that apt based on her apt date w/PCP   Please assist

## 2018-03-21 ENCOUNTER — Ambulatory Visit: Payer: Medicare Other | Admitting: Internal Medicine

## 2018-03-21 ENCOUNTER — Ambulatory Visit: Payer: Medicare Other

## 2018-03-25 NOTE — Telephone Encounter (Signed)
Pt has appt in August

## 2018-03-26 DIAGNOSIS — G894 Chronic pain syndrome: Secondary | ICD-10-CM | POA: Diagnosis not present

## 2018-03-26 DIAGNOSIS — M5136 Other intervertebral disc degeneration, lumbar region: Secondary | ICD-10-CM | POA: Diagnosis not present

## 2018-03-26 DIAGNOSIS — M5416 Radiculopathy, lumbar region: Secondary | ICD-10-CM | POA: Diagnosis not present

## 2018-04-15 ENCOUNTER — Ambulatory Visit: Payer: Medicare Other

## 2018-04-17 ENCOUNTER — Ambulatory Visit (INDEPENDENT_AMBULATORY_CARE_PROVIDER_SITE_OTHER): Payer: Medicare Other

## 2018-04-17 DIAGNOSIS — E538 Deficiency of other specified B group vitamins: Secondary | ICD-10-CM

## 2018-04-17 MED ORDER — CYANOCOBALAMIN 1000 MCG/ML IJ SOLN
1000.0000 ug | Freq: Once | INTRAMUSCULAR | Status: AC
Start: 2018-04-17 — End: 2018-04-17
  Administered 2018-04-17: 1000 ug via INTRAMUSCULAR

## 2018-04-17 NOTE — Progress Notes (Addendum)
Patient came in today for B12 injection. Administered in Left Deltoid. Patient tolerated well with no complaints or concerns at this time.   Reviewed.  Dr Nicki Reaper

## 2018-05-06 ENCOUNTER — Other Ambulatory Visit: Payer: Self-pay | Admitting: Internal Medicine

## 2018-05-06 DIAGNOSIS — E7800 Pure hypercholesterolemia, unspecified: Secondary | ICD-10-CM

## 2018-05-06 DIAGNOSIS — I10 Essential (primary) hypertension: Secondary | ICD-10-CM

## 2018-05-06 DIAGNOSIS — K219 Gastro-esophageal reflux disease without esophagitis: Secondary | ICD-10-CM

## 2018-05-08 ENCOUNTER — Ambulatory Visit (INDEPENDENT_AMBULATORY_CARE_PROVIDER_SITE_OTHER): Payer: Medicare Other | Admitting: Internal Medicine

## 2018-05-08 ENCOUNTER — Encounter: Payer: Self-pay | Admitting: Internal Medicine

## 2018-05-08 DIAGNOSIS — F439 Reaction to severe stress, unspecified: Secondary | ICD-10-CM

## 2018-05-08 DIAGNOSIS — E041 Nontoxic single thyroid nodule: Secondary | ICD-10-CM

## 2018-05-08 DIAGNOSIS — K219 Gastro-esophageal reflux disease without esophagitis: Secondary | ICD-10-CM

## 2018-05-08 DIAGNOSIS — E538 Deficiency of other specified B group vitamins: Secondary | ICD-10-CM | POA: Diagnosis not present

## 2018-05-08 DIAGNOSIS — D649 Anemia, unspecified: Secondary | ICD-10-CM | POA: Diagnosis not present

## 2018-05-08 DIAGNOSIS — I10 Essential (primary) hypertension: Secondary | ICD-10-CM

## 2018-05-08 DIAGNOSIS — E7800 Pure hypercholesterolemia, unspecified: Secondary | ICD-10-CM

## 2018-05-08 DIAGNOSIS — Z0000 Encounter for general adult medical examination without abnormal findings: Secondary | ICD-10-CM | POA: Diagnosis not present

## 2018-05-08 DIAGNOSIS — C50919 Malignant neoplasm of unspecified site of unspecified female breast: Secondary | ICD-10-CM | POA: Diagnosis not present

## 2018-05-08 DIAGNOSIS — M546 Pain in thoracic spine: Secondary | ICD-10-CM | POA: Diagnosis not present

## 2018-05-08 DIAGNOSIS — R911 Solitary pulmonary nodule: Secondary | ICD-10-CM

## 2018-05-08 DIAGNOSIS — E119 Type 2 diabetes mellitus without complications: Secondary | ICD-10-CM

## 2018-05-08 MED ORDER — ACYCLOVIR 400 MG PO TABS: ORAL_TABLET | ORAL | 1 refills | Status: DC

## 2018-05-08 MED ORDER — LOSARTAN POTASSIUM 25 MG PO TABS: 25.0000 mg | ORAL_TABLET | Freq: Every day | ORAL | 1 refills | Status: DC

## 2018-05-08 NOTE — Progress Notes (Signed)
Patient ID: Sherry Simon, female   DOB: 1946/04/25, 72 y.o.   MRN: [id number]   Subjective:    Patient ID: Sherry Simon, female    DOB: [birth year], 72 y.o.   MRN: [id number]  HPI  Patient here for her physical exam.  She reports she is doing relatively well.  Still with increased stress.  Taking care of her mother and her husband. Also trying to cope with her health issues.  She is not taking buspar.  Did not feel it helped.  Overall she feels she is handling things relatively well.  No chest pain. Breathing stable.  Does report feeling a little light headed when she stands.  Does not occur on a regular basis.  States she is staying hydrated.  No headache.  No acid reflux.  No abdominal pain.  Bowels moving.  No urine change.  Does report lumbar pain and pain down left leg.  Sees neurosurgery. Takes hydrocodone.  Will try melatonin to help her sleep.  Has f/u chest CT planned.     Past Medical History:  Diagnosis Date  . Anxiety and depression   . Colon polyps   . Depression   . Diabetes mellitus ([place])   . Endometriosis    requiring hysterectomy  . History of chicken pox   . Hypercholesterolemia   . Hypertension   . Nephrolithiasis   . Pericarditis    recurrent, unkown origin  . Tachycardia    Past Surgical History:  Procedure Laterality Date  . ABDOMINAL HYSTERECTOMY  1980  . APPENDECTOMY  1055  . BACK SURGERY  1005-2010   laminectomy  . CHOLECYSTECTOMY     open  . OOPHORECTOMY  1982  . TONSILLECTOMY     Family History  Problem Relation Age of Onset  . Heart disease Father        myocardial infarction - died 25  . Thyroid disease Mother   . Transient ischemic attack Mother        multiple  . Breast cancer Mother   . Hyperlipidemia Mother   . Kidney disease Mother   . Diabetes Mother   . Rheumatic fever Sister        mitral valve problems  . Breast cancer Paternal Aunt   . Other Sister        Small vessel disease  . Colon cancer Neg Hx    Social  History   Socioeconomic History  . Marital status: Married    Spouse name: Not on file  . Number of children: 3  . Years of education: Not on file  . Highest education level: Not on file  Occupational History  . Not on file  Social Needs  . Financial resource strain: Not on file  . Food insecurity:    Worry: Not on file    Inability: Not on file  . Transportation needs:    Medical: Not on file    Non-medical: Not on file  Tobacco Use  . Smoking status: Never Smoker  . Smokeless tobacco: Never Used  Substance and Sexual Activity  . Alcohol use: No    Alcohol/week: 0.0 standard drinks  . Drug use: No  . Sexual activity: Never  Lifestyle  . Physical activity:    Days per week: Not on file    Minutes per session: Not on file  . Stress: Not on file  Relationships  . Social connections:    Talks on phone: Not on file    Gets together: Not  on file    Attends religious service: Not on file    Active member of club or organization: Not on file    Attends meetings of clubs or organizations: Not on file    Relationship status: Not on file  Other Topics Concern  . Not on file  Social History Narrative  . Not on file    Outpatient Encounter Medications as of 05/08/2018  Medication Sig  . gabapentin (NEURONTIN) 600 MG tablet Take 1-1/2 tabs by mouth daily during daytime, and 2 tabs by mouth qhs.  Marland Kitchen acyclovir (ZOVIRAX) 400 MG tablet Take one tablet daily  . aspirin 81 MG tablet Take 81 mg by mouth daily.  Marland Kitchen atorvastatin (LIPITOR) 40 MG tablet TAKE 1 TABLET BY MOUTH  DAILY  . Calcium Carbonate-Vitamin D 600-400 MG-UNIT tablet Take by mouth.  . citalopram (CELEXA) 40 MG tablet TAKE 1 TABLET BY MOUTH  DAILY  . ferrous sulfate 325 (65 FE) MG EC tablet Take 325 mg by mouth 3 (three) times daily with meals.  Marland Kitchen glucose blood (ONE TOUCH ULTRA TEST) test strip TEST BLOOD SUGAR TWICE A DAY  . HYDROcodone-acetaminophen (NORCO) 10-325 MG tablet TAKE 1 TABLET BY MOUTH THREE TIMES DAILY AS  NEEDED FOR CHRONIC PAIN  . latanoprost (XALATAN) 0.005 % ophthalmic solution INSTILL 1 DROP INTO THE  LEFT EYE AT BEDTIME  (REFRIGERATE UNTIL FIRST  OPENED FOR USE)  . losartan (COZAAR) 25 MG tablet Take 1 tablet (25 mg total) by mouth daily.  . metFORMIN (GLUCOPHAGE) 500 MG tablet Take 1 tablet by mouth 2 (two) times daily.  Marland Kitchen oxyCODONE (OXY IR/ROXICODONE) 5 MG immediate release tablet May take up 3 tablets at a time every 6 hours  . pantoprazole (PROTONIX) 40 MG tablet TAKE 1 TABLET BY MOUTH TWO  TIMES DAILY  . [DISCONTINUED] acyclovir (ZOVIRAX) 400 MG tablet Take one tablet daily  . [DISCONTINUED] busPIRone (BUSPAR) 5 MG tablet Take 1 tablet (5 mg total) by mouth daily as needed.  . [DISCONTINUED] gabapentin (NEURONTIN) 600 MG tablet TAKE 2 TABLETS BY MOUTH IN  THE MORNING AND 3 TABLETS  BY MOUTH IN THE EVENING  . [DISCONTINUED] losartan (COZAAR) 100 MG tablet TAKE 1 TABLET BY MOUTH  DAILY  . [DISCONTINUED] losartan (COZAAR) 100 MG tablet Take 0.5 tablets by mouth daily.  . [DISCONTINUED] losartan (COZAAR) 25 MG tablet Take 1 tablet (25 mg total) by mouth daily.  . [DISCONTINUED] metFORMIN (GLUCOPHAGE) 500 MG tablet TAKE 2 TABLETS BY MOUTH  EVERY MORNING AND TAKE 1  TABLET AT SUPPER  . [DISCONTINUED] morphine (MS CONTIN) 15 MG 12 hr tablet Take 15 mg by mouth every 12 (twelve) hours.   No facility-administered encounter medications on file as of 05/08/2018.     Review of Systems  Constitutional: Negative for appetite change and unexpected weight change.  HENT: Negative for congestion and sinus pressure.   Respiratory: Negative for cough, chest tightness and shortness of breath.   Cardiovascular: Negative for chest pain, palpitations and leg swelling.  Gastrointestinal: Negative for abdominal pain, diarrhea, nausea and vomiting.  Genitourinary: Negative for difficulty urinating and dysuria.  Musculoskeletal: Positive for back pain. Negative for joint swelling and myalgias.  Skin: Negative  for color change and rash.  Neurological: Positive for light-headedness. Negative for dizziness and headaches.  Psychiatric/Behavioral: Negative for agitation and dysphoric mood.       Increased stress as outlined.         Objective:    Physical Exam  Constitutional: She appears well-developed  and well-nourished. No distress.  HENT:  Nose: Nose normal.  Mouth/Throat: Oropharynx is clear and moist.  Neck: Neck supple. No thyromegaly present.  Cardiovascular: Normal rate and regular rhythm.  Pulmonary/Chest: Breath sounds normal. No respiratory distress. She has no wheezes.  Abdominal: Soft. Bowel sounds are normal. There is no tenderness.  Musculoskeletal: She exhibits no edema or tenderness.  Lymphadenopathy:    She has no cervical adenopathy.  Skin: No rash noted. No erythema.  Psychiatric: She has a normal mood and affect. Her behavior is normal.    BP 118/78   Pulse 69   Temp 97.9 F (36.6 C) (Oral)   Ht '5\' 4"'$  (1.626 m)   Wt 161 lb 12.8 oz (73.4 kg)   SpO2 98%   BMI 27.77 kg/m  Wt Readings from Last 3 Encounters:  05/08/18 161 lb 12.8 oz (73.4 kg)  02/06/18 164 lb 9.6 oz (74.7 kg)  10/24/17 164 lb 3.2 oz (74.5 kg)     Lab Results  Component Value Date   WBC 5.0 02/06/2018   HGB 11.5 (L) 02/06/2018   HCT 33.8 (L) 02/06/2018   PLT 273.0 02/06/2018   GLUCOSE 116 (H) 02/06/2018   CHOL 163 06/04/2017   TRIG 91.0 06/04/2017   HDL 56.30 06/04/2017   LDLCALC 89 06/04/2017   ALT 12 02/06/2018   AST 13 02/06/2018   NA 137 02/06/2018   K 4.0 02/06/2018   CL 102 02/06/2018   CREATININE 0.78 02/06/2018   BUN 14 02/06/2018   CO2 27 02/06/2018   TSH 1.50 02/06/2018   HGBA1C 6.2 02/06/2018   MICROALBUR <0.7 06/04/2017    Ct Chest Wo Contrast  Result Date: 07/31/2017 CLINICAL DATA:  Intermittent left chest pain for the past 6 weeks. History of right breast surgery for breast cancer. EXAM: CT CHEST WITHOUT CONTRAST TECHNIQUE: Multidetector CT imaging of the chest  was performed following the standard protocol without IV contrast. COMPARISON:  Chest radiographs dated 07/25/2015 FINDINGS: Cardiovascular: Atheromatous coronary artery calcifications. Normal sized heart. Mediastinum/Nodes: No enlarged mediastinal or axillary lymph nodes. Thyroid gland, trachea, and esophagus demonstrate no significant findings. Lungs/Pleura: 9 mm rounded sub solid nodular opacity in the superior aspect of the superior segment of the left lower lobe. Minimal linear scarring bilaterally. No pleural fluid. Upper Abdomen: Cholecystectomy clips. Musculoskeletal: Thoracic and lower cervical spine degenerative changes. Right breast post lumpectomy changes. IMPRESSION: 1. 9 mm ground-glass nodule in the superior segment of the left lower lobe. Initial follow-up with CT at 6-12 months is recommended to confirm persistence. If persistent, repeat CT is recommended every 2 years until 5 years of stability has been established. This recommendation follows the consensus statement: Guidelines for Management of Incidental Pulmonary Nodules Detected on CT Images: From the Fleischner Society 2017; Radiology 2017; 284:228-243. 2. Otherwise, unremarkable examination. Electronically Signed   By: Claudie Revering M.D.   On: 07/31/2017 10:10       Assessment & Plan:   Problem List Items Addressed This Visit    Anemia    Follow cbc.       Relevant Orders   CBC with Differential/Platelet   Ferritin   B12 deficiency    Continue B12 injections.        Relevant Orders   Vitamin B12   Back pain    Chronic back pain.  Seeing neurosurgery.  Taking hydrocodone.       Breast cancer ([place])    Followed by oncology.        Relevant Medications  acyclovir (ZOVIRAX) 400 MG tablet   Diabetes mellitus ([facility])    Low carb diet and exercise.  Follow met b and a1c.  Sugars have been controlled.  Follow.        Relevant Medications   metFORMIN (GLUCOPHAGE) 500 MG tablet   losartan (COZAAR) 25 MG tablet   Other  Relevant Orders   Hemoglobin J6G   Basic metabolic panel   GERD (gastroesophageal reflux disease)    Controlled on current regimen.  Follow.        Health care maintenance    Physical today.  Colonoscopy 07/15/17 - recommended f/u colonoscopy in 5 years.  Mammogram 01/27/18 - Birads II.        Hypercholesterolemia    On lipitor.  Low cholesterol diet and exercise.  Follow lipid panel and liver function tests.        Relevant Medications   losartan (COZAAR) 25 MG tablet   Other Relevant Orders   Hepatic function panel   Lipid panel   Hypertension    Blood pressure under good control.  Continue same medication regimen.  Follow pressures.  Follow metabolic panel.        Relevant Medications   losartan (COZAAR) 25 MG tablet   Lung nodule    Being followed by chest CT.  Scheduled for f/u chest CT.        Stress    Increased stress as outlined.  Discussed with her today.  Will try melatonin to help her sleep.  Follow.        Thyroid nodule    S/p benign biopsy.  Follow tsh.            Einar Pheasant, MD

## 2018-05-08 NOTE — Assessment & Plan Note (Addendum)
Physical today.  Colonoscopy 07/15/17 - recommended f/u colonoscopy in 5 years.  Mammogram 01/27/18 - Birads II.

## 2018-05-11 ENCOUNTER — Encounter: Payer: Self-pay | Admitting: Internal Medicine

## 2018-05-11 NOTE — Assessment & Plan Note (Signed)
Chronic back pain.  Seeing neurosurgery.  Taking hydrocodone.

## 2018-05-11 NOTE — Assessment & Plan Note (Signed)
Blood pressure under good control.  Continue same medication regimen.  Follow pressures.  Follow metabolic panel.   

## 2018-05-11 NOTE — Assessment & Plan Note (Signed)
Continue B12 injections.   

## 2018-05-11 NOTE — Assessment & Plan Note (Signed)
S/p benign biopsy.  Follow tsh.  

## 2018-05-11 NOTE — Assessment & Plan Note (Signed)
Follow cbc.  

## 2018-05-11 NOTE — Assessment & Plan Note (Signed)
Followed by oncology 

## 2018-05-11 NOTE — Assessment & Plan Note (Signed)
On lipitor.  Low cholesterol diet and exercise.  Follow lipid panel and liver function tests.   

## 2018-05-11 NOTE — Assessment & Plan Note (Signed)
Low carb diet and exercise.  Follow met b and a1c.  Sugars have been controlled.  Follow.

## 2018-05-11 NOTE — Assessment & Plan Note (Signed)
Controlled on current regimen.  Follow.  

## 2018-05-11 NOTE — Assessment & Plan Note (Signed)
Increased stress as outlined.  Discussed with her today.  Will try melatonin to help her sleep.  Follow.

## 2018-05-11 NOTE — Assessment & Plan Note (Signed)
Being followed by chest CT.  Scheduled for f/u chest CT.

## 2018-05-14 ENCOUNTER — Other Ambulatory Visit (INDEPENDENT_AMBULATORY_CARE_PROVIDER_SITE_OTHER): Payer: Medicare Other

## 2018-05-14 DIAGNOSIS — E119 Type 2 diabetes mellitus without complications: Secondary | ICD-10-CM | POA: Diagnosis not present

## 2018-05-14 DIAGNOSIS — E7800 Pure hypercholesterolemia, unspecified: Secondary | ICD-10-CM | POA: Diagnosis not present

## 2018-05-14 DIAGNOSIS — D649 Anemia, unspecified: Secondary | ICD-10-CM

## 2018-05-14 DIAGNOSIS — E538 Deficiency of other specified B group vitamins: Secondary | ICD-10-CM | POA: Diagnosis not present

## 2018-05-14 LAB — HEMOGLOBIN A1C: HEMOGLOBIN A1C: 6.1 % (ref 4.6–6.5)

## 2018-05-14 LAB — LIPID PANEL
CHOLESTEROL: 128 mg/dL (ref 0–200)
HDL: 48.7 mg/dL (ref >39.00)
LDL Cholesterol: 63 mg/dL (ref 0–99)
NonHDL: 79.24
TRIGLYCERIDES: 83 mg/dL (ref 0.0–149.0)
Total CHOL/HDL Ratio: 3
VLDL: 16.6 mg/dL (ref 0.0–40.0)

## 2018-05-14 LAB — BASIC METABOLIC PANEL
BUN: 11 mg/dL (ref 6–23)
CALCIUM: 9.9 mg/dL (ref 8.4–10.5)
CO2: 26 mEq/L (ref 19–32)
Chloride: 104 mEq/L (ref 96–112)
Creatinine, Ser: 0.65 mg/dL (ref 0.40–1.20)
GFR: 95.18 mL/min (ref >60.00)
Glucose, Bld: 102 mg/dL — ABNORMAL HIGH (ref 70–99)
POTASSIUM: 4.4 meq/L (ref 3.5–5.1)
SODIUM: 138 meq/L (ref 135–145)

## 2018-05-14 LAB — CBC WITH DIFFERENTIAL/PLATELET
BASOS ABS: 0 10*3/uL (ref 0.0–0.1)
Basophils Relative: 0.6 % (ref 0.0–3.0)
Eosinophils Absolute: 0.1 10*3/uL (ref 0.0–0.7)
Eosinophils Relative: 1.4 % (ref 0.0–5.0)
HCT: 36.2 % (ref 36.0–46.0)
Hemoglobin: 12.3 g/dL (ref 12.0–15.0)
LYMPHS ABS: 1.5 10*3/uL (ref 0.7–4.0)
Lymphocytes Relative: 34.4 % (ref 12.0–46.0)
MCHC: 33.9 g/dL (ref 30.0–36.0)
MCV: 93.2 fl (ref 78.0–100.0)
MONO ABS: 0.4 10*3/uL (ref 0.1–1.0)
Monocytes Relative: 9.3 % (ref 3.0–12.0)
NEUTROS ABS: 2.3 10*3/uL (ref 1.4–7.7)
NEUTROS PCT: 54.3 % (ref 43.0–77.0)
PLATELETS: 307 10*3/uL (ref 150.0–400.0)
RBC: 3.88 Mil/uL (ref 3.87–5.11)
RDW: 13.1 % (ref 11.5–15.5)
WBC: 4.3 10*3/uL (ref 4.0–10.5)

## 2018-05-14 LAB — HEPATIC FUNCTION PANEL
ALK PHOS: 43 U/L (ref 39–117)
ALT: 12 U/L (ref 0–35)
AST: 10 U/L (ref 0–37)
Albumin: 4.5 g/dL (ref 3.5–5.2)
BILIRUBIN DIRECT: 0.2 mg/dL (ref 0.0–0.3)
Total Bilirubin: 1.6 mg/dL — ABNORMAL HIGH (ref 0.2–1.2)
Total Protein: 6.7 g/dL (ref 6.0–8.3)

## 2018-05-14 LAB — VITAMIN B12: VITAMIN B 12: 394 pg/mL (ref 211–911)

## 2018-05-14 LAB — FERRITIN: Ferritin: 44.4 ng/mL (ref 10.0–291.0)

## 2018-05-20 ENCOUNTER — Ambulatory Visit (INDEPENDENT_AMBULATORY_CARE_PROVIDER_SITE_OTHER): Payer: Medicare Other

## 2018-05-20 DIAGNOSIS — E538 Deficiency of other specified B group vitamins: Secondary | ICD-10-CM | POA: Diagnosis not present

## 2018-05-20 MED ORDER — CYANOCOBALAMIN 1000 MCG/ML IJ SOLN
1000.0000 ug | Freq: Once | INTRAMUSCULAR | Status: AC
  Administered 2018-05-20: 1000 ug via INTRAMUSCULAR

## 2018-05-20 NOTE — Progress Notes (Addendum)
Patient presented for B 12 injection to left deltoid, patient voiced no concerns nor showed any signs of distress during injection.  Reviewed.  Dr Scott 

## 2018-06-05 ENCOUNTER — Other Ambulatory Visit: Payer: Self-pay | Admitting: Internal Medicine

## 2018-06-09 DIAGNOSIS — M5030 Other cervical disc degeneration, unspecified cervical region: Secondary | ICD-10-CM | POA: Diagnosis not present

## 2018-06-09 DIAGNOSIS — R2689 Other abnormalities of gait and mobility: Secondary | ICD-10-CM | POA: Diagnosis not present

## 2018-06-09 DIAGNOSIS — M5136 Other intervertebral disc degeneration, lumbar region: Secondary | ICD-10-CM | POA: Diagnosis not present

## 2018-06-09 DIAGNOSIS — M5416 Radiculopathy, lumbar region: Secondary | ICD-10-CM | POA: Diagnosis not present

## 2018-06-09 DIAGNOSIS — M4802 Spinal stenosis, cervical region: Secondary | ICD-10-CM | POA: Diagnosis not present

## 2018-06-09 DIAGNOSIS — R292 Abnormal reflex: Secondary | ICD-10-CM | POA: Diagnosis not present

## 2018-06-09 DIAGNOSIS — G894 Chronic pain syndrome: Secondary | ICD-10-CM | POA: Diagnosis not present

## 2018-06-24 ENCOUNTER — Ambulatory Visit: Payer: Medicare Other

## 2018-07-05 ENCOUNTER — Other Ambulatory Visit: Payer: Self-pay | Admitting: Internal Medicine

## 2018-07-09 ENCOUNTER — Ambulatory Visit: Payer: Medicare Other | Admitting: Internal Medicine

## 2018-07-17 ENCOUNTER — Other Ambulatory Visit: Payer: Self-pay | Admitting: Internal Medicine

## 2018-07-18 ENCOUNTER — Ambulatory Visit (INDEPENDENT_AMBULATORY_CARE_PROVIDER_SITE_OTHER): Payer: Medicare Other | Admitting: Internal Medicine

## 2018-07-18 VITALS — BP 128/70 | HR 77 | Temp 97.8°F | Resp 18 | Wt 161.2 lb

## 2018-07-18 DIAGNOSIS — E538 Deficiency of other specified B group vitamins: Secondary | ICD-10-CM | POA: Diagnosis not present

## 2018-07-18 DIAGNOSIS — C50919 Malignant neoplasm of unspecified site of unspecified female breast: Secondary | ICD-10-CM

## 2018-07-18 DIAGNOSIS — R079 Chest pain, unspecified: Secondary | ICD-10-CM

## 2018-07-18 DIAGNOSIS — D649 Anemia, unspecified: Secondary | ICD-10-CM | POA: Diagnosis not present

## 2018-07-18 DIAGNOSIS — K219 Gastro-esophageal reflux disease without esophagitis: Secondary | ICD-10-CM

## 2018-07-18 DIAGNOSIS — E7800 Pure hypercholesterolemia, unspecified: Secondary | ICD-10-CM

## 2018-07-18 DIAGNOSIS — E119 Type 2 diabetes mellitus without complications: Secondary | ICD-10-CM | POA: Diagnosis not present

## 2018-07-18 DIAGNOSIS — R911 Solitary pulmonary nodule: Secondary | ICD-10-CM

## 2018-07-18 DIAGNOSIS — I10 Essential (primary) hypertension: Secondary | ICD-10-CM

## 2018-07-18 DIAGNOSIS — M546 Pain in thoracic spine: Secondary | ICD-10-CM | POA: Diagnosis not present

## 2018-07-18 DIAGNOSIS — F439 Reaction to severe stress, unspecified: Secondary | ICD-10-CM

## 2018-07-18 MED ORDER — CYANOCOBALAMIN 1000 MCG/ML IJ SOLN
1000.0000 ug | Freq: Once | INTRAMUSCULAR | Status: AC
  Administered 2018-07-18: 1000 ug via INTRAMUSCULAR

## 2018-07-18 NOTE — Progress Notes (Signed)
Patient ID: Sherry Simon, female   DOB: 06-25-1946, 72 y.o.   MRN: [id number]   Subjective:    Patient ID: Sherry Simon, female    DOB: 10-Dec-1945, 72 y.o.   MRN: [id number]  HPI  Patient here for a scheduled follow up.  She reports persistent increased stress.  Taking care of her mother and her husband who has MS.  Mother lives with her.  Discussed with her today.  Desires no further intervention.  Has been having some persistent back pain.  Had been doing a lot of pushing and pulling.  Followed by neurosurgery.  Has f/u with her oncologist next month.  Planning for f/u chest CT.  States had some chest pain.  States feels related to acid reflux.  Occurred while off protonix.  When takes protonix, no symptoms.  No chest pain with increased activity or exertion.  Discussed further cardiac w/up.  She declines.  No abdominal pain.  Bowels moving.     Past Medical History:  Diagnosis Date  . Anxiety and depression   . Colon polyps   . Depression   . Diabetes mellitus ([place])   . Endometriosis    requiring hysterectomy  . History of chicken pox   . Hypercholesterolemia   . Hypertension   . Nephrolithiasis   . Pericarditis    recurrent, unkown origin  . Tachycardia    Past Surgical History:  Procedure Laterality Date  . ABDOMINAL HYSTERECTOMY  1980  . APPENDECTOMY  1055  . BACK SURGERY  1005-2010   laminectomy  . CHOLECYSTECTOMY     open  . OOPHORECTOMY  1982  . TONSILLECTOMY     Family History  Problem Relation Age of Onset  . Heart disease Father        myocardial infarction - died 69  . Thyroid disease Mother   . Transient ischemic attack Mother        multiple  . Breast cancer Mother   . Hyperlipidemia Mother   . Kidney disease Mother   . Diabetes Mother   . Rheumatic fever Sister        mitral valve problems  . Breast cancer Paternal Aunt   . Other Sister        Small vessel disease  . Colon cancer Neg Hx    Social History   Socioeconomic History  .  Marital status: Married    Spouse name: Not on file  . Number of children: 3  . Years of education: Not on file  . Highest education level: Not on file  Occupational History  . Not on file  Social Needs  . Financial resource strain: Not on file  . Food insecurity:    Worry: Not on file    Inability: Not on file  . Transportation needs:    Medical: Not on file    Non-medical: Not on file  Tobacco Use  . Smoking status: Never Smoker  . Smokeless tobacco: Never Used  Substance and Sexual Activity  . Alcohol use: No    Alcohol/week: 0.0 standard drinks  . Drug use: No  . Sexual activity: Never  Lifestyle  . Physical activity:    Days per week: Not on file    Minutes per session: Not on file  . Stress: Not on file  Relationships  . Social connections:    Talks on phone: Not on file    Gets together: Not on file    Attends religious service: Not on file  Active member of club or organization: Not on file    Attends meetings of clubs or organizations: Not on file    Relationship status: Not on file  Other Topics Concern  . Not on file  Social History Narrative  . Not on file    Outpatient Encounter Medications as of 07/18/2018  Medication Sig  . acyclovir (ZOVIRAX) 400 MG tablet Take one tablet daily  . aspirin 81 MG tablet Take 81 mg by mouth daily.  Marland Kitchen atorvastatin (LIPITOR) 40 MG tablet TAKE 1 TABLET BY MOUTH  DAILY  . Calcium Carbonate-Vitamin D 600-400 MG-UNIT tablet Take by mouth.  . citalopram (CELEXA) 40 MG tablet TAKE 1 TABLET BY MOUTH  DAILY  . ferrous sulfate 325 (65 FE) MG EC tablet Take 325 mg by mouth 3 (three) times daily with meals.  . gabapentin (NEURONTIN) 600 MG tablet Take 1-1/2 tabs by mouth daily during daytime, and 2 tabs by mouth qhs.  Marland Kitchen glucose blood (ONE TOUCH ULTRA TEST) test strip TEST BLOOD SUGAR TWICE A DAY  . HYDROcodone-acetaminophen (NORCO) 10-325 MG tablet TAKE 1 TABLET BY MOUTH THREE TIMES DAILY AS NEEDED FOR CHRONIC PAIN  .  latanoprost (XALATAN) 0.005 % ophthalmic solution INSTILL 1 DROP INTO THE  LEFT EYE AT BEDTIME  (REFRIGERATE UNTIL FIRST  OPENED FOR USE)  . metFORMIN (GLUCOPHAGE) 500 MG tablet Take 1 tablet by mouth 2 (two) times daily.  Marland Kitchen oxyCODONE (OXY IR/ROXICODONE) 5 MG immediate release tablet May take up 3 tablets at a time every 6 hours  . pantoprazole (PROTONIX) 40 MG tablet TAKE 1 TABLET BY MOUTH TWO  TIMES DAILY  . [DISCONTINUED] losartan (COZAAR) 25 MG tablet Take 1 tablet (25 mg total) by mouth daily.  . [DISCONTINUED] losartan (COZAAR) 25 MG tablet TAKE 1 TABLET BY MOUTH EVERY DAY  . [EXPIRED] cyanocobalamin ((VITAMIN B-12)) injection 1,000 mcg    No facility-administered encounter medications on file as of 07/18/2018.     Review of Systems  Constitutional: Negative for appetite change and unexpected weight change.  HENT: Negative for congestion and sinus pressure.   Respiratory: Negative for cough and chest tightness.        Breathing overall stable.    Cardiovascular: Negative for palpitations and leg swelling.       Some chest pain.  She associates with acid reflux.  Occurred off protonix.    Gastrointestinal: Negative for abdominal pain, diarrhea, nausea and vomiting.  Genitourinary: Negative for difficulty urinating and dysuria.  Musculoskeletal: Negative for joint swelling and myalgias.       Persistent back pain.    Skin: Negative for color change and rash.  Neurological: Negative for dizziness, light-headedness and headaches.  Psychiatric/Behavioral: Negative for agitation and dysphoric mood.       Objective:    Physical Exam  Constitutional: She appears well-developed and well-nourished. No distress.  HENT:  Nose: Nose normal.  Mouth/Throat: Oropharynx is clear and moist.  Neck: Neck supple. No thyromegaly present.  Cardiovascular: Normal rate and regular rhythm.  Pulmonary/Chest: Breath sounds normal. No respiratory distress. She has no wheezes.  Abdominal: Soft. Bowel  sounds are normal. There is no tenderness.  Musculoskeletal: She exhibits no edema or tenderness.  Lymphadenopathy:    She has no cervical adenopathy.  Skin: No rash noted. No erythema.  Psychiatric: She has a normal mood and affect. Her behavior is normal.    BP 128/70 (BP Location: Left Arm, Patient Position: Sitting, Cuff Size: Normal)   Pulse 77   Temp  97.8 F (36.6 C) (Oral)   Resp 18   Wt 161 lb 3.2 oz (73.1 kg)   SpO2 98%   BMI 27.67 kg/m  Wt Readings from Last 3 Encounters:  07/18/18 161 lb 3.2 oz (73.1 kg)  05/08/18 161 lb 12.8 oz (73.4 kg)  02/06/18 164 lb 9.6 oz (74.7 kg)     Lab Results  Component Value Date   WBC 4.3 05/14/2018   HGB 12.3 05/14/2018   HCT 36.2 05/14/2018   PLT 307.0 05/14/2018   GLUCOSE 102 (H) 05/14/2018   CHOL 128 05/14/2018   TRIG 83.0 05/14/2018   HDL 48.70 05/14/2018   LDLCALC 63 05/14/2018   ALT 12 05/14/2018   AST 10 05/14/2018   NA 138 05/14/2018   K 4.4 05/14/2018   CL 104 05/14/2018   CREATININE 0.65 05/14/2018   BUN 11 05/14/2018   CO2 26 05/14/2018   TSH 1.50 02/06/2018   HGBA1C 6.1 05/14/2018   MICROALBUR <0.7 06/04/2017    Ct Chest Wo Contrast  Result Date: 07/31/2017 CLINICAL DATA:  Intermittent left chest pain for the past 6 weeks. History of right breast surgery for breast cancer. EXAM: CT CHEST WITHOUT CONTRAST TECHNIQUE: Multidetector CT imaging of the chest was performed following the standard protocol without IV contrast. COMPARISON:  Chest radiographs dated 07/25/2015 FINDINGS: Cardiovascular: Atheromatous coronary artery calcifications. Normal sized heart. Mediastinum/Nodes: No enlarged mediastinal or axillary lymph nodes. Thyroid gland, trachea, and esophagus demonstrate no significant findings. Lungs/Pleura: 9 mm rounded sub solid nodular opacity in the superior aspect of the superior segment of the left lower lobe. Minimal linear scarring bilaterally. No pleural fluid. Upper Abdomen: Cholecystectomy clips.  Musculoskeletal: Thoracic and lower cervical spine degenerative changes. Right breast post lumpectomy changes. IMPRESSION: 1. 9 mm ground-glass nodule in the superior segment of the left lower lobe. Initial follow-up with CT at 6-12 months is recommended to confirm persistence. If persistent, repeat CT is recommended every 2 years until 5 years of stability has been established. This recommendation follows the consensus statement: Guidelines for Management of Incidental Pulmonary Nodules Detected on CT Images: From the Fleischner Society 2017; Radiology 2017; 284:[id number]. 2. Otherwise, unremarkable examination. Electronically Signed   By: Claudie Revering M.D.   On: 07/31/2017 10:10       Assessment & Plan:   Problem List Items Addressed This Visit    Anemia    Follow cbc.       Relevant Medications   cyanocobalamin ((VITAMIN B-12)) injection 1,000 mcg (Completed)   B12 deficiency - Primary    Continue b12 injections.        Relevant Medications   cyanocobalamin ((VITAMIN B-12)) injection 1,000 mcg (Completed)   Back pain    Chronic back pain.  Seeing neurosurgery.  Taking hydrocodone.  Follow.        Breast cancer ([place])    Followed by oncology.       Chest pain    Described chest pain as outlined.  Relates to acid reflux.  Restart protonix.  No chest pain with increased activity or exertion.  She declines further cardiac w/up.        Diabetes mellitus ([place])    Low carb diet and exercise.  Follow met b and a1c.        Relevant Orders   Hemoglobin F6B   Basic metabolic panel   GERD (gastroesophageal reflux disease)    Off protonix, reflux.  Chest pain associated.  Restart protonix.  Follow.  Will notify me if  break through symptoms.        Hypercholesterolemia    On lipitor.  Low cholesterol diet and exercise.  Follow lipid panel and liver function tests.        Relevant Orders   Hepatic function panel   Lipid panel   Hypertension    Blood pressure under good control.   Continue same medication regimen.  Follow pressures.  Follow metabolic panel.        Lung nodule    Planning for f/u chest CT - followed by oncology.        Stress    Increased stress as outlined.  Discussed at length with her today.  On effexor.  Tried melatonin.  Discussed her sleeping issues.  Follow.  Desires no further intervention.            Einar Pheasant, MD

## 2018-07-20 ENCOUNTER — Encounter: Payer: Self-pay | Admitting: Internal Medicine

## 2018-07-20 NOTE — Assessment & Plan Note (Signed)
Off protonix, reflux.  Chest pain associated.  Restart protonix.  Follow.  Will notify me if break through symptoms.

## 2018-07-20 NOTE — Assessment & Plan Note (Signed)
Increased stress as outlined.  Discussed at length with her today.  On effexor.  Tried melatonin.  Discussed her sleeping issues.  Follow.  Desires no further intervention.

## 2018-07-20 NOTE — Assessment & Plan Note (Signed)
Described chest pain as outlined.  Relates to acid reflux.  Restart protonix.  No chest pain with increased activity or exertion.  She declines further cardiac w/up.

## 2018-07-20 NOTE — Assessment & Plan Note (Signed)
Followed by oncology 

## 2018-07-20 NOTE — Assessment & Plan Note (Signed)
Continue b12 injections.  

## 2018-07-20 NOTE — Assessment & Plan Note (Signed)
Planning for f/u chest CT - followed by oncology.

## 2018-07-20 NOTE — Assessment & Plan Note (Signed)
Follow cbc.  

## 2018-07-20 NOTE — Assessment & Plan Note (Signed)
Low carb diet and exercise.  Follow met b and a1c.   

## 2018-07-20 NOTE — Assessment & Plan Note (Signed)
Blood pressure under good control.  Continue same medication regimen.  Follow pressures.  Follow metabolic panel.   

## 2018-07-20 NOTE — Assessment & Plan Note (Signed)
On lipitor.  Low cholesterol diet and exercise.  Follow lipid panel and liver function tests.   

## 2018-07-20 NOTE — Assessment & Plan Note (Signed)
Chronic back pain.  Seeing neurosurgery.  Taking hydrocodone.  Follow.

## 2018-07-31 DIAGNOSIS — H3322 Serous retinal detachment, left eye: Secondary | ICD-10-CM | POA: Diagnosis not present

## 2018-07-31 DIAGNOSIS — H35052 Retinal neovascularization, unspecified, left eye: Secondary | ICD-10-CM | POA: Diagnosis not present

## 2018-07-31 DIAGNOSIS — H43813 Vitreous degeneration, bilateral: Secondary | ICD-10-CM | POA: Diagnosis not present

## 2018-07-31 DIAGNOSIS — H35372 Puckering of macula, left eye: Secondary | ICD-10-CM | POA: Diagnosis not present

## 2018-07-31 DIAGNOSIS — E119 Type 2 diabetes mellitus without complications: Secondary | ICD-10-CM | POA: Diagnosis not present

## 2018-08-19 ENCOUNTER — Ambulatory Visit (INDEPENDENT_AMBULATORY_CARE_PROVIDER_SITE_OTHER): Payer: Medicare Other

## 2018-08-19 DIAGNOSIS — E538 Deficiency of other specified B group vitamins: Secondary | ICD-10-CM

## 2018-08-19 MED ORDER — CYANOCOBALAMIN 1000 MCG/ML IJ SOLN
1000.0000 ug | Freq: Once | INTRAMUSCULAR | Status: AC
  Administered 2018-08-19: 1000 ug via INTRAMUSCULAR

## 2018-08-19 NOTE — Progress Notes (Addendum)
Patient comes in for B 12 injection.  Injected left deltoid.  Patient tolerated injection well.   Reviewed.  Dr Scott 

## 2018-09-15 DIAGNOSIS — M5136 Other intervertebral disc degeneration, lumbar region: Secondary | ICD-10-CM | POA: Diagnosis not present

## 2018-09-15 DIAGNOSIS — G894 Chronic pain syndrome: Secondary | ICD-10-CM | POA: Diagnosis not present

## 2018-09-15 DIAGNOSIS — M5416 Radiculopathy, lumbar region: Secondary | ICD-10-CM | POA: Diagnosis not present

## 2018-09-25 ENCOUNTER — Encounter: Payer: Self-pay | Admitting: Internal Medicine

## 2018-09-25 ENCOUNTER — Ambulatory Visit (INDEPENDENT_AMBULATORY_CARE_PROVIDER_SITE_OTHER): Payer: Medicare Other | Admitting: Internal Medicine

## 2018-09-25 VITALS — BP 118/70 | HR 69 | Temp 97.8°F | Resp 16 | Wt 157.2 lb

## 2018-09-25 DIAGNOSIS — M546 Pain in thoracic spine: Secondary | ICD-10-CM

## 2018-09-25 DIAGNOSIS — E538 Deficiency of other specified B group vitamins: Secondary | ICD-10-CM | POA: Diagnosis not present

## 2018-09-25 DIAGNOSIS — E7800 Pure hypercholesterolemia, unspecified: Secondary | ICD-10-CM

## 2018-09-25 DIAGNOSIS — R911 Solitary pulmonary nodule: Secondary | ICD-10-CM

## 2018-09-25 DIAGNOSIS — F439 Reaction to severe stress, unspecified: Secondary | ICD-10-CM

## 2018-09-25 DIAGNOSIS — C50919 Malignant neoplasm of unspecified site of unspecified female breast: Secondary | ICD-10-CM

## 2018-09-25 DIAGNOSIS — E119 Type 2 diabetes mellitus without complications: Secondary | ICD-10-CM

## 2018-09-25 DIAGNOSIS — I10 Essential (primary) hypertension: Secondary | ICD-10-CM

## 2018-09-25 DIAGNOSIS — D649 Anemia, unspecified: Secondary | ICD-10-CM

## 2018-09-25 DIAGNOSIS — K219 Gastro-esophageal reflux disease without esophagitis: Secondary | ICD-10-CM

## 2018-09-25 MED ORDER — CYANOCOBALAMIN 1000 MCG/ML IJ SOLN
1000.0000 ug | Freq: Once | INTRAMUSCULAR | Status: AC
  Administered 2018-09-25: 1000 ug via INTRAMUSCULAR

## 2018-09-25 NOTE — Progress Notes (Signed)
Patient ID: Sherry Simon, female   DOB: [birth year], 73 y.o.   MRN: [id number]   Subjective:    Patient ID: Sherry Simon, female    DOB: 09/01/1946, 73 y.o.   MRN: [id number]  HPI  Patient here for a scheduled follow up.   She reports she is doing relatively well.  Still with back pain.  Seeing neurosurgery.  Still with increased stress.  Mother recently passed away.  She feels she is doing better and feels she is handling things relatively well.  She is seeing oncology.  Is going to reschedule her CT chest and f/u appt with oncology.  No chest pain.  No sob.  No acid reflux.  No abdominal pain.  Bowels moving.  Sleeping better.     Past Medical History:  Diagnosis Date  . Anxiety and depression   . Colon polyps   . Depression   . Diabetes mellitus ([place])   . Endometriosis    requiring hysterectomy  . History of chicken pox   . Hypercholesterolemia   . Hypertension   . Nephrolithiasis   . Pericarditis    recurrent, unkown origin  . Tachycardia    Past Surgical History:  Procedure Laterality Date  . ABDOMINAL HYSTERECTOMY  1980  . APPENDECTOMY  1055  . BACK SURGERY  1005-2010   laminectomy  . CHOLECYSTECTOMY     open  . OOPHORECTOMY  1982  . TONSILLECTOMY     Family History  Problem Relation Age of Onset  . Heart disease Father        myocardial infarction - died 80  . Thyroid disease Mother   . Transient ischemic attack Mother        multiple  . Breast cancer Mother   . Hyperlipidemia Mother   . Kidney disease Mother   . Diabetes Mother   . Rheumatic fever Sister        mitral valve problems  . Breast cancer Paternal Aunt   . Other Sister        Small vessel disease  . Colon cancer Neg Hx    Social History   Socioeconomic History  . Marital status: Married    Spouse name: Not on file  . Number of children: 3  . Years of education: Not on file  . Highest education level: Not on file  Occupational History  . Not on file  Social Needs  .  Financial resource strain: Not on file  . Food insecurity:    Worry: Not on file    Inability: Not on file  . Transportation needs:    Medical: Not on file    Non-medical: Not on file  Tobacco Use  . Smoking status: Never Smoker  . Smokeless tobacco: Never Used  Substance and Sexual Activity  . Alcohol use: No    Alcohol/week: 0.0 standard drinks  . Drug use: No  . Sexual activity: Never  Lifestyle  . Physical activity:    Days per week: Not on file    Minutes per session: Not on file  . Stress: Not on file  Relationships  . Social connections:    Talks on phone: Not on file    Gets together: Not on file    Attends religious service: Not on file    Active member of club or organization: Not on file    Attends meetings of clubs or organizations: Not on file    Relationship status: Not on file  Other Topics Concern  .  Not on file  Social History Narrative  . Not on file    Outpatient Encounter Medications as of 09/25/2018  Medication Sig  . acyclovir (ZOVIRAX) 400 MG tablet Take one tablet daily  . aspirin 81 MG tablet Take 81 mg by mouth daily.  Marland Kitchen atorvastatin (LIPITOR) 40 MG tablet TAKE 1 TABLET BY MOUTH  DAILY  . Calcium Carbonate-Vitamin D 600-400 MG-UNIT tablet Take by mouth.  . citalopram (CELEXA) 40 MG tablet TAKE 1 TABLET BY MOUTH  DAILY  . ferrous sulfate 325 (65 FE) MG EC tablet Take 325 mg by mouth daily with breakfast.   . gabapentin (NEURONTIN) 600 MG tablet Take 600 mg by mouth 2 (two) times daily.   Marland Kitchen glucose blood (ONE TOUCH ULTRA TEST) test strip TEST BLOOD SUGAR TWICE A DAY  . HYDROcodone-acetaminophen (NORCO) 10-325 MG tablet TAKE 1 TABLET BY MOUTH THREE TIMES DAILY AS NEEDED FOR CHRONIC PAIN  . latanoprost (XALATAN) 0.005 % ophthalmic solution INSTILL 1 DROP INTO THE  LEFT EYE AT BEDTIME  (REFRIGERATE UNTIL FIRST  OPENED FOR USE) (Patient taking differently: Place 1 drop into both eyes. )  . metFORMIN (GLUCOPHAGE) 500 MG tablet Take 1 tablet by mouth 2  (two) times daily.  . pantoprazole (PROTONIX) 40 MG tablet TAKE 1 TABLET BY MOUTH TWO  TIMES DAILY  . [DISCONTINUED] oxyCODONE (OXY IR/ROXICODONE) 5 MG immediate release tablet May take up 3 tablets at a time every 6 hours  . [EXPIRED] cyanocobalamin ((VITAMIN B-12)) injection 1,000 mcg    No facility-administered encounter medications on file as of 09/25/2018.     Review of Systems  Constitutional: Negative for appetite change and unexpected weight change.  HENT: Negative for congestion and sinus pressure.   Respiratory: Negative for cough, chest tightness and shortness of breath.   Cardiovascular: Negative for chest pain, palpitations and leg swelling.  Gastrointestinal: Negative for abdominal pain, diarrhea, nausea and vomiting.  Genitourinary: Negative for difficulty urinating and dysuria.  Musculoskeletal: Positive for back pain. Negative for joint swelling.  Skin: Negative for color change and rash.  Neurological: Negative for dizziness, light-headedness and headaches.  Psychiatric/Behavioral: Negative for agitation and dysphoric mood.       Increased stress as outlined.         Objective:    Physical Exam Constitutional:      General: She is not in acute distress.    Appearance: Normal appearance.  HENT:     Nose: Nose normal. No congestion.     Mouth/Throat:     Pharynx: No oropharyngeal exudate or posterior oropharyngeal erythema.  Neck:     Musculoskeletal: Neck supple. No muscular tenderness.     Thyroid: No thyromegaly.  Cardiovascular:     Rate and Rhythm: Normal rate and regular rhythm.  Pulmonary:     Effort: No respiratory distress.     Breath sounds: Normal breath sounds. No wheezing.  Abdominal:     General: Bowel sounds are normal.     Palpations: Abdomen is soft.     Tenderness: There is no abdominal tenderness.  Musculoskeletal:        General: No swelling or tenderness.  Lymphadenopathy:     Cervical: No cervical adenopathy.  Skin:    Findings: No  erythema or rash.  Neurological:     Mental Status: She is alert.  Psychiatric:        Mood and Affect: Mood normal.        Behavior: Behavior normal.     BP 118/70 (  BP Location: Left Arm, Patient Position: Sitting, Cuff Size: Normal)   Pulse 69   Temp 97.8 F (36.6 C) (Oral)   Resp 16   Wt 157 lb 3.2 oz (71.3 kg)   SpO2 98%   BMI 26.98 kg/m  Wt Readings from Last 3 Encounters:  09/25/18 157 lb 3.2 oz (71.3 kg)  07/18/18 161 lb 3.2 oz (73.1 kg)  05/08/18 161 lb 12.8 oz (73.4 kg)     Lab Results  Component Value Date   WBC 4.3 05/14/2018   HGB 12.3 05/14/2018   HCT 36.2 05/14/2018   PLT 307.0 05/14/2018   GLUCOSE 102 (H) 05/14/2018   CHOL 128 05/14/2018   TRIG 83.0 05/14/2018   HDL 48.70 05/14/2018   LDLCALC 63 05/14/2018   ALT 12 05/14/2018   AST 10 05/14/2018   NA 138 05/14/2018   K 4.4 05/14/2018   CL 104 05/14/2018   CREATININE 0.65 05/14/2018   BUN 11 05/14/2018   CO2 26 05/14/2018   TSH 1.50 02/06/2018   HGBA1C 6.1 05/14/2018   MICROALBUR <0.7 06/04/2017    Ct Chest Wo Contrast  Result Date: 07/31/2017 CLINICAL DATA:  Intermittent left chest pain for the past 6 weeks. History of right breast surgery for breast cancer. EXAM: CT CHEST WITHOUT CONTRAST TECHNIQUE: Multidetector CT imaging of the chest was performed following the standard protocol without IV contrast. COMPARISON:  Chest radiographs dated 07/25/2015 FINDINGS: Cardiovascular: Atheromatous coronary artery calcifications. Normal sized heart. Mediastinum/Nodes: No enlarged mediastinal or axillary lymph nodes. Thyroid gland, trachea, and esophagus demonstrate no significant findings. Lungs/Pleura: 9 mm rounded sub solid nodular opacity in the superior aspect of the superior segment of the left lower lobe. Minimal linear scarring bilaterally. No pleural fluid. Upper Abdomen: Cholecystectomy clips. Musculoskeletal: Thoracic and lower cervical spine degenerative changes. Right breast post lumpectomy changes.  IMPRESSION: 1. 9 mm ground-glass nodule in the superior segment of the left lower lobe. Initial follow-up with CT at 6-12 months is recommended to confirm persistence. If persistent, repeat CT is recommended every 2 years until 5 years of stability has been established. This recommendation follows the consensus statement: Guidelines for Management of Incidental Pulmonary Nodules Detected on CT Images: From the Fleischner Society 2017; Radiology 2017; 284:228-243. 2. Otherwise, unremarkable examination. Electronically Signed   By: Claudie Revering M.D.   On: 07/31/2017 10:10       Assessment & Plan:   Problem List Items Addressed This Visit    Anemia    Follow cbc.       Relevant Medications   cyanocobalamin ((VITAMIN B-12)) injection 1,000 mcg (Completed)   B12 deficiency - Primary    Continue b12 injections.        Relevant Medications   cyanocobalamin ((VITAMIN B-12)) injection 1,000 mcg (Completed)   Back pain    Chronic back pain.  Seeing neurosurgery.  Follow.        Breast cancer ([place])    Followed by oncology.       Diabetes mellitus ([place])    Low carb diet and exercise.  Follow met b and a1c.        GERD (gastroesophageal reflux disease)    Controlled on current regimen.        Hypercholesterolemia    On lipitor.  Low cholesterol diet and exercise.  Follow lipid panel and liver function tests.        Hypertension    Blood pressure under good control.  Continue same medication regimen.  Follow pressures.  Follow metabolic panel.        Lung nodule    Followed by oncology. Planning for f/u chest CT.        Stress    Increased stress as outlined.  Discussed with her today.  She feels she is doing better.  Does not feel needs any further intervention.  Follow.            Einar Pheasant, MD

## 2018-09-27 ENCOUNTER — Encounter: Payer: Self-pay | Admitting: Internal Medicine

## 2018-09-27 NOTE — Assessment & Plan Note (Signed)
Increased stress as outlined.  Discussed with her today.  She feels she is doing better.  Does not feel needs any further intervention.  Follow.

## 2018-09-27 NOTE — Assessment & Plan Note (Signed)
Chronic back pain.  Seeing neurosurgery.  Follow.

## 2018-09-27 NOTE — Assessment & Plan Note (Signed)
Continue b12 injections.  

## 2018-09-27 NOTE — Assessment & Plan Note (Signed)
Blood pressure under good control.  Continue same medication regimen.  Follow pressures.  Follow metabolic panel.   

## 2018-09-27 NOTE — Assessment & Plan Note (Signed)
On lipitor.  Low cholesterol diet and exercise.  Follow lipid panel and liver function tests.   

## 2018-09-27 NOTE — Assessment & Plan Note (Signed)
Followed by oncology 

## 2018-09-27 NOTE — Assessment & Plan Note (Signed)
Low carb diet and exercise.  Follow met b and a1c.   

## 2018-09-27 NOTE — Assessment & Plan Note (Signed)
Followed by oncology. Planning for f/u chest CT.

## 2018-09-27 NOTE — Assessment & Plan Note (Signed)
Controlled on current regimen.   

## 2018-09-27 NOTE — Assessment & Plan Note (Signed)
Follow cbc.  

## 2018-09-29 ENCOUNTER — Other Ambulatory Visit: Payer: Self-pay | Admitting: Internal Medicine

## 2018-10-09 ENCOUNTER — Other Ambulatory Visit (INDEPENDENT_AMBULATORY_CARE_PROVIDER_SITE_OTHER): Payer: Medicare Other

## 2018-10-09 DIAGNOSIS — E119 Type 2 diabetes mellitus without complications: Secondary | ICD-10-CM

## 2018-10-09 DIAGNOSIS — E7800 Pure hypercholesterolemia, unspecified: Secondary | ICD-10-CM | POA: Diagnosis not present

## 2018-10-09 LAB — BASIC METABOLIC PANEL
BUN: 12 mg/dL (ref 6–23)
CHLORIDE: 103 meq/L (ref 96–112)
CO2: 29 mEq/L (ref 19–32)
CREATININE: 0.71 mg/dL (ref 0.40–1.20)
Calcium: 10 mg/dL (ref 8.4–10.5)
GFR: 80.78 mL/min (ref >60.00)
Glucose, Bld: 101 mg/dL — ABNORMAL HIGH (ref 70–99)
Potassium: 4.5 mEq/L (ref 3.5–5.1)
Sodium: 139 mEq/L (ref 135–145)

## 2018-10-09 LAB — LIPID PANEL
CHOL/HDL RATIO: 3
CHOLESTEROL: 136 mg/dL (ref 0–200)
HDL: 53.1 mg/dL (ref >39.00)
LDL CALC: 64 mg/dL (ref 0–99)
NONHDL: 82.57
Triglycerides: 92 mg/dL (ref 0.0–149.0)
VLDL: 18.4 mg/dL (ref 0.0–40.0)

## 2018-10-09 LAB — HEPATIC FUNCTION PANEL
ALT: 15 U/L (ref 0–35)
AST: 15 U/L (ref 0–37)
Albumin: 4.5 g/dL (ref 3.5–5.2)
Alkaline Phosphatase: 54 U/L (ref 39–117)
BILIRUBIN TOTAL: 1.3 mg/dL — AB (ref 0.2–1.2)
Bilirubin, Direct: 0.2 mg/dL (ref 0.0–0.3)
Total Protein: 6.9 g/dL (ref 6.0–8.3)

## 2018-10-09 LAB — HEMOGLOBIN A1C: Hgb A1c MFr Bld: 6.1 % (ref 4.6–6.5)

## 2018-10-10 ENCOUNTER — Other Ambulatory Visit: Payer: Self-pay | Admitting: Internal Medicine

## 2018-10-10 NOTE — Progress Notes (Signed)
Order placed for f/u liver panel.  

## 2018-10-14 ENCOUNTER — Telehealth: Payer: Self-pay | Admitting: Internal Medicine

## 2018-10-14 NOTE — Telephone Encounter (Signed)
Copied from [place] [id number] [id number]. Topic: Quick Communication - Lab Results (Clinic Use ONLY) >> Oct 13, 2018 11:52 AM Lars Masson, LPN wrote:  Please call back with lab results Ph# [phone]  Called patient to inform them of lab results. When patient returns call, triage nurse may disclose results.

## 2018-10-20 DIAGNOSIS — R1032 Left lower quadrant pain: Secondary | ICD-10-CM | POA: Diagnosis not present

## 2018-10-20 DIAGNOSIS — C50911 Malignant neoplasm of unspecified site of right female breast: Secondary | ICD-10-CM | POA: Diagnosis not present

## 2018-10-20 DIAGNOSIS — Z170 Estrogen receptor positive status [ER+]: Secondary | ICD-10-CM | POA: Diagnosis not present

## 2018-10-20 DIAGNOSIS — G9589 Other specified diseases of spinal cord: Secondary | ICD-10-CM | POA: Diagnosis not present

## 2018-10-20 DIAGNOSIS — Z9011 Acquired absence of right breast and nipple: Secondary | ICD-10-CM | POA: Diagnosis not present

## 2018-10-20 DIAGNOSIS — R935 Abnormal findings on diagnostic imaging of other abdominal regions, including retroperitoneum: Secondary | ICD-10-CM | POA: Diagnosis not present

## 2018-10-20 DIAGNOSIS — R918 Other nonspecific abnormal finding of lung field: Secondary | ICD-10-CM | POA: Diagnosis not present

## 2018-10-20 DIAGNOSIS — R232 Flushing: Secondary | ICD-10-CM | POA: Diagnosis not present

## 2018-10-20 DIAGNOSIS — M810 Age-related osteoporosis without current pathological fracture: Secondary | ICD-10-CM | POA: Diagnosis not present

## 2018-10-20 DIAGNOSIS — M898X8 Other specified disorders of bone, other site: Secondary | ICD-10-CM | POA: Diagnosis not present

## 2018-10-20 DIAGNOSIS — R911 Solitary pulmonary nodule: Secondary | ICD-10-CM | POA: Diagnosis not present

## 2018-11-11 ENCOUNTER — Other Ambulatory Visit (INDEPENDENT_AMBULATORY_CARE_PROVIDER_SITE_OTHER): Payer: Medicare Other

## 2018-11-11 ENCOUNTER — Ambulatory Visit (INDEPENDENT_AMBULATORY_CARE_PROVIDER_SITE_OTHER): Payer: Medicare Other

## 2018-11-11 DIAGNOSIS — E538 Deficiency of other specified B group vitamins: Secondary | ICD-10-CM

## 2018-11-11 LAB — HEPATIC FUNCTION PANEL
ALT: 13 U/L (ref 0–35)
AST: 14 U/L (ref 0–37)
Albumin: 4.4 g/dL (ref 3.5–5.2)
Alkaline Phosphatase: 48 U/L (ref 39–117)
Bilirubin, Direct: 0.2 mg/dL (ref 0.0–0.3)
TOTAL PROTEIN: 6.8 g/dL (ref 6.0–8.3)
Total Bilirubin: 1.6 mg/dL — ABNORMAL HIGH (ref 0.2–1.2)

## 2018-11-11 MED ORDER — CYANOCOBALAMIN 1000 MCG/ML IJ SOLN
1000.0000 ug | Freq: Once | INTRAMUSCULAR | Status: AC
  Administered 2018-11-11: 1000 ug via INTRAMUSCULAR

## 2018-11-11 NOTE — Progress Notes (Signed)
Pt was seen today for B-12 shot given IM in the LD. Pt tolerated well.

## 2018-11-12 DIAGNOSIS — M5136 Other intervertebral disc degeneration, lumbar region: Secondary | ICD-10-CM | POA: Diagnosis not present

## 2018-11-12 DIAGNOSIS — M5416 Radiculopathy, lumbar region: Secondary | ICD-10-CM | POA: Diagnosis not present

## 2018-11-12 DIAGNOSIS — G894 Chronic pain syndrome: Secondary | ICD-10-CM | POA: Diagnosis not present

## 2018-11-30 ENCOUNTER — Other Ambulatory Visit: Payer: Self-pay | Admitting: Internal Medicine

## 2018-11-30 DIAGNOSIS — K219 Gastro-esophageal reflux disease without esophagitis: Secondary | ICD-10-CM

## 2018-11-30 DIAGNOSIS — I10 Essential (primary) hypertension: Secondary | ICD-10-CM

## 2018-12-11 ENCOUNTER — Ambulatory Visit: Payer: Medicare Other

## 2018-12-12 ENCOUNTER — Other Ambulatory Visit: Payer: Self-pay | Admitting: Internal Medicine

## 2018-12-18 DIAGNOSIS — G453 Amaurosis fugax: Secondary | ICD-10-CM | POA: Diagnosis not present

## 2018-12-18 DIAGNOSIS — H35052 Retinal neovascularization, unspecified, left eye: Secondary | ICD-10-CM | POA: Diagnosis not present

## 2018-12-18 DIAGNOSIS — H35372 Puckering of macula, left eye: Secondary | ICD-10-CM | POA: Diagnosis not present

## 2018-12-18 DIAGNOSIS — H3322 Serous retinal detachment, left eye: Secondary | ICD-10-CM | POA: Diagnosis not present

## 2018-12-18 DIAGNOSIS — E119 Type 2 diabetes mellitus without complications: Secondary | ICD-10-CM | POA: Diagnosis not present

## 2019-01-21 DIAGNOSIS — M5416 Radiculopathy, lumbar region: Secondary | ICD-10-CM | POA: Diagnosis not present

## 2019-01-27 ENCOUNTER — Ambulatory Visit (INDEPENDENT_AMBULATORY_CARE_PROVIDER_SITE_OTHER): Payer: Medicare Other | Admitting: Internal Medicine

## 2019-01-27 ENCOUNTER — Encounter: Payer: Self-pay | Admitting: Internal Medicine

## 2019-01-27 ENCOUNTER — Other Ambulatory Visit: Payer: Self-pay

## 2019-01-27 DIAGNOSIS — E538 Deficiency of other specified B group vitamins: Secondary | ICD-10-CM

## 2019-01-27 DIAGNOSIS — C50919 Malignant neoplasm of unspecified site of unspecified female breast: Secondary | ICD-10-CM | POA: Diagnosis not present

## 2019-01-27 DIAGNOSIS — R0602 Shortness of breath: Secondary | ICD-10-CM

## 2019-01-27 DIAGNOSIS — D649 Anemia, unspecified: Secondary | ICD-10-CM | POA: Diagnosis not present

## 2019-01-27 DIAGNOSIS — E119 Type 2 diabetes mellitus without complications: Secondary | ICD-10-CM

## 2019-01-27 DIAGNOSIS — M546 Pain in thoracic spine: Secondary | ICD-10-CM | POA: Diagnosis not present

## 2019-01-27 DIAGNOSIS — I10 Essential (primary) hypertension: Secondary | ICD-10-CM

## 2019-01-27 DIAGNOSIS — F439 Reaction to severe stress, unspecified: Secondary | ICD-10-CM

## 2019-01-27 DIAGNOSIS — E7800 Pure hypercholesterolemia, unspecified: Secondary | ICD-10-CM

## 2019-01-27 DIAGNOSIS — K219 Gastro-esophageal reflux disease without esophagitis: Secondary | ICD-10-CM

## 2019-01-27 DIAGNOSIS — R911 Solitary pulmonary nodule: Secondary | ICD-10-CM

## 2019-01-27 NOTE — Progress Notes (Signed)
Patient ID: Sherry Simon, female   DOB: 02-19-1946, 73 y.o.   MRN: [id number]   Virtual Visit via Video Note  This visit type was conducted due to national recommendations for restrictions regarding the COVID-19 pandemic (e.g. social distancing).  This format is felt to be most appropriate for this patient at this time.  All issues noted in this document were discussed and addressed.  No physical exam was performed (except for noted visual exam findings with Video Visits).   I connected with Sherry Simon by a video enabled telemedicine application or telephone and verified that I am speaking with the correct person using two identifiers. Location patient: home Location provider: work  Persons participating in the virtual visit: patient, provider  I discussed the limitations, risks, security and privacy concerns of performing an evaluation and management service by video and the availability of in person appointments.  The patient expressed understanding and agreed to proceed.   Reason for visit: scheduled follow up.   HPI: She is followed by neurosurgery for f/u of her back and leg pain.  Is s/p injection and takes pain medication.  Recently has had some increased pain.  Relief when lies down.  Has f/u planned in 02/2019.  Discussed contacting them earlier if increased pain.  States blood pressure is doing well.  No chest pain.  Does report some sob with exertion.  Have discussed cardiology.  She is in agreement.  Had f/u chest CT 09/2018.  Stable.  Recommended f/u chest CT 6 months after last.  Trying to stay in due to COVID restrictions.  No increased cough or chest congestion.  No fever.  No acid reflux.  No abdominal pain.  Previous LLQ pain resolved.  Sugars doing well.  a1c 11/13/18 - 6.1.  Has a sty left eye.  Persistent.  Has tried warm compresses.  No pain in the eye.  Plans to f/u with ophthalmology.     ROS: See pertinent positives and negatives per HPI.  Past Medical History:   Diagnosis Date  . Anxiety and depression   . Colon polyps   . Depression   . Diabetes mellitus ([place])   . Endometriosis    requiring hysterectomy  . History of chicken pox   . Hypercholesterolemia   . Hypertension   . Nephrolithiasis   . Pericarditis    recurrent, unkown origin  . Tachycardia     Past Surgical History:  Procedure Laterality Date  . ABDOMINAL HYSTERECTOMY  1980  . APPENDECTOMY  1055  . BACK SURGERY  1005-2010   laminectomy  . CHOLECYSTECTOMY     open  . OOPHORECTOMY  1982  . TONSILLECTOMY      Family History  Problem Relation Age of Onset  . Heart disease Father        myocardial infarction - died 70  . Thyroid disease Mother   . Transient ischemic attack Mother        multiple  . Breast cancer Mother   . Hyperlipidemia Mother   . Kidney disease Mother   . Diabetes Mother   . Rheumatic fever Sister        mitral valve problems  . Breast cancer Paternal Aunt   . Other Sister        Small vessel disease  . Colon cancer Neg Hx     SOCIAL HX: reviewed.    Current Outpatient Medications:  .  acyclovir (ZOVIRAX) 400 MG tablet, TAKE 1 TABLET BY MOUTH  DAILY, Disp: 90  tablet, Rfl: 1 .  aspirin 81 MG tablet, Take 81 mg by mouth daily., Disp: , Rfl:  .  atorvastatin (LIPITOR) 40 MG tablet, TAKE 1 TABLET BY MOUTH  DAILY, Disp: 90 tablet, Rfl: 1 .  Calcium Carbonate-Vitamin D 600-400 MG-UNIT tablet, Take by mouth., Disp: , Rfl:  .  citalopram (CELEXA) 40 MG tablet, TAKE 1 TABLET BY MOUTH  DAILY, Disp: 90 tablet, Rfl: 1 .  ferrous sulfate 325 (65 FE) MG EC tablet, Take 325 mg by mouth daily with breakfast. , Disp: , Rfl:  .  gabapentin (NEURONTIN) 600 MG tablet, Take 600 mg by mouth 2 (two) times daily. , Disp: , Rfl:  .  glucose blood (ONE TOUCH ULTRA TEST) test strip, TEST BLOOD SUGAR TWICE A DAY, Disp: 200 each, Rfl: PRN .  HYDROcodone-acetaminophen (NORCO) 10-325 MG tablet, TAKE 1 TABLET BY MOUTH THREE TIMES DAILY AS NEEDED FOR CHRONIC PAIN, Disp: ,  Rfl: 0 .  latanoprost (XALATAN) 0.005 % ophthalmic solution, INSTILL 1 DROP INTO THE  LEFT EYE AT BEDTIME  (REFRIGERATE UNTIL FIRST  OPENED FOR USE) (Patient taking differently: Place 1 drop into both eyes. ), Disp: 7.5 mL, Rfl: 3 .  metFORMIN (GLUCOPHAGE) 500 MG tablet, Take 1 tablet by mouth 2 (two) times daily., Disp: , Rfl:  .  pantoprazole (PROTONIX) 40 MG tablet, TAKE 1 TABLET BY MOUTH TWO  TIMES DAILY, Disp: 180 tablet, Rfl: 1  EXAM:  GENERAL: alert, oriented, appears well and in no acute distress  HEENT: atraumatic, conjunttiva clear, no obvious abnormalities on inspection of external nose and ears  NECK: normal movements of the head and neck  LUNGS: on inspection no signs of respiratory distress, breathing rate appears normal, no obvious gross SOB, gasping or wheezing  CV: no obvious cyanosis  PSYCH/NEURO: pleasant and cooperative, no obvious depression or anxiety, speech and thought processing grossly intact  ASSESSMENT AND PLAN:  Discussed the following assessment and plan:  Anemia, unspecified type - Plan: CBC with Differential/Platelet  B12 deficiency - Plan: Vitamin B12  Acute midline thoracic back pain  Malignant neoplasm of female breast, unspecified estrogen receptor status, unspecified laterality, unspecified site of breast ([place])  Type 2 diabetes mellitus without complication, without long-term current use of insulin (HCC) - Plan: Hemoglobin Z6S, Basic metabolic panel, Microalbumin / creatinine urine ratio  Gastroesophageal reflux disease, esophagitis presence not specified  Hypercholesterolemia - Plan: Hepatic function panel, Lipid panel  Essential hypertension - Plan: TSH  Lung nodule  SOB (shortness of breath) - Plan: Ambulatory referral to Cardiology  Stress  Anemia Follow cbc.   B12 deficiency Was receiving B12 injections.  Discussed oral B12.    Back pain Chronic back pain.  Has been seeing neurosurgery.  Has f/u planned 02/2019.  Discussed  contacting them earlier given increased pain.    Breast cancer Followed by oncology.   Diabetes mellitus Low carb diet and exercise.  Follow met b and a1c.  Recent a1c 6.1.    GERD (gastroesophageal reflux disease) Controlled on protonix.    Hypercholesterolemia On lipitor.  Low cholesterol diet and exercise.  Follow lipid panel and liver function tests.    Hypertension Blood pressure has been under good control.  Continue current medication regimen.  Follow pressures.  Follow metabolic panel.    Lung nodule Being followed by oncology.  Had chest CT 09/2018.  Stable.  Recommended f/u chest CT 6 months after last.    SOB (shortness of breath) Describes sob with exertion.  Discussed further w/up.  Chest CT as outlined.  Discussed cardiology referral.  Pt in agreement.    Stress Increased stress.  Discussed with her today.  She feels she is handling things relatively well.  Does not feel needs anything more at this time.  Follow.      I discussed the assessment and treatment plan with the patient. The patient was provided an opportunity to ask questions and all were answered. The patient agreed with the plan and demonstrated an understanding of the instructions.   The patient was advised to call back or seek an in-person evaluation if the symptoms worsen or if the condition fails to improve as anticipated.    Einar Pheasant, MD

## 2019-01-31 ENCOUNTER — Encounter: Payer: Self-pay | Admitting: Internal Medicine

## 2019-01-31 NOTE — Assessment & Plan Note (Signed)
Followed by oncology 

## 2019-01-31 NOTE — Assessment & Plan Note (Signed)
Low carb diet and exercise.  Follow met b and a1c.  Recent a1c 6.1.

## 2019-01-31 NOTE — Assessment & Plan Note (Signed)
On lipitor.  Low cholesterol diet and exercise.  Follow lipid panel and liver function tests.   

## 2019-01-31 NOTE — Assessment & Plan Note (Signed)
Describes sob with exertion.  Discussed further w/up.  Chest CT as outlined.  Discussed cardiology referral.  Pt in agreement.

## 2019-01-31 NOTE — Assessment & Plan Note (Signed)
Follow cbc.  

## 2019-01-31 NOTE — Assessment & Plan Note (Signed)
Being followed by oncology.  Had chest CT 09/2018.  Stable.  Recommended f/u chest CT 6 months after last.

## 2019-01-31 NOTE — Assessment & Plan Note (Signed)
Increased stress.  Discussed with her today.  She feels she is handling things relatively well.  Does not feel needs anything more at this time.  Follow.

## 2019-01-31 NOTE — Assessment & Plan Note (Signed)
Chronic back pain.  Has been seeing neurosurgery.  Has f/u planned 02/2019.  Discussed contacting them earlier given increased pain.

## 2019-01-31 NOTE — Assessment & Plan Note (Signed)
Controlled on protonix.   

## 2019-01-31 NOTE — Assessment & Plan Note (Signed)
Was receiving B12 injections.  Discussed oral B12.

## 2019-01-31 NOTE — Assessment & Plan Note (Signed)
Blood pressure has been under good control.  Continue current medication regimen.  Follow pressures.  Follow metabolic panel.  

## 2019-02-01 IMAGING — CR DG CHEST 2V
2 series · 2 of 2 positions shown · non-contrast
Comparison: 04/03/2016

CLINICAL DATA: Shortness of breath with swelling in the legs.

EXAM:
CHEST  2 VIEW

[chest pa]
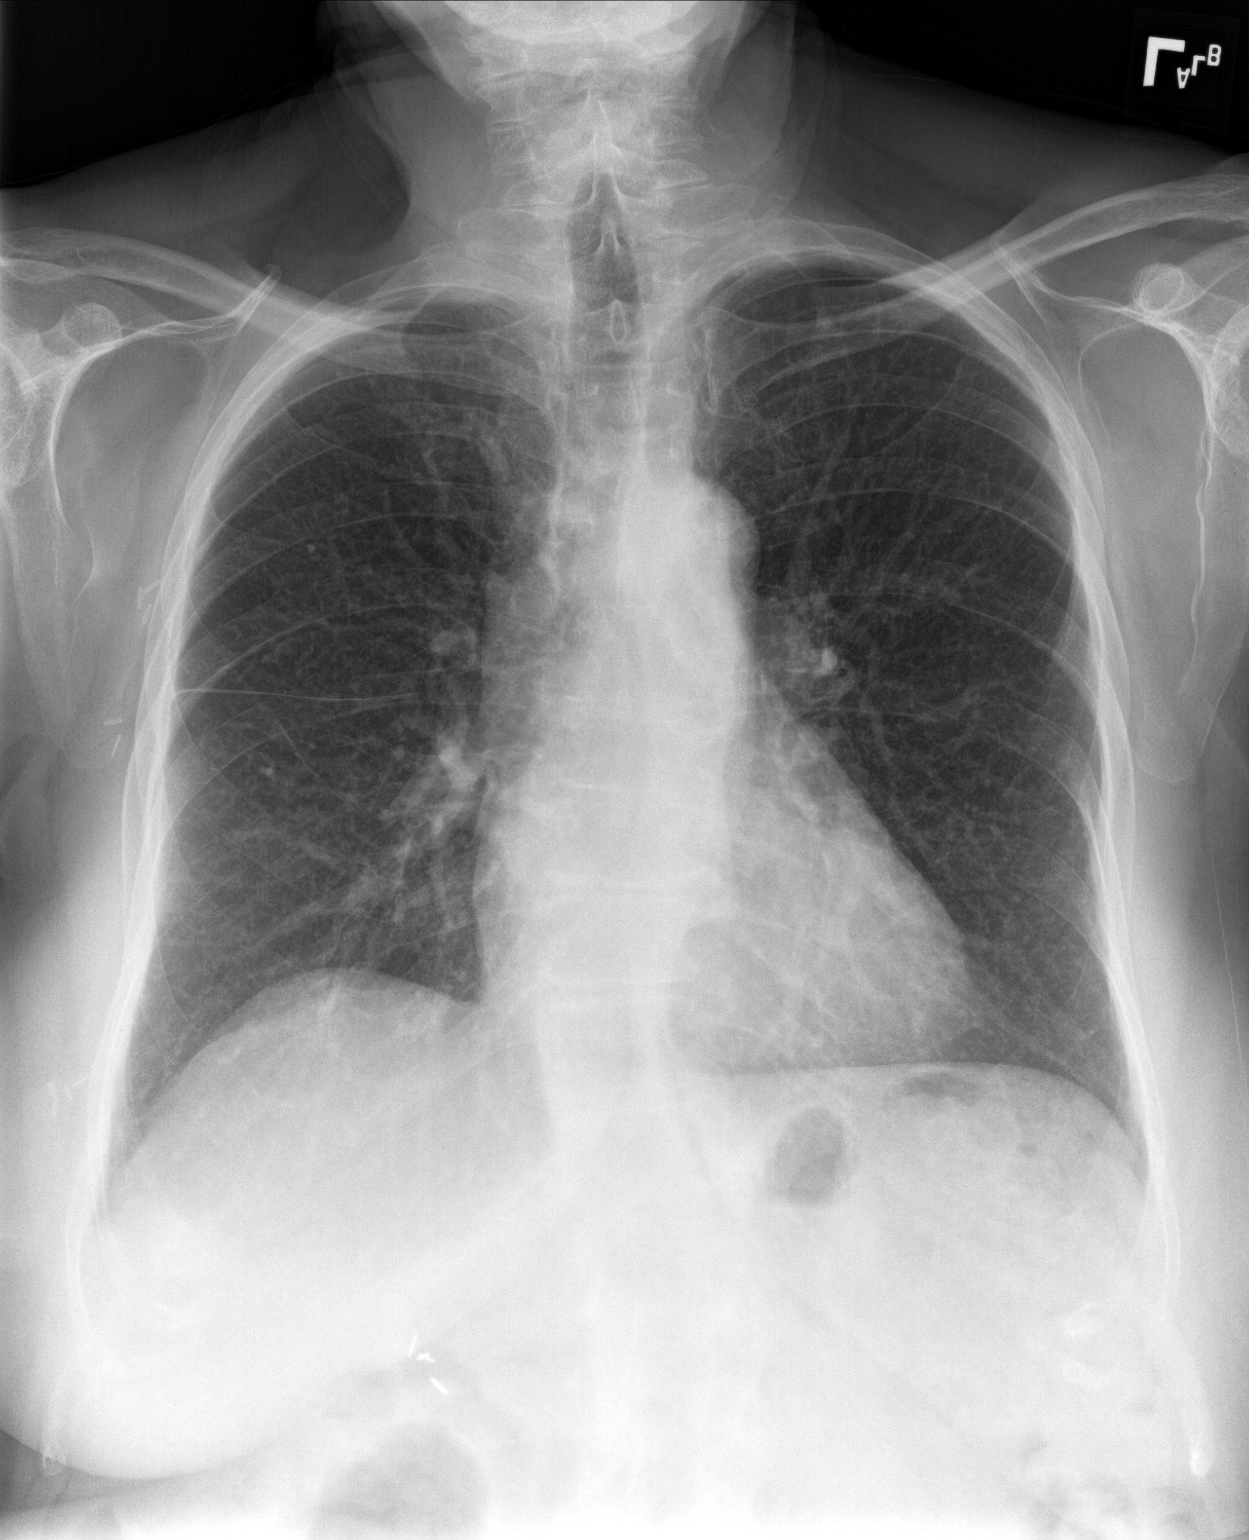

[chest lat]
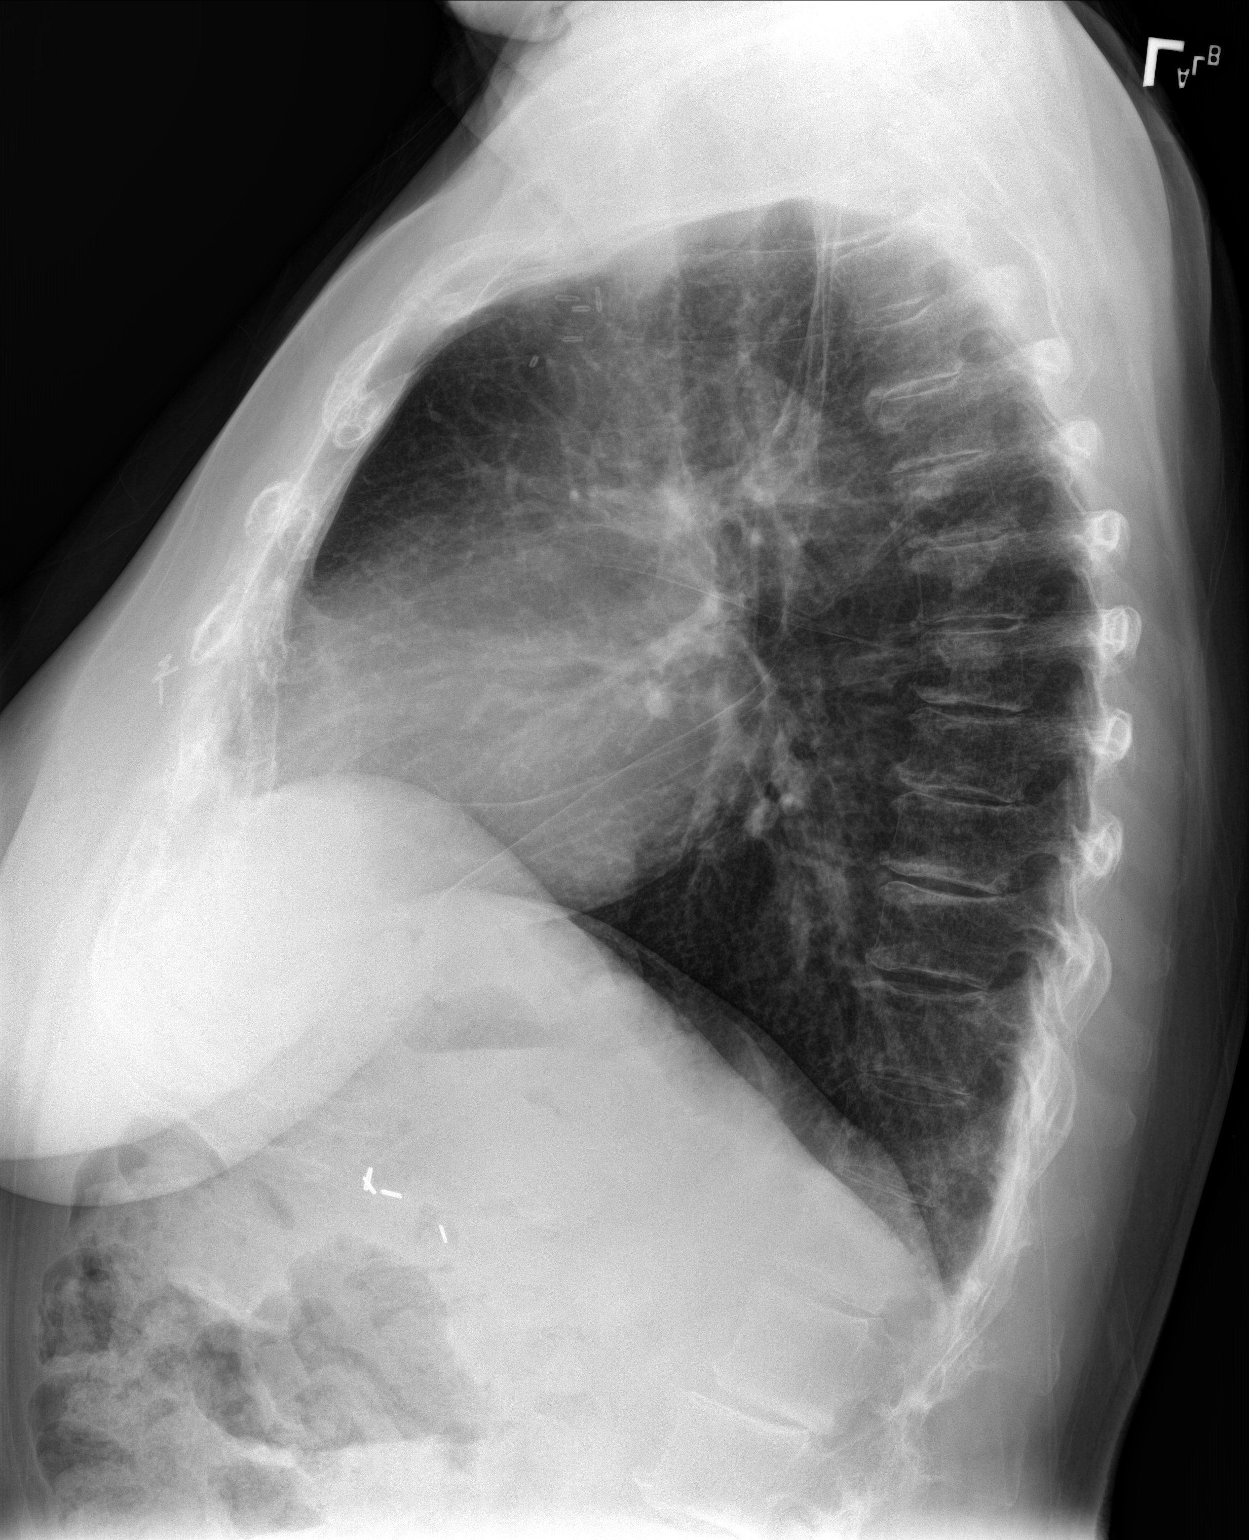

[2 of 2 positions shown; findings below may reference images not displayed]

FINDINGS: Heart size is normal. Mediastinal shadows are normal. The lungs are
clear. The vascularity is normal. No effusions. Surgical clips in
the right breast. Ordinary degenerative changes affect the spine.
IMPRESSION: No active cardiopulmonary disease.

## 2019-02-08 IMAGING — CT CT CHEST W/O CM
1 series · 15 of 34 positions shown, 19 images · non-contrast
Comparison: Chest radiographs dated 07/25/2015

CLINICAL DATA: Intermittent left chest pain for the past 6 weeks.
History of right breast surgery for breast cancer.

EXAM:
CT CHEST WITHOUT CONTRAST
TECHNIQUE: Multidetector CT imaging of the chest was performed following the
standard protocol without IV contrast.

[Series 2: thorax · axial · 0.72mm/px · z∈[-678,-416]mm · 15 of 155 slices shown, 19 images]
[im 12/155  mediastinal]
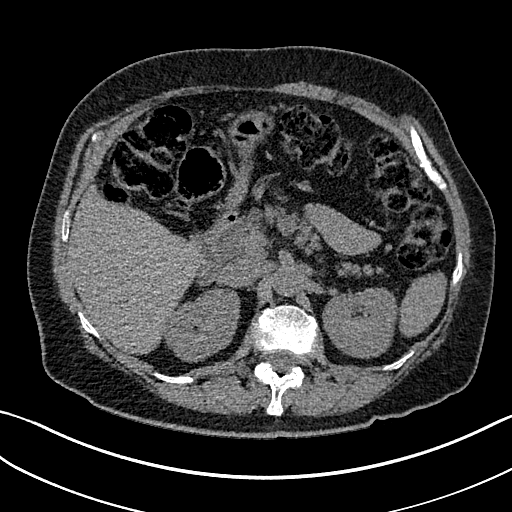
[im 12/155  lung]
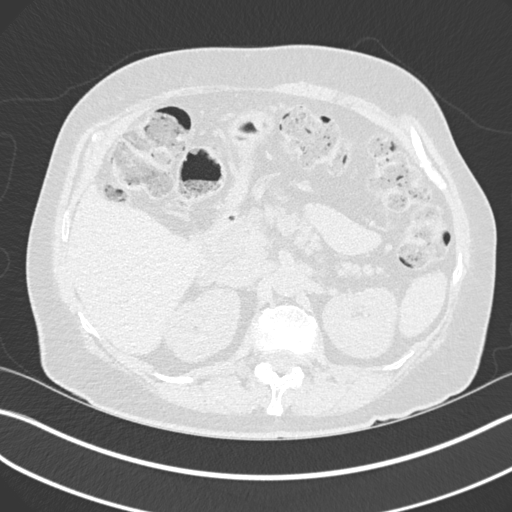
[im 23/155  lung]
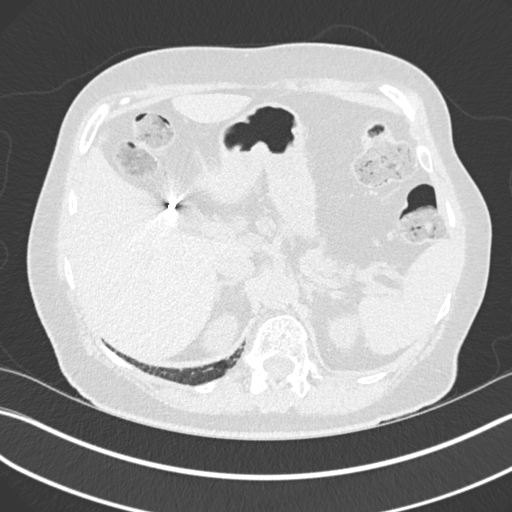
[im 31/155  lung]
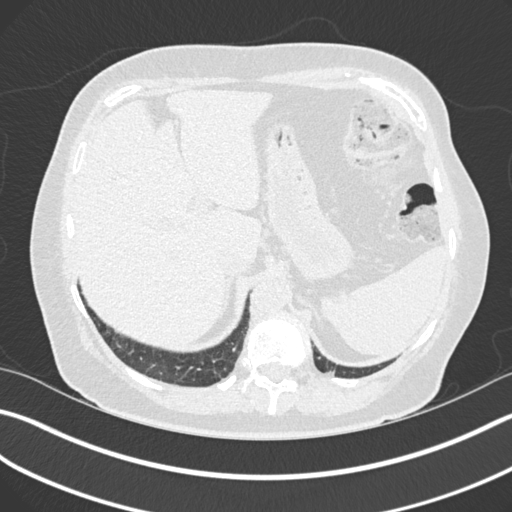
[im 40/155  lung]
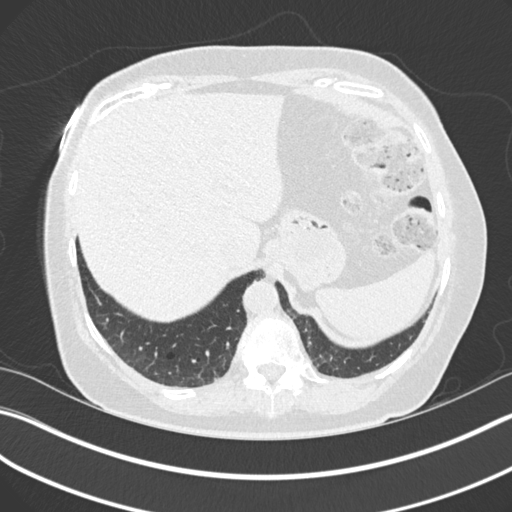
[im 52/155  mediastinal]
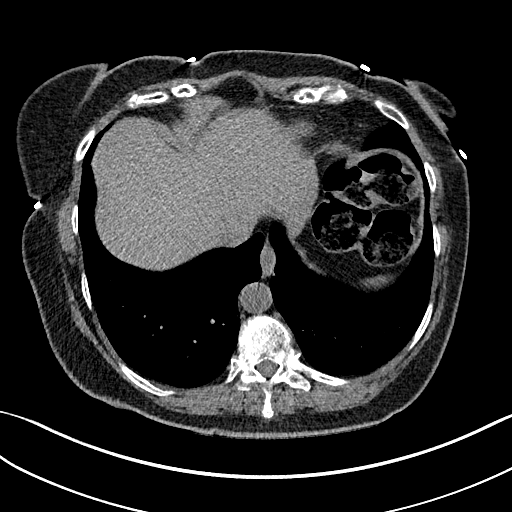
[im 52/155  lung]
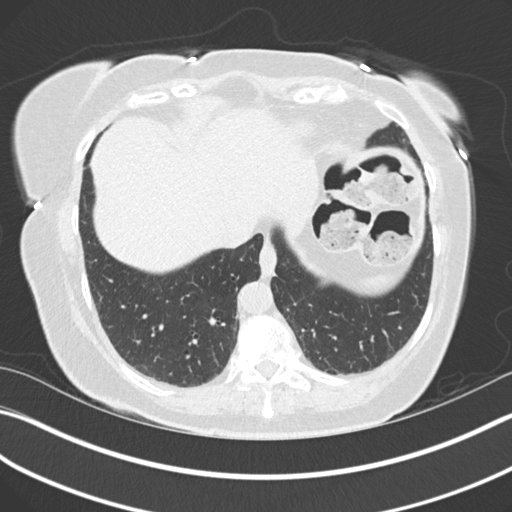
[im 62/155  lung]
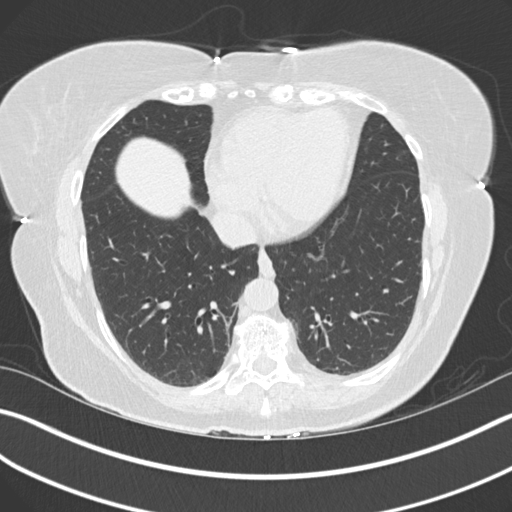
[im 69/155  lung]
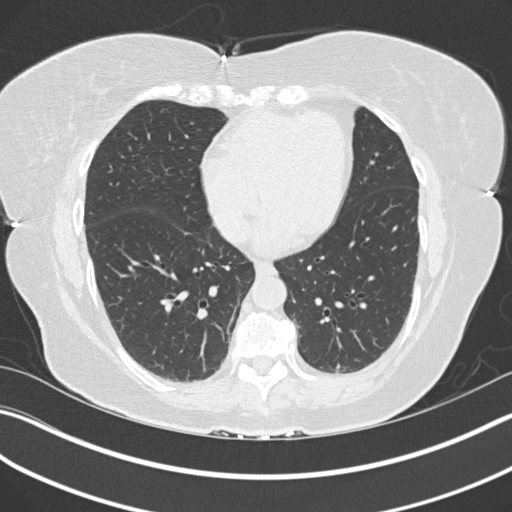
[im 80/155  lung]
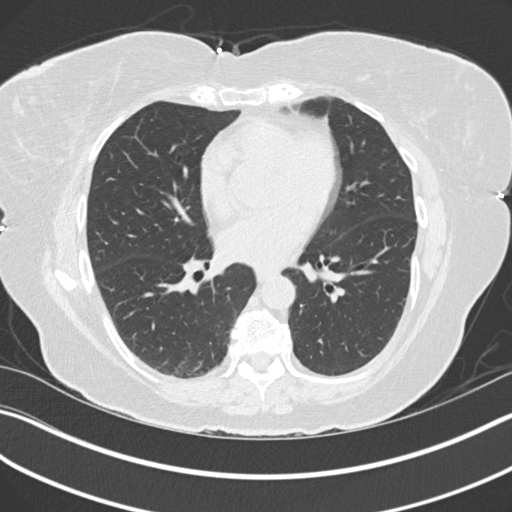
[im 86/155  mediastinal]
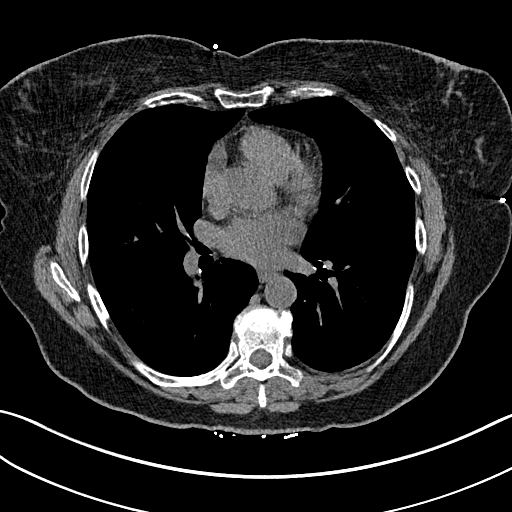
[im 86/155  lung]
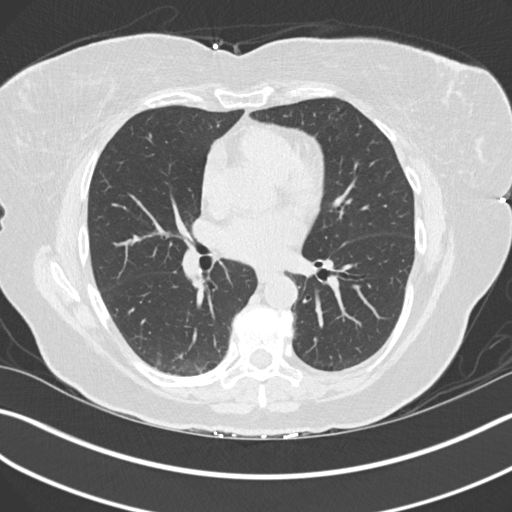
[im 93/155  lung]
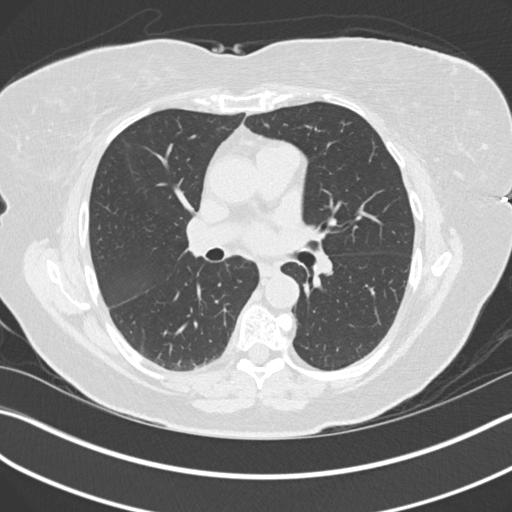
[im 103/155  lung]
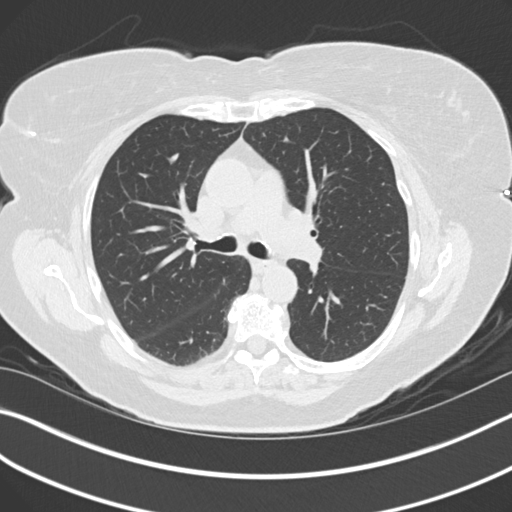
[im 115/155  lung]
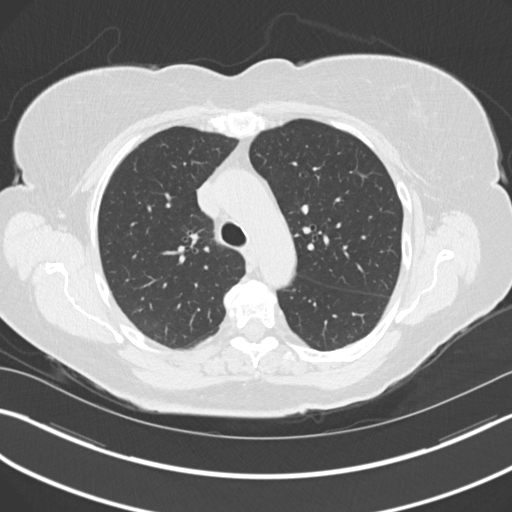
[im 124/155  mediastinal]
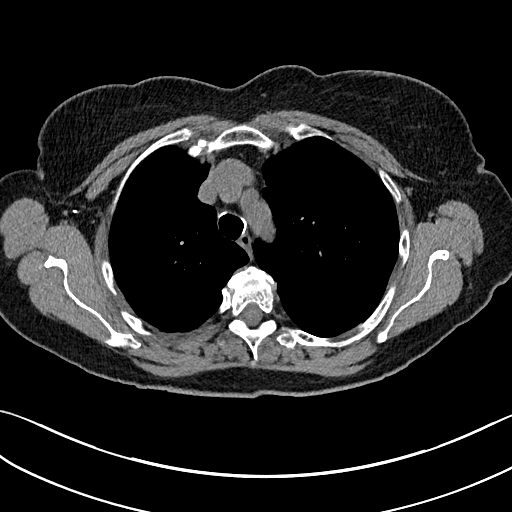
[im 124/155  lung]
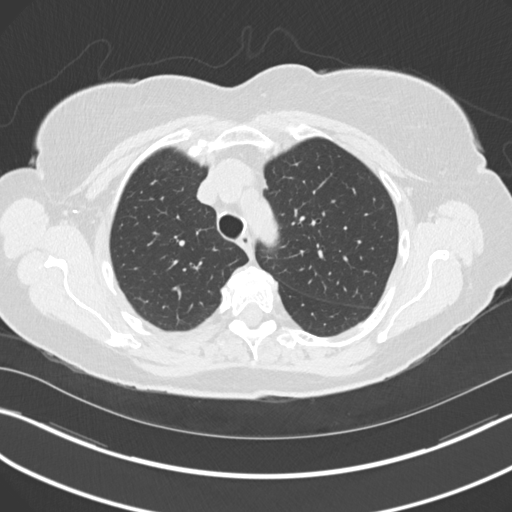
[im 132/155  lung]
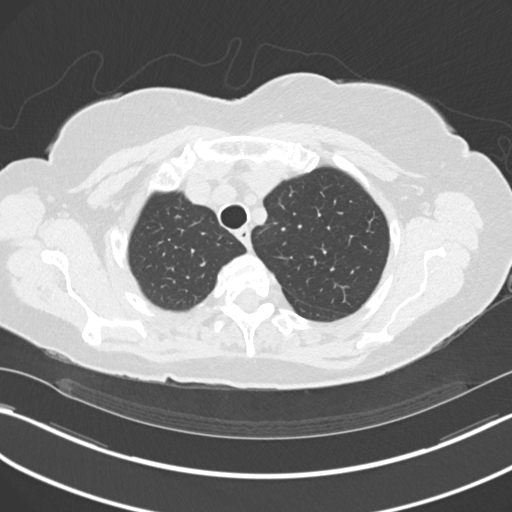
[im 143/155  lung]
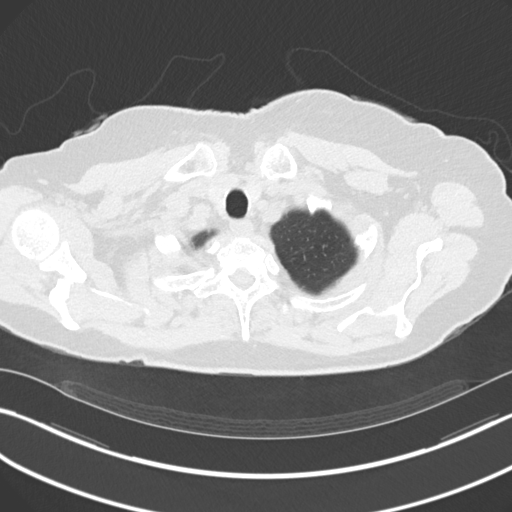

[15 of 34 positions shown; findings below may reference images not displayed]

FINDINGS: Cardiovascular: Atheromatous coronary artery calcifications. Normal
sized heart.

Mediastinum/Nodes: No enlarged mediastinal or axillary lymph nodes.
Thyroid gland, trachea, and esophagus demonstrate no significant
findings.

Lungs/Pleura: 9 mm rounded sub solid nodular opacity in the superior
aspect of the superior segment of the left lower lobe. Minimal
linear scarring bilaterally. No pleural fluid.

Upper Abdomen: Cholecystectomy clips.

Musculoskeletal: Thoracic and lower cervical spine degenerative
changes. Right breast post lumpectomy changes.
IMPRESSION: 1. 9 mm ground-glass nodule in the superior segment of the left
lower lobe. Initial follow-up with CT at 6-12 months is recommended
to confirm persistence. If persistent, repeat CT is recommended
every 2 years until 5 years of stability has been established. This
recommendation follows the consensus statement: Guidelines for
Management of Incidental Pulmonary Nodules Detected on CT Images:
2. Otherwise, unremarkable examination.

## 2019-02-10 ENCOUNTER — Other Ambulatory Visit: Payer: Self-pay | Admitting: Internal Medicine

## 2019-02-10 ENCOUNTER — Other Ambulatory Visit: Payer: Self-pay

## 2019-02-10 ENCOUNTER — Telehealth: Payer: Self-pay

## 2019-02-10 MED ORDER — GLUCOSE BLOOD VI STRP: ORAL_STRIP | 99 refills | Status: AC

## 2019-02-10 NOTE — Telephone Encounter (Signed)
Refill sent in

## 2019-02-10 NOTE — Telephone Encounter (Signed)
Copied from [place] [id number]. Topic: General - Other >> Feb 10, 2019  2:06 PM Rainey Pines A wrote: Nam from [employer] called and stated that they have faxed over on 2 separate occasions the order for the glucose blood (ONE TOUCH ULTRA TEST) test strip . Nam would like a callback at [phone] order number [id number]

## 2019-02-24 DIAGNOSIS — R0602 Shortness of breath: Secondary | ICD-10-CM | POA: Diagnosis not present

## 2019-02-24 DIAGNOSIS — I5021 Acute systolic (congestive) heart failure: Secondary | ICD-10-CM | POA: Diagnosis not present

## 2019-02-24 DIAGNOSIS — I208 Other forms of angina pectoris: Secondary | ICD-10-CM | POA: Diagnosis not present

## 2019-02-24 DIAGNOSIS — E782 Mixed hyperlipidemia: Secondary | ICD-10-CM | POA: Diagnosis not present

## 2019-02-25 DIAGNOSIS — H401122 Primary open-angle glaucoma, left eye, moderate stage: Secondary | ICD-10-CM | POA: Diagnosis not present

## 2019-02-25 DIAGNOSIS — E119 Type 2 diabetes mellitus without complications: Secondary | ICD-10-CM | POA: Diagnosis not present

## 2019-02-25 DIAGNOSIS — H35372 Puckering of macula, left eye: Secondary | ICD-10-CM | POA: Diagnosis not present

## 2019-02-26 DIAGNOSIS — C50911 Malignant neoplasm of unspecified site of right female breast: Secondary | ICD-10-CM | POA: Diagnosis not present

## 2019-02-26 DIAGNOSIS — R918 Other nonspecific abnormal finding of lung field: Secondary | ICD-10-CM | POA: Diagnosis not present

## 2019-02-26 DIAGNOSIS — Z1231 Encounter for screening mammogram for malignant neoplasm of breast: Secondary | ICD-10-CM | POA: Diagnosis not present

## 2019-02-26 DIAGNOSIS — Z170 Estrogen receptor positive status [ER+]: Secondary | ICD-10-CM | POA: Diagnosis not present

## 2019-03-05 DIAGNOSIS — M549 Dorsalgia, unspecified: Secondary | ICD-10-CM | POA: Diagnosis not present

## 2019-03-05 DIAGNOSIS — C50911 Malignant neoplasm of unspecified site of right female breast: Secondary | ICD-10-CM | POA: Diagnosis not present

## 2019-03-05 DIAGNOSIS — M810 Age-related osteoporosis without current pathological fracture: Secondary | ICD-10-CM | POA: Diagnosis not present

## 2019-03-05 DIAGNOSIS — G8929 Other chronic pain: Secondary | ICD-10-CM | POA: Diagnosis not present

## 2019-03-05 DIAGNOSIS — R918 Other nonspecific abnormal finding of lung field: Secondary | ICD-10-CM | POA: Diagnosis not present

## 2019-03-05 DIAGNOSIS — Z170 Estrogen receptor positive status [ER+]: Secondary | ICD-10-CM | POA: Diagnosis not present

## 2019-03-05 DIAGNOSIS — R0602 Shortness of breath: Secondary | ICD-10-CM | POA: Diagnosis not present

## 2019-03-05 DIAGNOSIS — R911 Solitary pulmonary nodule: Secondary | ICD-10-CM | POA: Diagnosis not present

## 2019-03-05 DIAGNOSIS — Z1239 Encounter for other screening for malignant neoplasm of breast: Secondary | ICD-10-CM | POA: Diagnosis not present

## 2019-03-11 DIAGNOSIS — M5416 Radiculopathy, lumbar region: Secondary | ICD-10-CM | POA: Diagnosis not present

## 2019-03-11 DIAGNOSIS — G894 Chronic pain syndrome: Secondary | ICD-10-CM | POA: Diagnosis not present

## 2019-03-11 DIAGNOSIS — M5136 Other intervertebral disc degeneration, lumbar region: Secondary | ICD-10-CM | POA: Diagnosis not present

## 2019-03-13 DIAGNOSIS — R0602 Shortness of breath: Secondary | ICD-10-CM | POA: Diagnosis not present

## 2019-04-07 DIAGNOSIS — I25118 Atherosclerotic heart disease of native coronary artery with other forms of angina pectoris: Secondary | ICD-10-CM | POA: Diagnosis not present

## 2019-04-07 DIAGNOSIS — R0602 Shortness of breath: Secondary | ICD-10-CM | POA: Diagnosis not present

## 2019-04-07 DIAGNOSIS — E782 Mixed hyperlipidemia: Secondary | ICD-10-CM | POA: Diagnosis not present

## 2019-04-07 DIAGNOSIS — I10 Essential (primary) hypertension: Secondary | ICD-10-CM | POA: Diagnosis not present

## 2019-04-08 DIAGNOSIS — M5416 Radiculopathy, lumbar region: Secondary | ICD-10-CM | POA: Diagnosis not present

## 2019-04-14 ENCOUNTER — Other Ambulatory Visit: Payer: Self-pay | Admitting: Internal Medicine

## 2019-04-14 DIAGNOSIS — K219 Gastro-esophageal reflux disease without esophagitis: Secondary | ICD-10-CM

## 2019-04-14 DIAGNOSIS — I10 Essential (primary) hypertension: Secondary | ICD-10-CM

## 2019-05-06 ENCOUNTER — Other Ambulatory Visit: Payer: Self-pay | Admitting: Internal Medicine

## 2019-05-13 DIAGNOSIS — M5136 Other intervertebral disc degeneration, lumbar region: Secondary | ICD-10-CM | POA: Diagnosis not present

## 2019-05-13 DIAGNOSIS — M5416 Radiculopathy, lumbar region: Secondary | ICD-10-CM | POA: Diagnosis not present

## 2019-06-10 DIAGNOSIS — M5136 Other intervertebral disc degeneration, lumbar region: Secondary | ICD-10-CM | POA: Diagnosis not present

## 2019-06-10 DIAGNOSIS — M5416 Radiculopathy, lumbar region: Secondary | ICD-10-CM | POA: Diagnosis not present

## 2019-06-10 DIAGNOSIS — M5030 Other cervical disc degeneration, unspecified cervical region: Secondary | ICD-10-CM | POA: Diagnosis not present

## 2019-06-11 DIAGNOSIS — H353221 Exudative age-related macular degeneration, left eye, with active choroidal neovascularization: Secondary | ICD-10-CM | POA: Diagnosis not present

## 2019-06-11 DIAGNOSIS — H3322 Serous retinal detachment, left eye: Secondary | ICD-10-CM | POA: Diagnosis not present

## 2019-06-11 DIAGNOSIS — H4421 Degenerative myopia, right eye: Secondary | ICD-10-CM | POA: Diagnosis not present

## 2019-06-11 DIAGNOSIS — H35052 Retinal neovascularization, unspecified, left eye: Secondary | ICD-10-CM | POA: Diagnosis not present

## 2019-06-11 DIAGNOSIS — H4422 Degenerative myopia, left eye: Secondary | ICD-10-CM | POA: Diagnosis not present

## 2019-06-12 ENCOUNTER — Other Ambulatory Visit: Payer: Self-pay

## 2019-06-12 ENCOUNTER — Other Ambulatory Visit (INDEPENDENT_AMBULATORY_CARE_PROVIDER_SITE_OTHER): Payer: Medicare Other

## 2019-06-12 DIAGNOSIS — E7800 Pure hypercholesterolemia, unspecified: Secondary | ICD-10-CM | POA: Diagnosis not present

## 2019-06-12 DIAGNOSIS — I10 Essential (primary) hypertension: Secondary | ICD-10-CM

## 2019-06-12 DIAGNOSIS — E119 Type 2 diabetes mellitus without complications: Secondary | ICD-10-CM

## 2019-06-12 DIAGNOSIS — D649 Anemia, unspecified: Secondary | ICD-10-CM

## 2019-06-12 DIAGNOSIS — E538 Deficiency of other specified B group vitamins: Secondary | ICD-10-CM

## 2019-06-12 LAB — CBC WITH DIFFERENTIAL/PLATELET
Basophils Absolute: 0 10*3/uL (ref 0.0–0.1)
Basophils Relative: 0.5 % (ref 0.0–3.0)
Eosinophils Absolute: 0.1 10*3/uL (ref 0.0–0.7)
Eosinophils Relative: 2.2 % (ref 0.0–5.0)
HCT: 37 % (ref 36.0–46.0)
Hemoglobin: 12.5 g/dL (ref 12.0–15.0)
Lymphocytes Relative: 39.9 % (ref 12.0–46.0)
Lymphs Abs: 1.9 10*3/uL (ref 0.7–4.0)
MCHC: 33.7 g/dL (ref 30.0–36.0)
MCV: 95.1 fl (ref 78.0–100.0)
Monocytes Absolute: 0.5 10*3/uL (ref 0.1–1.0)
Monocytes Relative: 10.2 % (ref 3.0–12.0)
Neutro Abs: 2.2 10*3/uL (ref 1.4–7.7)
Neutrophils Relative %: 47.2 % (ref 43.0–77.0)
Platelets: 287 10*3/uL (ref 150.0–400.0)
RBC: 3.9 Mil/uL (ref 3.87–5.11)
RDW: 13.9 % (ref 11.5–15.5)
WBC: 4.7 10*3/uL (ref 4.0–10.5)

## 2019-06-12 LAB — BASIC METABOLIC PANEL
BUN: 11 mg/dL (ref 6–23)
CO2: 30 mEq/L (ref 19–32)
Calcium: 9.5 mg/dL (ref 8.4–10.5)
Chloride: 105 mEq/L (ref 96–112)
Creatinine, Ser: 0.67 mg/dL (ref 0.40–1.20)
GFR: 86.21 mL/min (ref >60.00)
Glucose, Bld: 103 mg/dL — ABNORMAL HIGH (ref 70–99)
Potassium: 4.6 mEq/L (ref 3.5–5.1)
Sodium: 141 mEq/L (ref 135–145)

## 2019-06-12 LAB — HEPATIC FUNCTION PANEL
ALT: 15 U/L (ref 0–35)
AST: 14 U/L (ref 0–37)
Albumin: 4.4 g/dL (ref 3.5–5.2)
Alkaline Phosphatase: 57 U/L (ref 39–117)
Bilirubin, Direct: 0.2 mg/dL (ref 0.0–0.3)
Total Bilirubin: 1.3 mg/dL — ABNORMAL HIGH (ref 0.2–1.2)
Total Protein: 6.7 g/dL (ref 6.0–8.3)

## 2019-06-12 LAB — LIPID PANEL
Cholesterol: 152 mg/dL (ref 0–200)
HDL: 52.7 mg/dL (ref >39.00)
LDL Cholesterol: 80 mg/dL (ref 0–99)
NonHDL: 98.85
Total CHOL/HDL Ratio: 3
Triglycerides: 96 mg/dL (ref 0.0–149.0)
VLDL: 19.2 mg/dL (ref 0.0–40.0)

## 2019-06-12 LAB — TSH: TSH: 1.52 u[IU]/mL (ref 0.35–4.50)

## 2019-06-12 LAB — MICROALBUMIN / CREATININE URINE RATIO
Creatinine,U: 93.9 mg/dL
Microalb Creat Ratio: 0.7 mg/g (ref 0.0–30.0)
Microalb, Ur: <0.7 mg/dL (ref 0.0–1.9)

## 2019-06-12 LAB — HEMOGLOBIN A1C: Hgb A1c MFr Bld: 6.3 % (ref 4.6–6.5)

## 2019-06-12 LAB — VITAMIN B12: Vitamin B-12: 1200 pg/mL — ABNORMAL HIGH (ref 211–911)

## 2019-06-13 ENCOUNTER — Encounter: Payer: Self-pay | Admitting: Internal Medicine

## 2019-06-16 ENCOUNTER — Encounter: Payer: Medicare Other | Admitting: Internal Medicine

## 2019-06-25 ENCOUNTER — Encounter: Payer: Self-pay | Admitting: Internal Medicine

## 2019-06-26 ENCOUNTER — Other Ambulatory Visit: Payer: Self-pay

## 2019-06-26 MED ORDER — ACYCLOVIR 400 MG PO TABS: ORAL_TABLET | ORAL | 1 refills | Status: DC

## 2019-06-30 ENCOUNTER — Ambulatory Visit: Payer: Medicare Other

## 2019-07-08 DIAGNOSIS — M5136 Other intervertebral disc degeneration, lumbar region: Secondary | ICD-10-CM | POA: Diagnosis not present

## 2019-07-08 DIAGNOSIS — M5416 Radiculopathy, lumbar region: Secondary | ICD-10-CM | POA: Diagnosis not present

## 2019-07-08 DIAGNOSIS — M25511 Pain in right shoulder: Secondary | ICD-10-CM | POA: Diagnosis not present

## 2019-07-08 DIAGNOSIS — G894 Chronic pain syndrome: Secondary | ICD-10-CM | POA: Diagnosis not present

## 2019-07-14 ENCOUNTER — Ambulatory Visit (INDEPENDENT_AMBULATORY_CARE_PROVIDER_SITE_OTHER): Payer: Medicare Other

## 2019-07-14 ENCOUNTER — Other Ambulatory Visit: Payer: Self-pay

## 2019-07-14 DIAGNOSIS — Z0000 Encounter for general adult medical examination without abnormal findings: Secondary | ICD-10-CM | POA: Diagnosis not present

## 2019-07-14 NOTE — Patient Instructions (Addendum)
  Sherry Simon , Thank you for taking time to come for your Medicare Wellness Visit. I appreciate your ongoing commitment to your health goals. Please review the following plan we discussed and let me know if I can assist you in the future.   These are the goals we discussed: Goals      Patient Stated   . Increase physical activity (pt-stated)     I will go to the gym and start the [place] program with a personal trainer to help me target the right exercises for my body.        This is a list of the screening recommended for you and due dates:  Health Maintenance  Topic Date Due  . Tetanus Vaccine  08/31/2017  . Complete foot exam   06/04/2018  . Hemoglobin A1C  12/10/2019  . Mammogram  02/26/2020  . Eye exam for diabetics  06/10/2020  . Colon Cancer Screening  07/15/2022  . DEXA scan (bone density measurement)  Completed  .  Hepatitis C: One time screening is recommended by Center for Disease Control  (CDC) for  adults born from [birth year] through 1965.   Completed  . Flu Shot  Discontinued  . Pneumonia vaccines  Discontinued

## 2019-07-14 NOTE — Progress Notes (Addendum)
Subjective:   Sherry Simon is a 73 y.o. female who presents for Medicare Annual (Subsequent) preventive examination.  Review of Systems:  No ROS.  Medicare Wellness Virtual Visit.  Visual/audio telehealth visit, UTA vital signs.   See social history for additional risk factors.   Cardiac Risk Factors include: advanced age (>68men, >35 women);hypertension;diabetes mellitus     Objective:     Vitals: There were no vitals taken for this visit.  There is no height or weight on file to calculate BMI.  Advanced Directives 07/14/2019 07/24/2017 10/11/2015  Does Patient Have a Medical Advance Directive? Yes Yes Yes  Type of Paramedic of [place];Living will Living will [place];Living will  Does patient want to make changes to medical advance directive? No - Patient declined - No - Patient declined  Copy of [place] in Chart? No - copy requested - No - copy requested    Tobacco Social History   Tobacco Use  Smoking Status Never Smoker  Smokeless Tobacco Never Used     Counseling given: Not Answered   Clinical Intake:  Pre-visit preparation completed: Yes        Diabetes: Yes(Followed by pcp)  How often do you need to have someone help you when you read instructions, pamphlets, or other written materials from your doctor or pharmacy?: 1 - Never  Interpreter Needed?: No     Past Medical History:  Diagnosis Date  . Anxiety and depression   . Colon polyps   . Depression   . Diabetes mellitus ([place])   . Endometriosis    requiring hysterectomy  . History of chicken pox   . Hypercholesterolemia   . Hypertension   . Nephrolithiasis   . Pericarditis    recurrent, unkown origin  . Tachycardia    Past Surgical History:  Procedure Laterality Date  . ABDOMINAL HYSTERECTOMY  1980  . APPENDECTOMY  1055  . BACK SURGERY  1005-2010   laminectomy  . CHOLECYSTECTOMY     open  . OOPHORECTOMY  1982   . TONSILLECTOMY     Family History  Problem Relation Age of Onset  . Heart disease Father        myocardial infarction - died 19  . Thyroid disease Mother   . Transient ischemic attack Mother        multiple  . Breast cancer Mother   . Hyperlipidemia Mother   . Kidney disease Mother   . Diabetes Mother   . Rheumatic fever Sister        mitral valve problems  . Breast cancer Paternal Aunt   . Other Sister        Small vessel disease  . Colon cancer Neg Hx    Social History   Socioeconomic History  . Marital status: Married    Spouse name: Not on file  . Number of children: 3  . Years of education: Not on file  . Highest education level: Not on file  Occupational History  . Not on file  Social Needs  . Financial resource strain: Not hard at all  . Food insecurity    Worry: Never true    Inability: Never true  . Transportation needs    Medical: No    Non-medical: No  Tobacco Use  . Smoking status: Never Smoker  . Smokeless tobacco: Never Used  Substance and Sexual Activity  . Alcohol use: No    Alcohol/week: 0.0 standard drinks  .  Drug use: No  . Sexual activity: Never  Lifestyle  . Physical activity    Days per week: 0 days    Minutes per session: Not on file  . Stress: Not at all  Relationships  . Social Herbalist on phone: Not on file    Gets together: Not on file    Attends religious service: Not on file    Active member of club or organization: Not on file    Attends meetings of clubs or organizations: Not on file    Relationship status: Not on file  Other Topics Concern  . Not on file  Social History Narrative  . Not on file    Outpatient Encounter Medications as of 07/14/2019  Medication Sig  . acyclovir (ZOVIRAX) 400 MG tablet TAKE 1 TABLET BY MOUTH  DAILY  . aspirin 81 MG tablet Take 81 mg by mouth daily.  Marland Kitchen atorvastatin (LIPITOR) 40 MG tablet TAKE 1 TABLET BY MOUTH  DAILY  . Calcium Carbonate-Vitamin D 600-400 MG-UNIT tablet  Take by mouth.  . citalopram (CELEXA) 40 MG tablet TAKE 1 TABLET BY MOUTH  DAILY  . ferrous sulfate 325 (65 FE) MG EC tablet Take 325 mg by mouth daily with breakfast.   . gabapentin (NEURONTIN) 600 MG tablet Take 600 mg by mouth 2 (two) times daily.   Marland Kitchen glucose blood (ONE TOUCH ULTRA TEST) test strip TEST BLOOD SUGAR TWICE A DAY  . HYDROcodone-acetaminophen (NORCO) 10-325 MG tablet TAKE 1 TABLET BY MOUTH THREE TIMES DAILY AS NEEDED FOR CHRONIC PAIN  . latanoprost (XALATAN) 0.005 % ophthalmic solution INSTILL 1 DROP INTO THE  LEFT EYE AT BEDTIME  (REFRIGERATE UNTIL FIRST  OPENED FOR USE) (Patient taking differently: Place 1 drop into both eyes. )  . losartan (COZAAR) 100 MG tablet TAKE 1 TABLET BY MOUTH  DAILY  . metFORMIN (GLUCOPHAGE) 500 MG tablet TAKE 2 TABLETS BY MOUTH  EVERY MORNING AND TAKE 1  TABLET AT SUPPER  . pantoprazole (PROTONIX) 40 MG tablet TAKE 1 TABLET BY MOUTH TWO  TIMES DAILY   No facility-administered encounter medications on file as of 07/14/2019.     Activities of Daily Living In your present state of health, do you have any difficulty performing the following activities: 07/14/2019  Hearing? N  Vision? N  Difficulty concentrating or making decisions? N  Walking or climbing stairs? N  Dressing or bathing? N  Doing errands, shopping? N  Preparing Food and eating ? N  Using the Toilet? N  In the past six months, have you accidently leaked urine? N  Do you have problems with loss of bowel control? N  Managing your Medications? N  Managing your Finances? N  Housekeeping or managing your Housekeeping? N  Some recent data might be hidden    Patient Care Team: Einar Pheasant, MD as PCP - General (Internal Medicine)    Assessment:   This is a routine wellness examination for Sherry Simon.  I connected with patient 07/14/19 at 10:30 AM EDT by an audio enabled telemedicine application and verified that I am speaking with the correct person using two identifiers. Patient  stated full name and DOB. Patient gave permission to continue with virtual visit. Patient's location was at home and Nurse's location was at [place] office.   Health Maintenance Due: -Tdap- discussed; to be completed with doctor in visit or local pharmacy.   -Mammogram- followed by [place] Exam- followed by pcp Hgb A1c- 06/12/19 (6.3)  Update  all pending maintenance due as appropriate.   See completed HM at the end of note.   Eye: Visual acuity not assessed. Virtual visit. Wears corrective lenses. Followed by their ophthalmologist every 3 months. Glaucoma. Retinopathy- none reported.  Dental: Visits every 6 months.    Hearing: Demonstrates normal hearing during visit.  Safety:  Patient feels safe at home- yes Patient does have smoke detectors at home- yes Patient does wear sunscreen or protective clothing when in direct sunlight - yes Patient does wear seat belt when in a moving vehicle - yes Patient drives- yes Adequate lighting in walkways free from debris- yes Grab bars and handrails used as appropriate- yes Ambulates with no assistive device Cell phone on person when ambulating outside of the home- yes  Social: Alcohol intake - no       Smoking history- never   Smokers in home? none Illicit drug use? none  Depression: PHQ 2 &9 complete. See screening below. Denies irritability, anhedonia, sadness/tearfullness.  Stable.   Falls: See screening below.    Medication: Taking as directed and without issues.   Covid-19: Precautions and sickness symptoms discussed. Wears mask, social distancing, hand hygiene as appropriate.   Activities of Daily Living Patient denies needing assistance with: household chores, feeding themselves, getting from bed to chair, getting to the toilet, bathing/showering, dressing, managing money, or preparing meals.   Memory: Patient is alert. Patient denies difficulty focusing or concentrating. Correctly identified the president of the  [place], season and recall. Patient likes to play games on tablet for brain stimulation.  BMI- discussed the importance of a healthy diet, water intake and the benefits of aerobic exercise.  Educational material provided.  Physical activity- limited  Diet: regular Water: she plans to drink more Caffeine: 1 cup of coffee, 3 cups of tea  Other Providers Patient Care Team: Einar Pheasant, MD as PCP - General (Internal Medicine)  Exercise Activities and Dietary recommendations Current Exercise Habits: The patient does not participate in regular exercise at present, Intensity: Mild  Goals      Patient Stated   . Increase physical activity (pt-stated)     I will go to the gym and start the [place] program with a personal trainer to help me target the right exercises for my body.        Fall Risk Fall Risk  07/14/2019 01/04/2017 11/03/2015 10/11/2015 11/04/2014  Falls in the past year? 1 No No No No  Number falls in past yr: 0 - - - -  Injury with Fall? 0 - - - -  Risk for fall due to : Impaired mobility History of fall(s) - - -  Risk for fall due to: Comment Foot drop - - - -  Follow up Falls prevention discussed - - - -   Timed Get Up and Go performed: no, virtual visit  Depression Screen PHQ 2/9 Scores 07/14/2019 02/06/2018 01/04/2017 11/03/2015  PHQ - 2 Score 0 3 0 0  PHQ- 9 Score - 10 - -     Cognitive Function MMSE - Mini Mental State Exam 10/11/2015  Orientation to time 5  Orientation to Place 5  Registration 3  Attention/ Calculation 5  Recall 3  Language- name 2 objects 2  Language- repeat 1  Language- follow 3 step command 3  Language- read & follow direction 1  Write a sentence 1  Copy design 1  Total score 30     6CIT Screen 07/14/2019  What Year? 0 points  What  month? 0 points  What time? 0 points  Count back from 20 0 points  Months in reverse 0 points  Repeat phrase 0 points  Total Score 0    Immunization History  Administered Date(s)  Administered  . Influenza, High Dose Seasonal PF 06/08/2016, 07/02/2017, 07/17/2018  . Influenza,inj,Quad PF,6+ Mos 06/18/2014, 05/23/2015  . Influenza-Unspecified 06/08/2016, 07/02/2017  . Pneumococcal Conjugate-13 06/09/2016  . Pneumococcal-Unspecified 06/08/2016  . Tdap 09/01/2007   Screening Tests Health Maintenance  Topic Date Due  . TETANUS/TDAP  08/31/2017  . FOOT EXAM  06/04/2018  . HEMOGLOBIN A1C  12/10/2019  . MAMMOGRAM  02/26/2020  . OPHTHALMOLOGY EXAM  06/10/2020  . COLONOSCOPY  07/15/2022  . DEXA SCAN  Completed  . Hepatitis C Screening  Completed  . INFLUENZA VACCINE  Discontinued  . PNA vac Low Risk Adult  Discontinued      Plan:   Keep all routine maintenance appointments.   Cpe 07/24/19 @ 830  Medicare Attestation I have personally reviewed: The patient's medical and social history Their use of alcohol, tobacco or illicit drugs Their current medications and supplements The patient's functional ability including ADLs,fall risks, home safety risks, cognitive, and hearing and visual impairment Diet and physical activities Evidence for depression   In addition, I have reviewed and discussed with patient certain preventive protocols, quality metrics, and best practice recommendations. A written personalized care plan for preventive services as well as general preventive health recommendations were provided to patient via mail.     Varney Biles, LPN  [id number]   Reviewed above information.  Agree with assessment and plan.   Dr Nicki Reaper

## 2019-07-24 ENCOUNTER — Other Ambulatory Visit: Payer: Self-pay

## 2019-07-24 ENCOUNTER — Encounter: Payer: Self-pay | Admitting: Internal Medicine

## 2019-07-24 ENCOUNTER — Ambulatory Visit (INDEPENDENT_AMBULATORY_CARE_PROVIDER_SITE_OTHER): Payer: Medicare Other | Admitting: Internal Medicine

## 2019-07-24 VITALS — BP 112/70 | HR 74 | Temp 97.5°F | Resp 16 | Ht 64.0 in | Wt 161.8 lb

## 2019-07-24 DIAGNOSIS — E119 Type 2 diabetes mellitus without complications: Secondary | ICD-10-CM | POA: Diagnosis not present

## 2019-07-24 DIAGNOSIS — E538 Deficiency of other specified B group vitamins: Secondary | ICD-10-CM

## 2019-07-24 DIAGNOSIS — M546 Pain in thoracic spine: Secondary | ICD-10-CM

## 2019-07-24 DIAGNOSIS — N898 Other specified noninflammatory disorders of vagina: Secondary | ICD-10-CM

## 2019-07-24 DIAGNOSIS — F439 Reaction to severe stress, unspecified: Secondary | ICD-10-CM

## 2019-07-24 DIAGNOSIS — K219 Gastro-esophageal reflux disease without esophagitis: Secondary | ICD-10-CM

## 2019-07-24 DIAGNOSIS — Z0000 Encounter for general adult medical examination without abnormal findings: Secondary | ICD-10-CM

## 2019-07-24 DIAGNOSIS — D649 Anemia, unspecified: Secondary | ICD-10-CM | POA: Diagnosis not present

## 2019-07-24 DIAGNOSIS — M25511 Pain in right shoulder: Secondary | ICD-10-CM

## 2019-07-24 DIAGNOSIS — I10 Essential (primary) hypertension: Secondary | ICD-10-CM

## 2019-07-24 DIAGNOSIS — R911 Solitary pulmonary nodule: Secondary | ICD-10-CM

## 2019-07-24 DIAGNOSIS — E7800 Pure hypercholesterolemia, unspecified: Secondary | ICD-10-CM

## 2019-07-24 MED ORDER — NYSTATIN 100000 UNIT/GM EX CREA: 1.0000 "application " | TOPICAL_CREAM | Freq: Two times a day (BID) | CUTANEOUS | 0 refills | Status: DC

## 2019-07-24 NOTE — Progress Notes (Signed)
Patient ID: Sherry Simon, female   DOB: Feb 22, 1946, 73 y.o.   MRN: [id number]   Subjective:    Patient ID: Sherry Simon, female    DOB: [birth year], 73 y.o.   MRN: [id number]  HPI  Patient here for her physical exam.  Still with increased stress.  Discussed with her today.  Still trying to cope with her mother and sister's deaths.  Overall she feels she is handling things relatively well.  Does not feel needs any further intervention. Tries to stay active.  No chest pain.  No sob.  No acid reflux.  No abdominal pain.  Bowels moving.  Followed by neurosurgery for chronic lumbar pain and readiculopathy.  Also with right shoulder pain.  Was referred to ortho - Dr Pershing Proud.  Waiting for appt.  Saw cardiology 04/07/19.  Stable.  Recommended f/u in 6 months.  Had f/u with oncology 02/2019.  CT chest unchanged.  Recommended f/u chest CT in 6-12 months.  Does report some vaginal itching.  Appears to be external - perivaginal area.     Past Medical History:  Diagnosis Date  . Anxiety and depression   . Colon polyps   . Depression   . Diabetes mellitus ([place])   . Endometriosis    requiring hysterectomy  . History of chicken pox   . Hypercholesterolemia   . Hypertension   . Nephrolithiasis   . Pericarditis    recurrent, unkown origin  . Tachycardia    Past Surgical History:  Procedure Laterality Date  . ABDOMINAL HYSTERECTOMY  1980  . APPENDECTOMY  1055  . BACK SURGERY  1005-2010   laminectomy  . CHOLECYSTECTOMY     open  . OOPHORECTOMY  1982  . TONSILLECTOMY     Family History  Problem Relation Age of Onset  . Heart disease Father        myocardial infarction - died 57  . Thyroid disease Mother   . Transient ischemic attack Mother        multiple  . Breast cancer Mother   . Hyperlipidemia Mother   . Kidney disease Mother   . Diabetes Mother   . Rheumatic fever Sister        mitral valve problems  . Breast cancer Paternal Aunt   . Other Sister        Small vessel  disease  . Colon cancer Neg Hx    Social History   Socioeconomic History  . Marital status: Married    Spouse name: Not on file  . Number of children: 3  . Years of education: Not on file  . Highest education level: Not on file  Occupational History  . Not on file  Social Needs  . Financial resource strain: Not hard at all  . Food insecurity    Worry: Never true    Inability: Never true  . Transportation needs    Medical: No    Non-medical: No  Tobacco Use  . Smoking status: Never Smoker  . Smokeless tobacco: Never Used  Substance and Sexual Activity  . Alcohol use: No    Alcohol/week: 0.0 standard drinks  . Drug use: No  . Sexual activity: Never  Lifestyle  . Physical activity    Days per week: 0 days    Minutes per session: Not on file  . Stress: Not at all  Relationships  . Social Herbalist on phone: Not on file    Gets together: Not on file  Attends religious service: Not on file    Active member of club or organization: Not on file    Attends meetings of clubs or organizations: Not on file    Relationship status: Not on file  Other Topics Concern  . Not on file  Social History Narrative  . Not on file    Outpatient Encounter Medications as of 07/24/2019  Medication Sig  . acyclovir (ZOVIRAX) 400 MG tablet TAKE 1 TABLET BY MOUTH  DAILY  . aspirin 81 MG tablet Take 81 mg by mouth daily.  . atorvastatin (LIPITOR) 40 MG tablet TAKE 1 TABLET BY MOUTH  DAILY  . Calcium Carbonate-Vitamin D 600-400 MG-UNIT tablet Take by mouth.  . citalopram (CELEXA) 40 MG tablet TAKE 1 TABLET BY MOUTH  DAILY  . ferrous sulfate 325 (65 FE) MG EC tablet Take 325 mg by mouth daily with breakfast.   . gabapentin (NEURONTIN) 600 MG tablet Take 600 mg by mouth 2 (two) times daily.   . glucose blood (ONE TOUCH ULTRA TEST) test strip TEST BLOOD SUGAR TWICE A DAY  . HYDROcodone-acetaminophen (NORCO) 10-325 MG tablet TAKE 1 TABLET BY MOUTH THREE TIMES DAILY AS NEEDED FOR  CHRONIC PAIN  . latanoprost (XALATAN) 0.005 % ophthalmic solution INSTILL 1 DROP INTO THE  LEFT EYE AT BEDTIME  (REFRIGERATE UNTIL FIRST  OPENED FOR USE) (Patient taking differently: Place 1 drop into both eyes. )  . losartan (COZAAR) 100 MG tablet TAKE 1 TABLET BY MOUTH  DAILY  . metFORMIN (GLUCOPHAGE) 500 MG tablet TAKE 2 TABLETS BY MOUTH  EVERY MORNING AND TAKE 1  TABLET AT SUPPER  . nystatin cream (MYCOSTATIN) Apply 1 application topically 2 (two) times daily.  . pantoprazole (PROTONIX) 40 MG tablet TAKE 1 TABLET BY MOUTH TWO  TIMES DAILY   No facility-administered encounter medications on file as of 07/24/2019.    Review of Systems  Constitutional: Negative for appetite change and unexpected weight change.  HENT: Negative for congestion and sinus pressure.   Eyes: Negative for pain and visual disturbance.  Respiratory: Negative for cough, chest tightness and shortness of breath.   Cardiovascular: Negative for chest pain, palpitations and leg swelling.  Gastrointestinal: Negative for abdominal pain, diarrhea, nausea and vomiting.  Genitourinary: Negative for difficulty urinating and dysuria.       Peri vaginal itching. No vaginal discharge or intravaginal symptoms.   Musculoskeletal: Positive for back pain.       Right shoulder pain as outlined.   Skin: Negative for color change and rash.  Neurological: Negative for dizziness, light-headedness and headaches.  Hematological: Negative for adenopathy. Does not bruise/bleed easily.  Psychiatric/Behavioral: Negative for agitation and dysphoric mood.       Increased stress as outlined.        Objective:    Physical Exam Constitutional:      General: She is not in acute distress.    Appearance: Normal appearance. She is well-developed.  HENT:     Head: Normocephalic and atraumatic.     Right Ear: External ear normal.     Left Ear: External ear normal.  Eyes:     General: No scleral icterus.       Right eye: No discharge.         Left eye: No discharge.     Conjunctiva/sclera: Conjunctivae normal.  Neck:     Musculoskeletal: Neck supple. No muscular tenderness.     Thyroid: No thyromegaly.  Cardiovascular:     Rate and Rhythm: Normal rate   and regular rhythm.  Pulmonary:     Effort: No tachypnea, accessory muscle usage or respiratory distress.     Breath sounds: Normal breath sounds. No decreased breath sounds or wheezing.  Chest:     Breasts:        Right: No inverted nipple, mass, nipple discharge or tenderness (no axillary adenopathy).        Left: No inverted nipple, mass, nipple discharge or tenderness (no axilarry adenopathy).  Abdominal:     General: Bowel sounds are normal.     Palpations: Abdomen is soft.     Tenderness: There is no abdominal tenderness.  Genitourinary:    Comments: Minimal erythema - perivaginal area.   Musculoskeletal:        General: No swelling or tenderness.  Lymphadenopathy:     Cervical: No cervical adenopathy.  Skin:    Findings: No erythema or rash.  Neurological:     Mental Status: She is alert and oriented to person, place, and time.  Psychiatric:        Mood and Affect: Mood normal.        Behavior: Behavior normal.     BP 112/70   Pulse 74   Temp (!) 97.5 F (36.4 C)   Resp 16   Ht 5' 4" (1.626 m)   Wt 161 lb 12.8 oz (73.4 kg)   SpO2 98%   BMI 27.77 kg/m  Wt Readings from Last 3 Encounters:  07/24/19 161 lb 12.8 oz (73.4 kg)  09/25/18 157 lb 3.2 oz (71.3 kg)  07/18/18 161 lb 3.2 oz (73.1 kg)     Lab Results  Component Value Date   WBC 4.7 06/12/2019   HGB 12.5 06/12/2019   HCT 37.0 06/12/2019   PLT 287.0 06/12/2019   GLUCOSE 103 (H) 06/12/2019   CHOL 152 06/12/2019   TRIG 96.0 06/12/2019   HDL 52.70 06/12/2019   LDLCALC 80 06/12/2019   ALT 15 06/12/2019   AST 14 06/12/2019   NA 141 06/12/2019   K 4.6 06/12/2019   CL 105 06/12/2019   CREATININE 0.67 06/12/2019   BUN 11 06/12/2019   CO2 30 06/12/2019   TSH 1.52 06/12/2019   HGBA1C 6.3  06/12/2019   MICROALBUR <0.7 06/12/2019    Ct Chest Wo Contrast  Result Date: 07/31/2017 CLINICAL DATA:  Intermittent left chest pain for the past 6 weeks. History of right breast surgery for breast cancer. EXAM: CT CHEST WITHOUT CONTRAST TECHNIQUE: Multidetector CT imaging of the chest was performed following the standard protocol without IV contrast. COMPARISON:  Chest radiographs dated 07/25/2015 FINDINGS: Cardiovascular: Atheromatous coronary artery calcifications. Normal sized heart. Mediastinum/Nodes: No enlarged mediastinal or axillary lymph nodes. Thyroid gland, trachea, and esophagus demonstrate no significant findings. Lungs/Pleura: 9 mm rounded sub solid nodular opacity in the superior aspect of the superior segment of the left lower lobe. Minimal linear scarring bilaterally. No pleural fluid. Upper Abdomen: Cholecystectomy clips. Musculoskeletal: Thoracic and lower cervical spine degenerative changes. Right breast post lumpectomy changes. IMPRESSION: 1. 9 mm ground-glass nodule in the superior segment of the left lower lobe. Initial follow-up with CT at 6-12 months is recommended to confirm persistence. If persistent, repeat CT is recommended every 2 years until 5 years of stability has been established. This recommendation follows the consensus statement: Guidelines for Management of Incidental Pulmonary Nodules Detected on CT Images: From the Fleischner Society 2017; Radiology 2017; 284:228-243. 2. Otherwise, unremarkable examination. Electronically Signed   By: Steven  Reid M.D.   On: 07/31/2017   10:10       Assessment & Plan:   Problem List Items Addressed This Visit    Anemia    Follow cbc.  Colonoscopy 06/2017.  Recommended f/u in 5 years.       B12 deficiency    Recent B12 level ok.       Back pain    Chronic back pain as outlined.  Followed by neurosurgery.        Diabetes mellitus ([place])    Low carb diet and exercise.  Follow met b and a1c.       Relevant Orders    Hemoglobin R7E   Basic metabolic panel (future)   GERD (gastroesophageal reflux disease)    Controlled on protonix.        Health care maintenance    Physical today 07/24/19.  Mammogram 02/26/19 - Birads II.  Colonoscopy 10/2-18 - f/u 5 years.        Hypercholesterolemia    On lipitor.  Low cholesterol diet and exercise.  Follow lipid panel and liver function tests.        Relevant Orders   Hepatic function panel   Lipid panel   Hypertension    Blood pressure under good control.  Continue same medication regimen.  Follow pressures.  Follow metabolic panel.        Lung nodule    Being followed by oncology.  Had f/u 02/2019.  Stable.  Recommended f/u in 6-12 months.        Right shoulder pain    Persistent.  Has been referred to ortho.       Stress    Increased stress as outlined.  Discussed with her today. She does not feel needs any further intervention.  Follow.        Vaginal irritation    Nystatin cream as directed.  Update over the next week.  If persistent symptoms, refer to gyn.        Other Visit Diagnoses    Routine general medical examination at a health care facility    -  Primary       Einar Pheasant, MD

## 2019-07-24 NOTE — Assessment & Plan Note (Signed)
Physical today 07/24/19.  Mammogram 02/26/19 - Birads II.  Colonoscopy 10/2-18 - f/u 5 years.

## 2019-08-01 ENCOUNTER — Encounter: Payer: Self-pay | Admitting: Internal Medicine

## 2019-08-01 DIAGNOSIS — N898 Other specified noninflammatory disorders of vagina: Secondary | ICD-10-CM | POA: Insufficient documentation

## 2019-08-01 DIAGNOSIS — M25511 Pain in right shoulder: Secondary | ICD-10-CM | POA: Insufficient documentation

## 2019-08-01 NOTE — Assessment & Plan Note (Signed)
On lipitor.  Low cholesterol diet and exercise.  Follow lipid panel and liver function tests.   

## 2019-08-01 NOTE — Assessment & Plan Note (Signed)
Persistent.  Has been referred to ortho.

## 2019-08-01 NOTE — Assessment & Plan Note (Signed)
Blood pressure under good control.  Continue same medication regimen.  Follow pressures.  Follow metabolic panel.   

## 2019-08-01 NOTE — Assessment & Plan Note (Signed)
Low carb diet and exercise.  Follow met b and a1c.  

## 2019-08-01 NOTE — Assessment & Plan Note (Signed)
Follow cbc.  Colonoscopy 06/2017.  Recommended f/u in 5 years.

## 2019-08-01 NOTE — Assessment & Plan Note (Addendum)
Recent B12 level ok.

## 2019-08-01 NOTE — Assessment & Plan Note (Signed)
Increased stress as outlined.  Discussed with her today.  She does not feel needs any further intervention.  Follow.   

## 2019-08-01 NOTE — Assessment & Plan Note (Signed)
Being followed by oncology.  Had f/u 02/2019.  Stable.  Recommended f/u in 6-12 months.

## 2019-08-01 NOTE — Assessment & Plan Note (Signed)
Nystatin cream as directed.  Update over the next week.  If persistent symptoms, refer to gyn.

## 2019-08-01 NOTE — Assessment & Plan Note (Signed)
Controlled on protonix.   

## 2019-08-01 NOTE — Assessment & Plan Note (Signed)
Chronic back pain as outlined.  Followed by neurosurgery.

## 2019-08-05 DIAGNOSIS — M4802 Spinal stenosis, cervical region: Secondary | ICD-10-CM | POA: Diagnosis not present

## 2019-08-05 DIAGNOSIS — M5412 Radiculopathy, cervical region: Secondary | ICD-10-CM | POA: Diagnosis not present

## 2019-08-05 DIAGNOSIS — M5416 Radiculopathy, lumbar region: Secondary | ICD-10-CM | POA: Diagnosis not present

## 2019-08-05 DIAGNOSIS — G894 Chronic pain syndrome: Secondary | ICD-10-CM | POA: Diagnosis not present

## 2019-08-05 DIAGNOSIS — M5136 Other intervertebral disc degeneration, lumbar region: Secondary | ICD-10-CM | POA: Diagnosis not present

## 2019-08-05 DIAGNOSIS — M5030 Other cervical disc degeneration, unspecified cervical region: Secondary | ICD-10-CM | POA: Diagnosis not present

## 2019-08-06 DIAGNOSIS — M7541 Impingement syndrome of right shoulder: Secondary | ICD-10-CM | POA: Diagnosis not present

## 2019-08-06 DIAGNOSIS — M25511 Pain in right shoulder: Secondary | ICD-10-CM | POA: Diagnosis not present

## 2019-08-14 DIAGNOSIS — M4722 Other spondylosis with radiculopathy, cervical region: Secondary | ICD-10-CM | POA: Diagnosis not present

## 2019-08-14 DIAGNOSIS — M5010 Cervical disc disorder with radiculopathy, unspecified cervical region: Secondary | ICD-10-CM | POA: Diagnosis not present

## 2019-08-14 DIAGNOSIS — M4802 Spinal stenosis, cervical region: Secondary | ICD-10-CM | POA: Diagnosis not present

## 2019-08-14 DIAGNOSIS — M5412 Radiculopathy, cervical region: Secondary | ICD-10-CM | POA: Diagnosis not present

## 2019-08-14 DIAGNOSIS — M5030 Other cervical disc degeneration, unspecified cervical region: Secondary | ICD-10-CM | POA: Diagnosis not present

## 2019-08-17 DIAGNOSIS — M5412 Radiculopathy, cervical region: Secondary | ICD-10-CM | POA: Diagnosis not present

## 2019-08-17 DIAGNOSIS — G894 Chronic pain syndrome: Secondary | ICD-10-CM | POA: Diagnosis not present

## 2019-08-17 DIAGNOSIS — M5136 Other intervertebral disc degeneration, lumbar region: Secondary | ICD-10-CM | POA: Diagnosis not present

## 2019-08-17 DIAGNOSIS — M5030 Other cervical disc degeneration, unspecified cervical region: Secondary | ICD-10-CM | POA: Diagnosis not present

## 2019-08-26 DIAGNOSIS — M4802 Spinal stenosis, cervical region: Secondary | ICD-10-CM | POA: Diagnosis not present

## 2019-08-26 DIAGNOSIS — M9961 Osseous and subluxation stenosis of intervertebral foramina of cervical region: Secondary | ICD-10-CM | POA: Diagnosis not present

## 2019-08-26 DIAGNOSIS — M4722 Other spondylosis with radiculopathy, cervical region: Secondary | ICD-10-CM | POA: Diagnosis not present

## 2019-08-26 DIAGNOSIS — R292 Abnormal reflex: Secondary | ICD-10-CM | POA: Diagnosis not present

## 2019-09-01 ENCOUNTER — Telehealth: Payer: Self-pay | Admitting: Internal Medicine

## 2019-09-01 ENCOUNTER — Other Ambulatory Visit: Payer: Self-pay

## 2019-09-01 ENCOUNTER — Ambulatory Visit (INDEPENDENT_AMBULATORY_CARE_PROVIDER_SITE_OTHER): Payer: Medicare Other | Admitting: Internal Medicine

## 2019-09-01 DIAGNOSIS — Z20828 Contact with and (suspected) exposure to other viral communicable diseases: Secondary | ICD-10-CM

## 2019-09-01 DIAGNOSIS — Z20822 Contact with and (suspected) exposure to covid-19: Secondary | ICD-10-CM

## 2019-09-01 DIAGNOSIS — N3001 Acute cystitis with hematuria: Secondary | ICD-10-CM | POA: Diagnosis not present

## 2019-09-01 MED ORDER — CEFDINIR 300 MG PO CAPS: 300.0000 mg | ORAL_CAPSULE | Freq: Two times a day (BID) | ORAL | 0 refills | Status: DC

## 2019-09-01 NOTE — Telephone Encounter (Signed)
UTI sx started Saturday. Painful urination, frequency. Using azo. No vaginal symptoms. Daughter tested positive for COVID on Sunday 12/6. Was with daughter all day Friday and Saturday 12/4 and 12/5. Daughter lost taste and smell on Sunday morning. Patient has not been tested. Pt scheduled for virtual today at 4:30.

## 2019-09-01 NOTE — Telephone Encounter (Signed)
Possible UTI and Patient has been exposed by her daughter that tested positive for COVID. Please advise.

## 2019-09-01 NOTE — Progress Notes (Signed)
Patient ID: Sherry Simon, female   DOB: 1946/09/14, 73 y.o.   MRN: [id number]   Virtual Visit via virtual Note  This visit type was conducted due to national recommendations for restrictions regarding the COVID-19 pandemic (e.g. social distancing).  This format is felt to be most appropriate for this patient at this time.  All issues noted in this document were discussed and addressed.  No physical exam was performed (except for noted visual exam findings with Video Visits).   I connected with [name] by a video enabled telemedicine application or telephone and verified that I am speaking with the correct person using two identifiers. Location patient: home Location provider: work  Persons participating in the virtual visit: patient, provider  I discussed the limitations, risks, security and privacy concerns of performing an evaluation and management service by video and the availability of in person appointments.  The patient expressed understanding and agreed to proceed.   Reason for visit: work in appt  HPI: Reports symptoms started three days ago.  Reports dysuria and increased frequency.  No fever.  No vomiting.  Some nausea with some acid reflux.  No abdominal pain.  Did not report diarrhea.  Daughter tested positive for covid.  She reports no chest congestion, cough or sob.  Took AZO.  Helped.     ROS: See pertinent positives and negatives per HPI.  Past Medical History:  Diagnosis Date  . Anxiety and depression   . Colon polyps   . Depression   . Diabetes mellitus ([place])   . Endometriosis    requiring hysterectomy  . History of chicken pox   . Hypercholesterolemia   . Hypertension   . Nephrolithiasis   . Pericarditis    recurrent, unkown origin  . Tachycardia     Past Surgical History:  Procedure Laterality Date  . ABDOMINAL HYSTERECTOMY  1980  . APPENDECTOMY  1055  . BACK SURGERY  1005-2010   laminectomy  . CHOLECYSTECTOMY     open  . OOPHORECTOMY   1982  . TONSILLECTOMY      Family History  Problem Relation Age of Onset  . Heart disease Father        myocardial infarction - died 29  . Thyroid disease Mother   . Transient ischemic attack Mother        multiple  . Breast cancer Mother   . Hyperlipidemia Mother   . Kidney disease Mother   . Diabetes Mother   . Rheumatic fever Sister        mitral valve problems  . Breast cancer Paternal Aunt   . Other Sister        Small vessel disease  . Colon cancer Neg Hx     SOCIAL HX: reviewed.    Current Outpatient Medications:  .  acyclovir (ZOVIRAX) 400 MG tablet, TAKE 1 TABLET BY MOUTH  DAILY, Disp: 90 tablet, Rfl: 1 .  aspirin 81 MG tablet, Take 81 mg by mouth daily., Disp: , Rfl:  .  atorvastatin (LIPITOR) 40 MG tablet, TAKE 1 TABLET BY MOUTH  DAILY, Disp: 90 tablet, Rfl: 1 .  Calcium Carbonate-Vitamin D 600-400 MG-UNIT tablet, Take by mouth., Disp: , Rfl:  .  cefdinir (OMNICEF) 300 MG capsule, Take 1 capsule (300 mg total) by mouth 2 (two) times daily., Disp: 10 capsule, Rfl: 0 .  citalopram (CELEXA) 40 MG tablet, TAKE 1 TABLET BY MOUTH  DAILY, Disp: 90 tablet, Rfl: 1 .  ferrous sulfate 325 (65 FE) MG  EC tablet, Take 325 mg by mouth daily with breakfast. , Disp: , Rfl:  .  gabapentin (NEURONTIN) 600 MG tablet, Take 600 mg by mouth 2 (two) times daily. , Disp: , Rfl:  .  glucose blood (ONE TOUCH ULTRA TEST) test strip, TEST BLOOD SUGAR TWICE A DAY, Disp: 200 each, Rfl: PRN .  HYDROcodone-acetaminophen (NORCO) 10-325 MG tablet, TAKE 1 TABLET BY MOUTH THREE TIMES DAILY AS NEEDED FOR CHRONIC PAIN, Disp: , Rfl: 0 .  latanoprost (XALATAN) 0.005 % ophthalmic solution, INSTILL 1 DROP INTO THE  LEFT EYE AT BEDTIME  (REFRIGERATE UNTIL FIRST  OPENED FOR USE) (Patient taking differently: Place 1 drop into both eyes. ), Disp: 7.5 mL, Rfl: 3 .  metFORMIN (GLUCOPHAGE) 500 MG tablet, TAKE 2 TABLETS BY MOUTH  EVERY MORNING AND TAKE 1  TABLET AT SUPPER, Disp: 270 tablet, Rfl: 3 .  nystatin cream  (MYCOSTATIN), Apply 1 application topically 2 (two) times daily., Disp: 30 g, Rfl: 0 .  ondansetron (ZOFRAN ODT) 4 MG disintegrating tablet, Take 1 tablet (4 mg total) by mouth every 8 (eight) hours as needed for nausea or vomiting., Disp: 15 tablet, Rfl: 0 .  pantoprazole (PROTONIX) 40 MG tablet, TAKE 1 TABLET BY MOUTH TWO  TIMES DAILY, Disp: 180 tablet, Rfl: 1  EXAM:  GENERAL: alert, oriented, appears well and in no acute distress  HEENT: atraumatic, conjunttiva clear, no obvious abnormalities on inspection of external nose and ears  NECK: normal movements of the head and neck  LUNGS: on inspection no signs of respiratory distress, breathing rate appears normal, no obvious gross SOB, gasping or wheezing  CV: no obvious cyanosis  PSYCH/NEURO: pleasant and cooperative, no obvious depression or anxiety, speech and thought processing grossly intact  ASSESSMENT AND PLAN:  Discussed the following assessment and plan:  UTI (urinary tract infection) Symptoms consistent with UTI.  Unable to collect urine sample given covid restrictions.  Feels similar to previous UTIs.  Persistent symptoms.  Will treat with omnicef as directed.  Probiotics as directed.  Follow.    Exposure to COVID-19 virus Exposure - daughter positive.  No fever.  No sob, cough or congestion.  Test for covid.  Follow closely.      I discussed the assessment and treatment plan with the patient. The patient was provided an opportunity to ask questions and all were answered. The patient agreed with the plan and demonstrated an understanding of the instructions.   The patient was advised to call back or seek an in-person evaluation if the symptoms worsen or if the condition fails to improve as anticipated.   Einar Pheasant, MD

## 2019-09-02 DIAGNOSIS — M5136 Other intervertebral disc degeneration, lumbar region: Secondary | ICD-10-CM | POA: Diagnosis not present

## 2019-09-02 DIAGNOSIS — M5412 Radiculopathy, cervical region: Secondary | ICD-10-CM | POA: Diagnosis not present

## 2019-09-02 DIAGNOSIS — G894 Chronic pain syndrome: Secondary | ICD-10-CM | POA: Diagnosis not present

## 2019-09-02 DIAGNOSIS — M5416 Radiculopathy, lumbar region: Secondary | ICD-10-CM | POA: Diagnosis not present

## 2019-09-03 ENCOUNTER — Other Ambulatory Visit: Payer: Self-pay

## 2019-09-03 ENCOUNTER — Ambulatory Visit
Admission: EM | Admit: 2019-09-03 | Discharge: 2019-09-03 | Disposition: A | Discharge status: Home / Self Care | Payer: Medicare Other | Attending: Urgent Care | Admitting: Urgent Care

## 2019-09-03 DIAGNOSIS — R5383 Other fatigue: Secondary | ICD-10-CM | POA: Diagnosis not present

## 2019-09-03 DIAGNOSIS — R0981 Nasal congestion: Secondary | ICD-10-CM | POA: Diagnosis not present

## 2019-09-03 DIAGNOSIS — Z20828 Contact with and (suspected) exposure to other viral communicable diseases: Secondary | ICD-10-CM

## 2019-09-03 DIAGNOSIS — B349 Viral infection, unspecified: Secondary | ICD-10-CM | POA: Diagnosis not present

## 2019-09-03 DIAGNOSIS — R110 Nausea: Secondary | ICD-10-CM

## 2019-09-03 DIAGNOSIS — R519 Headache, unspecified: Secondary | ICD-10-CM | POA: Diagnosis not present

## 2019-09-03 DIAGNOSIS — Z20822 Contact with and (suspected) exposure to covid-19: Secondary | ICD-10-CM

## 2019-09-03 MED ORDER — ONDANSETRON 4 MG PO TBDP: 4.0000 mg | ORAL_TABLET | Freq: Three times a day (TID) | ORAL | 0 refills | Status: DC | PRN

## 2019-09-03 NOTE — ED Triage Notes (Signed)
Patient complains of sore throat, headache, nausea that is intermittent, fatigue and body aches. Patient states that her symptoms started on Tuesday. Reports that her daughter is positive for Covid and she is concerned for this.

## 2019-09-03 NOTE — Discharge Instructions (Signed)
Take medication as prescribed. Rest. Drink plenty of fluids.  ° °Follow up with your primary care physician this week as needed. Return to Urgent care for new or worsening concerns.  ° °

## 2019-09-03 NOTE — ED Provider Notes (Signed)
[facility] ____________________________________________  Time seen: Approximately 4:10 PM  I have reviewed the triage vital signs and the nursing notes.   HISTORY  Chief Complaint Fatigue and Covid exposure   HPI Sherry Simon is a 72 y.o. female presenting for evaluation of 2 days of intermittent headache, intermittent fatigue and body aches, congestion and nausea.  States the nausea bothers her the most.  No accompanying abdominal pain.  Denies vomiting or diarrhea.  Denies current sore throat.  States throat discomfort has been more scratchy.  No cough.  Denies known fevers.  Reports she had been around her daughter this past week who just received positive COVID-19 testing report this weekend.  Denies aggravating alleviating factors.  States it was not for Covid she would otherwise be here.  Reports otherwise doing well.  Einar Pheasant, MD : PCP   Past Medical History:  Diagnosis Date  . Anxiety and depression   . Colon polyps   . Depression   . Diabetes mellitus ([place])   . Endometriosis    requiring hysterectomy  . History of chicken pox   . Hypercholesterolemia   . Hypertension   . Nephrolithiasis   . Pericarditis    recurrent, unkown origin  . Tachycardia     Patient Active Problem List   Diagnosis Date Noted  . Exposure to COVID-19 virus 09/01/2019  . Right shoulder pain 08/01/2019  . Vaginal irritation 08/01/2019  . Lung nodule 02/16/2018  . Health care maintenance 10/27/2017  . Anemia 07/29/2017  . Back pain 07/29/2017  . SOB (shortness of breath) 07/29/2017  . B12 deficiency 07/02/2017  . 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 Hot flashes 09/05/2015  . Palpitations 12/23/2014  . Chest pain 12/23/2014  . Stress 11/07/2014  . Joint stiffness of hand 06/20/2014  . Rotator cuff tear 10/18/2013  . Thyroid nodule 10/18/2013  . History of colonic polyps 07/05/2013  . Breast cancer ([place]) 02/02/2013  . Chronic back pain 08/24/2012  . GERD  (gastroesophageal reflux disease) 08/24/2012  . Hypercholesterolemia 08/24/2012  . Diabetes mellitus ([place]) 08/24/2012  . Hypertension 08/24/2012    Past Surgical History:  Procedure Laterality Date  . ABDOMINAL HYSTERECTOMY  1980  . APPENDECTOMY  1055  . BACK SURGERY  1005-2010   laminectomy  . CHOLECYSTECTOMY     open  . OOPHORECTOMY  1982  . TONSILLECTOMY       No current facility-administered medications for this encounter.  Current Outpatient Medications:  .  acyclovir (ZOVIRAX) 400 MG tablet, TAKE 1 TABLET BY MOUTH  DAILY, Disp: 90 tablet, Rfl: 1 .  aspirin 81 MG tablet, Take 81 mg by mouth daily., Disp: , Rfl:  .  atorvastatin (LIPITOR) 40 MG tablet, TAKE 1 TABLET BY MOUTH  DAILY, Disp: 90 tablet, Rfl: 1 .  Calcium Carbonate-Vitamin D 600-400 MG-UNIT tablet, Take by mouth., Disp: , Rfl:  .  cefdinir (OMNICEF) 300 MG capsule, Take 1 capsule (300 mg total) by mouth 2 (two) times daily., Disp: 10 capsule, Rfl: 0 .  citalopram (CELEXA) 40 MG tablet, TAKE 1 TABLET BY MOUTH  DAILY, Disp: 90 tablet, Rfl: 1 .  ferrous sulfate 325 (65 FE) MG EC tablet, Take 325 mg by mouth daily with breakfast. , Disp: , Rfl:  .  gabapentin (NEURONTIN) 600 MG tablet, Take 600 mg by mouth 2 (two) times daily. , Disp: , Rfl:  .  glucose blood (ONE TOUCH ULTRA TEST) test strip, TEST BLOOD SUGAR TWICE A DAY, Disp: 200 each, Rfl: PRN .  HYDROcodone-acetaminophen (NORCO) 10-325 MG tablet, TAKE 1 TABLET BY MOUTH THREE TIMES DAILY AS NEEDED FOR CHRONIC PAIN, Disp: , Rfl: 0 .  latanoprost (XALATAN) 0.005 % ophthalmic solution, INSTILL 1 DROP INTO THE  LEFT EYE AT BEDTIME  (REFRIGERATE UNTIL FIRST  OPENED FOR USE) (Patient taking differently: Place 1 drop into both eyes. ), Disp: 7.5 mL, Rfl: 3 .  metFORMIN (GLUCOPHAGE) 500 MG tablet, TAKE 2 TABLETS BY MOUTH  EVERY MORNING AND TAKE 1  TABLET AT SUPPER, Disp: 270 tablet, Rfl: 3 .  nystatin cream (MYCOSTATIN), Apply 1 application topically 2 (two) times daily.,  Disp: 30 g, Rfl: 0 .  pantoprazole (PROTONIX) 40 MG tablet, TAKE 1 TABLET BY MOUTH TWO  TIMES DAILY, Disp: 180 tablet, Rfl: 1 .  ondansetron (ZOFRAN ODT) 4 MG disintegrating tablet, Take 1 tablet (4 mg total) by mouth every 8 (eight) hours as needed for nausea or vomiting., Disp: 15 tablet, Rfl: 0  Allergies Ivp dye [iodinated diagnostic agents]  Family History  Problem Relation Age of Onset  . Heart disease Father        myocardial infarction - died 103  . Thyroid disease Mother   . Transient ischemic attack Mother        multiple  . Breast cancer Mother   . Hyperlipidemia Mother   . Kidney disease Mother   . Diabetes Mother   . Rheumatic fever Sister        mitral valve problems  . Breast cancer Paternal Aunt   . Other Sister        Small vessel disease  . Colon cancer Neg Hx     Social History Social History   Tobacco Use  . Smoking status: Never Smoker  . Smokeless tobacco: Never Used  Substance Use Topics  . Alcohol use: No    Alcohol/week: 0.0 standard drinks  . Drug use: No    Review of Systems Constitutional: No fever ENT: As above.  Cardiovascular: Denies chest pain. Respiratory: Denies shortness of breath. Gastrointestinal: No abdominal pain.  Positive nausea, no vomiting.  No diarrhea.   Genitourinary: Negative for dysuria. Musculoskeletal: Negative for back pain. Skin: Negative for rash.   ____________________________________________   PHYSICAL EXAM:  VITAL SIGNS: ED Triage Vitals  Enc Vitals Group     BP 09/03/19 1537 120/89     Pulse Rate 09/03/19 1537 82     Resp 09/03/19 1537 16     Temp 09/03/19 1537 98.3 F (36.8 C)     Temp Source 09/03/19 1537 Oral     SpO2 09/03/19 1537 96 %     Weight 09/03/19 1538 155 lb (70.3 kg)     Height 09/03/19 1538 5\' 4"  (1.626 m)     Head Circumference --      Peak Flow --      Pain Score 09/03/19 1606 0     Pain Loc --      Pain Edu? --      Excl. in [place]? --    Constitutional: Alert and oriented.  Well appearing and in no acute distress. Eyes: Conjunctivae are normal.  Head: Atraumatic. No sinus tenderness to palpation. No swelling. No erythema.  Ears: no erythema, normal TMs bilaterally.   Nose:Nasal congestion with clear rhinorrhea  Mouth/Throat: Mucous membranes are moist. Mild pharyngeal erythema. No tonsillar swelling or exudate.  Neck: No stridor.  No cervical spine tenderness to palpation. Hematological/Lymphatic/Immunilogical: No cervical lymphadenopathy. Cardiovascular: Normal rate, regular rhythm. Grossly normal heart sounds.  Good  peripheral circulation. Respiratory: Normal respiratory effort.  No retractions. No wheezes, rales or rhonchi. Good air movement.  Gastrointestinal: Soft and nontender.  Musculoskeletal: Ambulatory with steady gait.  Neurologic:  Normal speech and language. No gait instability. Skin:  Skin appears warm, dry and intact. No rash noted. Psychiatric: Mood and affect are normal. Speech and behavior are normal.  ___________________________________________   LABS (all labs ordered are listed, but only abnormal results are displayed)  Labs Reviewed  NOVEL CORONAVIRUS, NAA (HOSP ORDER, SEND-OUT TO REF LAB; TAT 18-24 HRS)   ____________________________________________   PROCEDURES Procedures   INITIAL IMPRESSION / ASSESSMENT AND PLAN / ED COURSE  Pertinent labs & imaging results that were available during my care of the patient were reviewed by me and considered in my medical decision making (see chart for details).  Well-appearing patient.  No acute distress.  Suspect viral illness.  COVID-19 testing completed and advice given.  As needed Zofran.  Encourage rest, fluids, supportive care monitoring.Discussed indication, risks and benefits of medications with patient.   Discussed follow up with Primary care physician this week. Discussed follow up and return parameters including no resolution or any worsening concerns. Patient verbalized  understanding and agreed to plan.   ____________________________________________   FINAL CLINICAL IMPRESSION(S) / ED DIAGNOSES  Final diagnoses:  Viral illness  Exposure to COVID-19 virus     ED Discharge Orders         Ordered    ondansetron (ZOFRAN ODT) 4 MG disintegrating tablet  Every 8 hours PRN     09/03/19 1559           Note: This dictation was prepared with Dragon dictation along with smaller phrase technology. Any transcriptional errors that result from this process are unintentional.         Marylene Land, NP 09/03/19 1647

## 2019-09-04 LAB — NOVEL CORONAVIRUS, NAA (HOSP ORDER, SEND-OUT TO REF LAB; TAT 18-24 HRS): SARS-CoV-2, NAA: NOT DETECTED

## 2019-09-05 ENCOUNTER — Encounter: Payer: Self-pay | Admitting: Internal Medicine

## 2019-09-05 NOTE — Assessment & Plan Note (Signed)
Symptoms consistent with UTI.  Unable to collect urine sample given covid restrictions.  Feels similar to previous UTIs.  Persistent symptoms.  Will treat with omnicef as directed.  Probiotics as directed.  Follow.

## 2019-09-05 NOTE — Assessment & Plan Note (Signed)
Exposure - daughter positive.  No fever.  No sob, cough or congestion.  Test for covid.  Follow closely.

## 2019-09-09 ENCOUNTER — Other Ambulatory Visit
Admission: RE | Admit: 2019-09-09 | Discharge: 2019-09-09 | Disposition: A | Discharge status: Home / Self Care | Payer: Medicare Other | Attending: Internal Medicine | Admitting: Internal Medicine

## 2019-09-09 ENCOUNTER — Other Ambulatory Visit: Payer: Self-pay

## 2019-09-09 ENCOUNTER — Telehealth: Payer: Self-pay | Admitting: Internal Medicine

## 2019-09-09 DIAGNOSIS — R300 Dysuria: Secondary | ICD-10-CM | POA: Diagnosis not present

## 2019-09-09 LAB — URINALYSIS, ROUTINE W REFLEX MICROSCOPIC
Specific Gravity, Urine: 1.01 (ref 1.005–1.030)
pH: 6 (ref 5.0–8.0)

## 2019-09-09 LAB — URINALYSIS, MICROSCOPIC (REFLEX): WBC, UA: >50 WBC/hpf (ref 0–5)

## 2019-09-09 NOTE — Telephone Encounter (Signed)
Pt was seen on 12/8 for frequency and burning. Treated with omnicef x 5 days. Completed abx beginning of week (Monday I think.) Symptoms have returned. They were better while on abx but returned the day after stopping. Pt did not leave urine sample prior to being treated due to being exposed to [place]. COVID test negative. Advised she will need to leave a urine so we can run a culture. Discussed with Dr Nicki Reaper. Urine orders placed future. Pt would like to give specimen in [place]. Called and left her a message letting her know that she could go this afternoon or in the morning to leave the specimen.

## 2019-09-09 NOTE — Telephone Encounter (Signed)
Patient was on a antibiotic for 5 days, symptoms have returned, pain when going to the bathroom. Patient feels she needs another dose.

## 2019-09-10 LAB — URINE CULTURE: Culture: NO GROWTH

## 2019-09-28 ENCOUNTER — Other Ambulatory Visit: Payer: Self-pay | Admitting: Internal Medicine

## 2019-09-28 DIAGNOSIS — M50122 Cervical disc disorder at C5-C6 level with radiculopathy: Secondary | ICD-10-CM | POA: Diagnosis not present

## 2019-09-28 DIAGNOSIS — R292 Abnormal reflex: Secondary | ICD-10-CM | POA: Diagnosis not present

## 2019-09-28 DIAGNOSIS — M4722 Other spondylosis with radiculopathy, cervical region: Secondary | ICD-10-CM | POA: Diagnosis not present

## 2019-09-28 DIAGNOSIS — I10 Essential (primary) hypertension: Secondary | ICD-10-CM

## 2019-09-28 DIAGNOSIS — M4312 Spondylolisthesis, cervical region: Secondary | ICD-10-CM | POA: Diagnosis not present

## 2019-09-28 DIAGNOSIS — K219 Gastro-esophageal reflux disease without esophagitis: Secondary | ICD-10-CM

## 2019-09-28 DIAGNOSIS — M4802 Spinal stenosis, cervical region: Secondary | ICD-10-CM | POA: Diagnosis not present

## 2019-09-30 DIAGNOSIS — M5136 Other intervertebral disc degeneration, lumbar region: Secondary | ICD-10-CM | POA: Diagnosis not present

## 2019-09-30 DIAGNOSIS — M5412 Radiculopathy, cervical region: Secondary | ICD-10-CM | POA: Diagnosis not present

## 2019-09-30 DIAGNOSIS — G894 Chronic pain syndrome: Secondary | ICD-10-CM | POA: Diagnosis not present

## 2019-09-30 DIAGNOSIS — M5416 Radiculopathy, lumbar region: Secondary | ICD-10-CM | POA: Diagnosis not present

## 2019-10-01 DIAGNOSIS — M6281 Muscle weakness (generalized): Secondary | ICD-10-CM | POA: Diagnosis not present

## 2019-10-01 DIAGNOSIS — M4802 Spinal stenosis, cervical region: Secondary | ICD-10-CM | POA: Diagnosis not present

## 2019-10-28 DIAGNOSIS — M4722 Other spondylosis with radiculopathy, cervical region: Secondary | ICD-10-CM | POA: Diagnosis not present

## 2019-10-28 DIAGNOSIS — M5416 Radiculopathy, lumbar region: Secondary | ICD-10-CM | POA: Diagnosis not present

## 2019-10-28 DIAGNOSIS — G894 Chronic pain syndrome: Secondary | ICD-10-CM | POA: Diagnosis not present

## 2019-11-09 DIAGNOSIS — G5622 Lesion of ulnar nerve, left upper limb: Secondary | ICD-10-CM | POA: Diagnosis not present

## 2019-11-09 DIAGNOSIS — M5134 Other intervertebral disc degeneration, thoracic region: Secondary | ICD-10-CM | POA: Diagnosis not present

## 2019-11-09 DIAGNOSIS — M4722 Other spondylosis with radiculopathy, cervical region: Secondary | ICD-10-CM | POA: Diagnosis not present

## 2019-11-09 DIAGNOSIS — M4802 Spinal stenosis, cervical region: Secondary | ICD-10-CM | POA: Diagnosis not present

## 2019-11-19 ENCOUNTER — Other Ambulatory Visit: Payer: Self-pay

## 2019-11-19 ENCOUNTER — Other Ambulatory Visit (INDEPENDENT_AMBULATORY_CARE_PROVIDER_SITE_OTHER): Payer: Medicare Other

## 2019-11-19 DIAGNOSIS — E7800 Pure hypercholesterolemia, unspecified: Secondary | ICD-10-CM

## 2019-11-19 DIAGNOSIS — E119 Type 2 diabetes mellitus without complications: Secondary | ICD-10-CM

## 2019-11-19 LAB — LIPID PANEL
Cholesterol: 136 mg/dL (ref 0–200)
HDL: 46.3 mg/dL (ref >39.00)
LDL Cholesterol: 73 mg/dL (ref 0–99)
NonHDL: 89.32
Total CHOL/HDL Ratio: 3
Triglycerides: 82 mg/dL (ref 0.0–149.0)
VLDL: 16.4 mg/dL (ref 0.0–40.0)

## 2019-11-19 LAB — HEPATIC FUNCTION PANEL
ALT: 14 U/L (ref 0–35)
AST: 12 U/L (ref 0–37)
Albumin: 4.2 g/dL (ref 3.5–5.2)
Alkaline Phosphatase: 56 U/L (ref 39–117)
Bilirubin, Direct: 0.2 mg/dL (ref 0.0–0.3)
Total Bilirubin: 1.3 mg/dL — ABNORMAL HIGH (ref 0.2–1.2)
Total Protein: 6.6 g/dL (ref 6.0–8.3)

## 2019-11-19 LAB — BASIC METABOLIC PANEL
BUN: 11 mg/dL (ref 6–23)
CO2: 29 mEq/L (ref 19–32)
Calcium: 9.6 mg/dL (ref 8.4–10.5)
Chloride: 104 mEq/L (ref 96–112)
Creatinine, Ser: 0.67 mg/dL (ref 0.40–1.20)
GFR: 86.11 mL/min (ref >60.00)
Glucose, Bld: 104 mg/dL — ABNORMAL HIGH (ref 70–99)
Potassium: 4.6 mEq/L (ref 3.5–5.1)
Sodium: 140 mEq/L (ref 135–145)

## 2019-11-20 LAB — HEMOGLOBIN A1C: Hgb A1c MFr Bld: 6 % (ref 4.6–6.5)

## 2019-11-23 ENCOUNTER — Encounter: Payer: Self-pay | Admitting: Internal Medicine

## 2019-11-23 ENCOUNTER — Telehealth (INDEPENDENT_AMBULATORY_CARE_PROVIDER_SITE_OTHER): Payer: Medicare Other | Admitting: Internal Medicine

## 2019-11-23 DIAGNOSIS — E7800 Pure hypercholesterolemia, unspecified: Secondary | ICD-10-CM

## 2019-11-23 DIAGNOSIS — E041 Nontoxic single thyroid nodule: Secondary | ICD-10-CM

## 2019-11-23 DIAGNOSIS — N898 Other specified noninflammatory disorders of vagina: Secondary | ICD-10-CM

## 2019-11-23 DIAGNOSIS — G8929 Other chronic pain: Secondary | ICD-10-CM

## 2019-11-23 DIAGNOSIS — M5442 Lumbago with sciatica, left side: Secondary | ICD-10-CM | POA: Diagnosis not present

## 2019-11-23 DIAGNOSIS — I10 Essential (primary) hypertension: Secondary | ICD-10-CM

## 2019-11-23 DIAGNOSIS — M546 Pain in thoracic spine: Secondary | ICD-10-CM

## 2019-11-23 DIAGNOSIS — K219 Gastro-esophageal reflux disease without esophagitis: Secondary | ICD-10-CM

## 2019-11-23 DIAGNOSIS — D649 Anemia, unspecified: Secondary | ICD-10-CM | POA: Diagnosis not present

## 2019-11-23 DIAGNOSIS — E119 Type 2 diabetes mellitus without complications: Secondary | ICD-10-CM | POA: Diagnosis not present

## 2019-11-23 DIAGNOSIS — F439 Reaction to severe stress, unspecified: Secondary | ICD-10-CM

## 2019-11-23 DIAGNOSIS — R911 Solitary pulmonary nodule: Secondary | ICD-10-CM

## 2019-11-23 MED ORDER — ACYCLOVIR 400 MG PO TABS: ORAL_TABLET | ORAL | 3 refills | Status: DC

## 2019-11-23 NOTE — Progress Notes (Signed)
Virtual Visit via video Note  This visit type was conducted due to national recommendations for restrictions regarding the COVID-19 pandemic (e.g. social distancing).  This format is felt to be most appropriate for this patient at this time.  All issues noted in this document were discussed and addressed.  No physical exam was performed (except for noted visual exam findings with Video Visits).   I connected with Sherry Simon by a video enabled telemedicine application and verified that I am speaking with the correct person using two identifiers. Location patient: home Location provider: work Persons participating in the virtual visit: patient, provider  The limitations, risks, security and privacy concerns of performing an evaluation and management service by video and the availability of in person appointments have been discussed. The patient expressed understanding and agreed to proceed.   Reason for visit: scheduled follow up.   HPI: Increased stress.  Husband has been in the hospital.  Home now.  Doing better.  Seeing Dr Claudia Desanctis ([facility]) - for continued management of back pain.  Has f/u appt this week.  Saw Dr Wendall Stade - 10/2019.  Conservative treatment.  Tries to stay active  No chest pain reported.  Breathing stable.  No abdominal pain.  Does report some persistent burning with urination.  Worse at night.  No vaginal discharge.  Evaluated here.  Treated for yeast. Nystatin worsened symptoms at first.  Then may have helped some, but persistent.  No urine infection.  Culture negative. Given persistent symptoms, discussed gyn referral.  Request refill for acyclovir.  Describes this is different symptoms then her previous flares. Due f/u chest ct and mammogram 02/2020.    ROS: See pertinent positives and negatives per HPI.  Past Medical History:  Diagnosis Date  . Anxiety and depression   . Colon polyps   . Depression   . Diabetes mellitus ([place])   . Endometriosis    requiring  hysterectomy  . History of chicken pox   . Hypercholesterolemia   . Hypertension   . Nephrolithiasis   . Pericarditis    recurrent, unkown origin  . Tachycardia     Past Surgical History:  Procedure Laterality Date  . ABDOMINAL HYSTERECTOMY  1980  . APPENDECTOMY  1055  . BACK SURGERY  1005-2010   laminectomy  . CHOLECYSTECTOMY     open  . OOPHORECTOMY  1982  . TONSILLECTOMY      Family History  Problem Relation Age of Onset  . Heart disease Father        myocardial infarction - died 4  . Thyroid disease Mother   . Transient ischemic attack Mother        multiple  . Breast cancer Mother   . Hyperlipidemia Mother   . Kidney disease Mother   . Diabetes Mother   . Rheumatic fever Sister        mitral valve problems  . Breast cancer Paternal Aunt   . Other Sister        Small vessel disease  . Colon cancer Neg Hx     SOCIAL HX: reviewed.     Current Outpatient Medications:  .  acyclovir (ZOVIRAX) 400 MG tablet, TAKE 1 TABLET BY MOUTH  DAILY, Disp: 90 tablet, Rfl: 3 .  aspirin 81 MG tablet, Take 81 mg by mouth daily., Disp: , Rfl:  .  atorvastatin (LIPITOR) 40 MG tablet, TAKE 1 TABLET BY MOUTH  DAILY, Disp: 90 tablet, Rfl: 3 .  Calcium Carbonate-Vitamin D 600-400 MG-UNIT tablet, Take  by mouth., Disp: , Rfl:  .  cefdinir (OMNICEF) 300 MG capsule, Take 1 capsule (300 mg total) by mouth 2 (two) times daily., Disp: 10 capsule, Rfl: 0 .  citalopram (CELEXA) 40 MG tablet, TAKE 1 TABLET BY MOUTH  DAILY, Disp: 90 tablet, Rfl: 3 .  ferrous sulfate 325 (65 FE) MG EC tablet, Take 325 mg by mouth daily with breakfast. , Disp: , Rfl:  .  gabapentin (NEURONTIN) 600 MG tablet, Take 600 mg by mouth 2 (two) times daily. , Disp: , Rfl:  .  glucose blood (ONE TOUCH ULTRA TEST) test strip, TEST BLOOD SUGAR TWICE A DAY, Disp: 200 each, Rfl: PRN .  HYDROcodone-acetaminophen (NORCO) 10-325 MG tablet, TAKE 1 TABLET BY MOUTH THREE TIMES DAILY AS NEEDED FOR CHRONIC PAIN, Disp: , Rfl: 0 .   latanoprost (XALATAN) 0.005 % ophthalmic solution, INSTILL 1 DROP INTO THE  LEFT EYE AT BEDTIME  (REFRIGERATE UNTIL FIRST  OPENED FOR USE) (Patient taking differently: Place 1 drop into both eyes. ), Disp: 7.5 mL, Rfl: 3 .  losartan (COZAAR) 100 MG tablet, TAKE 1 TABLET BY MOUTH  DAILY, Disp: 90 tablet, Rfl: 3 .  metFORMIN (GLUCOPHAGE) 500 MG tablet, TAKE 2 TABLETS BY MOUTH  EVERY MORNING AND TAKE 1  TABLET AT SUPPER, Disp: 270 tablet, Rfl: 3 .  nystatin cream (MYCOSTATIN), Apply 1 application topically 2 (two) times daily., Disp: 30 g, Rfl: 0 .  ondansetron (ZOFRAN ODT) 4 MG disintegrating tablet, Take 1 tablet (4 mg total) by mouth every 8 (eight) hours as needed for nausea or vomiting., Disp: 15 tablet, Rfl: 0 .  pantoprazole (PROTONIX) 40 MG tablet, TAKE 1 TABLET BY MOUTH  TWICE DAILY, Disp: 180 tablet, Rfl: 3  EXAM:  GENERAL: alert, oriented, appears well and in no acute distress  HEENT: atraumatic, conjunttiva clear, no obvious abnormalities on inspection of external nose and ears  NECK: normal movements of the head and neck  LUNGS: on inspection no signs of respiratory distress, breathing rate appears normal, no obvious gross SOB, gasping or wheezing  CV: no obvious cyanosis  PSYCH/NEURO: pleasant and cooperative, no obvious depression or anxiety, speech and thought processing grossly intact  ASSESSMENT AND PLAN:  Discussed the following assessment and plan:  Anemia Colonoscopy 06/2017.  Recommended f/u in 5 years.  Follow cbc.   Back pain Chronic back pain.  Has f/u this week with Dr Claudia Desanctis.  Continues on hydrocodone.    Chronic back pain S/p back surgery.  Followed by neurosurgery.    Diabetes mellitus Low carb diet and exercise.  Follow met b and a1c.   GERD (gastroesophageal reflux disease) On protonix.  Symptoms controlled.   Hypercholesterolemia On lipitor.  Low cholesterol diet and exercise.  Follow lipid panel and liver function tests.   Hypertension Blood  pressure under good control.  Continue same medication regimen.  Follow pressures.  Follow metabolic panel.    Lung nodule Being followed by oncology.  Last f/y 02/2019.  Recommended f/u in 6-12 months.  After discussion - states f/u planned 02/2020.   Stress Increased stress as outlined.  Discussed with her today.  Continues on citalopram.  Overall appears to be stable.  Follow.   Thyroid nodule S/p benign biopsy.  Follow tsh.   Vaginal irritation Persistent.  No evidence of urinary tract infection.  Nystatin did not clear.  Given persistent irritation, refer to gyn.    Orders Placed This Encounter  Procedures  . CBC with Differential/Platelet    Standing  Status:   Future    Standing Expiration Date:   11/28/2020  . Vitamin B12    Standing Status:   Future    Standing Expiration Date:   11/28/2020  . IBC + Ferritin    Standing Status:   Future    Standing Expiration Date:   11/28/2020  . Ambulatory referral to Gynecology    Referral Priority:   Routine    Referral Type:   Consultation    Referral Reason:   Specialty Services Required    Requested Specialty:   Gynecology    Number of Visits Requested:   1    Meds ordered this encounter  Medications  . acyclovir (ZOVIRAX) 400 MG tablet    Sig: TAKE 1 TABLET BY MOUTH  DAILY    Dispense:  90 tablet    Refill:  3     I discussed the assessment and treatment plan with the patient. The patient was provided an opportunity to ask questions and all were answered. The patient agreed with the plan and demonstrated an understanding of the instructions.   The patient was advised to call back or seek an in-person evaluation if the symptoms worsen or if the condition fails to improve as anticipated.   Einar Pheasant, MD

## 2019-11-25 DIAGNOSIS — M5416 Radiculopathy, lumbar region: Secondary | ICD-10-CM | POA: Diagnosis not present

## 2019-11-25 DIAGNOSIS — M4722 Other spondylosis with radiculopathy, cervical region: Secondary | ICD-10-CM | POA: Diagnosis not present

## 2019-11-25 DIAGNOSIS — G894 Chronic pain syndrome: Secondary | ICD-10-CM | POA: Diagnosis not present

## 2019-11-29 ENCOUNTER — Encounter: Payer: Self-pay | Admitting: Internal Medicine

## 2019-11-29 NOTE — Assessment & Plan Note (Addendum)
Increased stress as outlined.  Discussed with her today.  Continues on citalopram.  Overall appears to be stable.  Follow.

## 2019-11-29 NOTE — Assessment & Plan Note (Signed)
On lipitor.  Low cholesterol diet and exercise.  Follow lipid panel and liver function tests.   

## 2019-11-29 NOTE — Assessment & Plan Note (Signed)
Low carb diet and exercise.  Follow met b and a1c.  

## 2019-11-29 NOTE — Assessment & Plan Note (Signed)
Being followed by oncology.  Last f/y 02/2019.  Recommended f/u in 6-12 months.  After discussion - states f/u planned 02/2020.

## 2019-11-29 NOTE — Assessment & Plan Note (Signed)
Chronic back pain.  Has f/u this week with Dr Claudia Desanctis.  Continues on hydrocodone.

## 2019-11-29 NOTE — Assessment & Plan Note (Signed)
Colonoscopy 06/2017.  Recommended f/u in 5 years.  Follow cbc.

## 2019-11-29 NOTE — Assessment & Plan Note (Signed)
On protonix.  Symptoms controlled.   

## 2019-11-29 NOTE — Assessment & Plan Note (Signed)
Persistent.  No evidence of urinary tract infection.  Nystatin did not clear.  Given persistent irritation, refer to gyn.

## 2019-11-29 NOTE — Assessment & Plan Note (Signed)
S/p back surgery.  Followed by neurosurgery.  

## 2019-11-29 NOTE — Assessment & Plan Note (Signed)
Blood pressure under good control.  Continue same medication regimen.  Follow pressures.  Follow metabolic panel.   

## 2019-11-29 NOTE — Assessment & Plan Note (Signed)
S/p benign biopsy.  Follow tsh.

## 2019-12-07 ENCOUNTER — Other Ambulatory Visit: Payer: Self-pay

## 2019-12-07 ENCOUNTER — Other Ambulatory Visit (INDEPENDENT_AMBULATORY_CARE_PROVIDER_SITE_OTHER): Payer: Medicare Other

## 2019-12-07 DIAGNOSIS — C50911 Malignant neoplasm of unspecified site of right female breast: Secondary | ICD-10-CM | POA: Diagnosis not present

## 2019-12-07 DIAGNOSIS — D649 Anemia, unspecified: Secondary | ICD-10-CM

## 2019-12-07 DIAGNOSIS — Z170 Estrogen receptor positive status [ER+]: Secondary | ICD-10-CM | POA: Diagnosis not present

## 2019-12-07 LAB — CBC WITH DIFFERENTIAL/PLATELET
Basophils Absolute: 0 10*3/uL (ref 0.0–0.1)
Basophils Relative: 0.6 % (ref 0.0–3.0)
Eosinophils Absolute: 0.1 10*3/uL (ref 0.0–0.7)
Eosinophils Relative: 2.6 % (ref 0.0–5.0)
HCT: 38.1 % (ref 36.0–46.0)
Hemoglobin: 12.7 g/dL (ref 12.0–15.0)
Lymphocytes Relative: 35.2 % (ref 12.0–46.0)
Lymphs Abs: 1.5 10*3/uL (ref 0.7–4.0)
MCHC: 33.3 g/dL (ref 30.0–36.0)
MCV: 95.2 fl (ref 78.0–100.0)
Monocytes Absolute: 0.4 10*3/uL (ref 0.1–1.0)
Monocytes Relative: 8.6 % (ref 3.0–12.0)
Neutro Abs: 2.3 10*3/uL (ref 1.4–7.7)
Neutrophils Relative %: 53 % (ref 43.0–77.0)
Platelets: 295 10*3/uL (ref 150.0–400.0)
RBC: 4.01 Mil/uL (ref 3.87–5.11)
RDW: 12.4 % (ref 11.5–15.5)
WBC: 4.4 10*3/uL (ref 4.0–10.5)

## 2019-12-07 LAB — IBC + FERRITIN
Ferritin: 47.1 ng/mL (ref 10.0–291.0)
Iron: 106 ug/dL (ref 42–145)
Saturation Ratios: 36.4 % (ref 20.0–50.0)
Transferrin: 208 mg/dL — ABNORMAL LOW (ref 212.0–360.0)

## 2019-12-07 LAB — VITAMIN B12: Vitamin B-12: 251 pg/mL (ref 211–911)

## 2019-12-09 ENCOUNTER — Telehealth: Payer: Self-pay | Admitting: Internal Medicine

## 2019-12-09 NOTE — Telephone Encounter (Signed)
See result note.  

## 2019-12-09 NOTE — Telephone Encounter (Signed)
Pt called to get lab results °

## 2019-12-10 DIAGNOSIS — H43813 Vitreous degeneration, bilateral: Secondary | ICD-10-CM | POA: Diagnosis not present

## 2019-12-10 DIAGNOSIS — H3322 Serous retinal detachment, left eye: Secondary | ICD-10-CM | POA: Diagnosis not present

## 2019-12-10 DIAGNOSIS — H35372 Puckering of macula, left eye: Secondary | ICD-10-CM | POA: Diagnosis not present

## 2019-12-10 DIAGNOSIS — E119 Type 2 diabetes mellitus without complications: Secondary | ICD-10-CM | POA: Diagnosis not present

## 2019-12-10 DIAGNOSIS — H35052 Retinal neovascularization, unspecified, left eye: Secondary | ICD-10-CM | POA: Diagnosis not present

## 2019-12-16 ENCOUNTER — Ambulatory Visit: Payer: Medicare Other

## 2019-12-16 ENCOUNTER — Other Ambulatory Visit: Payer: Self-pay

## 2019-12-23 ENCOUNTER — Ambulatory Visit: Payer: Medicare Other

## 2019-12-23 DIAGNOSIS — Z9889 Other specified postprocedural states: Secondary | ICD-10-CM | POA: Diagnosis not present

## 2019-12-23 DIAGNOSIS — M5416 Radiculopathy, lumbar region: Secondary | ICD-10-CM | POA: Diagnosis not present

## 2019-12-23 DIAGNOSIS — M4802 Spinal stenosis, cervical region: Secondary | ICD-10-CM | POA: Diagnosis not present

## 2019-12-23 DIAGNOSIS — G894 Chronic pain syndrome: Secondary | ICD-10-CM | POA: Diagnosis not present

## 2019-12-23 DIAGNOSIS — M4722 Other spondylosis with radiculopathy, cervical region: Secondary | ICD-10-CM | POA: Diagnosis not present

## 2019-12-23 DIAGNOSIS — M5013 Cervical disc disorder with radiculopathy, cervicothoracic region: Secondary | ICD-10-CM | POA: Diagnosis not present

## 2019-12-29 DIAGNOSIS — H6123 Impacted cerumen, bilateral: Secondary | ICD-10-CM | POA: Diagnosis not present

## 2019-12-29 DIAGNOSIS — H903 Sensorineural hearing loss, bilateral: Secondary | ICD-10-CM | POA: Diagnosis not present

## 2019-12-30 ENCOUNTER — Other Ambulatory Visit: Payer: Self-pay

## 2019-12-30 ENCOUNTER — Ambulatory Visit (INDEPENDENT_AMBULATORY_CARE_PROVIDER_SITE_OTHER): Payer: Medicare Other

## 2019-12-30 DIAGNOSIS — E538 Deficiency of other specified B group vitamins: Secondary | ICD-10-CM

## 2019-12-30 MED ORDER — CYANOCOBALAMIN 1000 MCG/ML IJ SOLN
1000.0000 ug | Freq: Once | INTRAMUSCULAR | Status: AC
  Administered 2019-12-30: 1000 ug via INTRAMUSCULAR

## 2019-12-30 NOTE — Progress Notes (Addendum)
Patient presented for B 12 injection to left deltoid, patient voiced no concerns nor showed any signs of distress during injection.  Reviewed.  Dr Scott 

## 2020-01-03 DIAGNOSIS — S3991XA Unspecified injury of abdomen, initial encounter: Secondary | ICD-10-CM | POA: Diagnosis not present

## 2020-01-03 DIAGNOSIS — S3993XA Unspecified injury of pelvis, initial encounter: Secondary | ICD-10-CM | POA: Diagnosis not present

## 2020-01-03 DIAGNOSIS — S299XXA Unspecified injury of thorax, initial encounter: Secondary | ICD-10-CM | POA: Diagnosis not present

## 2020-01-03 DIAGNOSIS — M549 Dorsalgia, unspecified: Secondary | ICD-10-CM | POA: Diagnosis not present

## 2020-01-03 DIAGNOSIS — R911 Solitary pulmonary nodule: Secondary | ICD-10-CM | POA: Diagnosis not present

## 2020-01-03 DIAGNOSIS — S4992XA Unspecified injury of left shoulder and upper arm, initial encounter: Secondary | ICD-10-CM | POA: Diagnosis not present

## 2020-01-03 DIAGNOSIS — M542 Cervicalgia: Secondary | ICD-10-CM | POA: Diagnosis not present

## 2020-01-03 DIAGNOSIS — R079 Chest pain, unspecified: Secondary | ICD-10-CM | POA: Diagnosis not present

## 2020-01-03 DIAGNOSIS — S199XXA Unspecified injury of neck, initial encounter: Secondary | ICD-10-CM | POA: Diagnosis not present

## 2020-01-03 DIAGNOSIS — S3992XA Unspecified injury of lower back, initial encounter: Secondary | ICD-10-CM | POA: Diagnosis not present

## 2020-01-03 DIAGNOSIS — R0789 Other chest pain: Secondary | ICD-10-CM | POA: Diagnosis not present

## 2020-01-03 DIAGNOSIS — M25512 Pain in left shoulder: Secondary | ICD-10-CM | POA: Diagnosis not present

## 2020-01-03 DIAGNOSIS — M79622 Pain in left upper arm: Secondary | ICD-10-CM | POA: Diagnosis not present

## 2020-01-13 DIAGNOSIS — H401122 Primary open-angle glaucoma, left eye, moderate stage: Secondary | ICD-10-CM | POA: Diagnosis not present

## 2020-01-26 ENCOUNTER — Encounter: Payer: Self-pay | Admitting: Internal Medicine

## 2020-01-27 ENCOUNTER — Encounter: Payer: Self-pay | Admitting: Internal Medicine

## 2020-01-27 NOTE — Telephone Encounter (Signed)
Offered pt appt on Friday. Unable to come. Scheduled with Dawson Bills, NP tomorrow at 4pm.

## 2020-01-27 NOTE — Telephone Encounter (Signed)
LMTCB. May need appt prior to ordering labs.

## 2020-01-28 ENCOUNTER — Ambulatory Visit: Payer: Medicare Other | Admitting: Nurse Practitioner

## 2020-01-28 ENCOUNTER — Other Ambulatory Visit: Payer: Self-pay

## 2020-01-28 VITALS — BP 120/72 | HR 80 | Temp 97.2°F | Wt 159.8 lb

## 2020-01-28 DIAGNOSIS — W57XXXA Bitten or stung by nonvenomous insect and other nonvenomous arthropods, initial encounter: Secondary | ICD-10-CM

## 2020-01-28 DIAGNOSIS — S40262A Insect bite (nonvenomous) of left shoulder, initial encounter: Secondary | ICD-10-CM | POA: Diagnosis not present

## 2020-01-28 MED ORDER — DOXYCYCLINE HYCLATE 100 MG PO TABS: 100.0000 mg | ORAL_TABLET | Freq: Two times a day (BID) | ORAL | 0 refills | Status: DC

## 2020-01-28 NOTE — Progress Notes (Signed)
Established Patient Office Visit  Subjective:  Patient ID: FAWNE Sherry Simon, female    DOB: [birth year]  Age: 75 y.o. MRN: [id number]  CC:  Chief Complaint  Patient presents with  . Acute Visit    tick bite/labs    HPI Sherry Simon presents for concerns about a [place] tick bite and wants to get alpha gal blood test. She has 2 friends who had a  severe reaction to eating meat after a tick bite and she wants to get tested.  She found a [place] tick attached to her left shoulder Tues and it was not engorged, but it was stuck on hard and she had to work to get it off.  She was able to remove the entire tick including the head and she shows it to me. It has a white dot on the back and looks like a [place] tick.  This tick bite can cause  alpha gal which is an allergy to mammal meat.  Patient has eaten cheese since she removed the tick and has had no problems.  She has noted no fevers, but has had a headache since Tuesday.  She typically does not have a headache.  This is a mild achiness about the forehead.  She has noted no rash but the tick site is itchy, a little raised, and a little reddened.  Past Medical History:  Diagnosis Date  . Anxiety and depression   . Colon polyps   . Depression   . Diabetes mellitus ([place])   . Endometriosis    requiring hysterectomy  . History of chicken pox   . Hypercholesterolemia   . Hypertension   . Nephrolithiasis   . Pericarditis    recurrent, unkown origin  . Tachycardia     Past Surgical History:  Procedure Laterality Date  . ABDOMINAL HYSTERECTOMY  1980  . APPENDECTOMY  1055  . BACK SURGERY  1005-2010   laminectomy  . CHOLECYSTECTOMY     open  . OOPHORECTOMY  1982  . TONSILLECTOMY      Family History  Problem Relation Age of Onset  . Heart disease Father        myocardial infarction - died 41  . Thyroid disease Mother   . Transient ischemic attack Mother        multiple  . Breast cancer Mother   . Hyperlipidemia  Mother   . Kidney disease Mother   . Diabetes Mother   . Rheumatic fever Sister        mitral valve problems  . Breast cancer Paternal Aunt   . Other Sister        Small vessel disease  . Colon cancer Neg Hx     Social History   Socioeconomic History  . Marital status: Married    Spouse name: Not on file  . Number of children: 3  . Years of education: Not on file  . Highest education level: Not on file  Occupational History  . Not on file  Tobacco Use  . Smoking status: Never Smoker  . Smokeless tobacco: Never Used  Substance and Sexual Activity  . Alcohol use: No    Alcohol/week: 0.0 standard drinks  . Drug use: No  . Sexual activity: Never  Other Topics Concern  . Not on file  Social History Narrative  . Not on file   Social Determinants of Health   Financial Resource Strain: Low Risk   . Difficulty of Paying Living Expenses: Not  hard at all  Food Insecurity: No Food Insecurity  . Worried About Charity fundraiser in the Last Year: Never true  . Ran Out of Food in the Last Year: Never true  Transportation Needs: No Transportation Needs  . Lack of Transportation (Medical): No  . Lack of Transportation (Non-Medical): No  Physical Activity: Unknown  . Days of Exercise per Week: 0 days  . Minutes of Exercise per Session: Not on file  Stress: No Stress Concern Present  . Feeling of Stress : Not at all  Social Connections:   . Frequency of Communication with Friends and Family:   . Frequency of Social Gatherings with Friends and Family:   . Attends Religious Services:   . Active Member of Clubs or Organizations:   . Attends Archivist Meetings:   Marland Kitchen Marital Status:   Intimate Partner Violence: Not At Risk  . Fear of Current or Ex-Partner: No  . Emotionally Abused: No  . Physically Abused: No  . Sexually Abused: No    Outpatient Medications Prior to Visit  Medication Sig Dispense Refill  . acyclovir (ZOVIRAX) 400 MG tablet TAKE 1 TABLET BY MOUTH   DAILY 90 tablet 3  . aspirin 81 MG EC tablet Take by mouth.    Marland Kitchen aspirin 81 MG tablet Take 81 mg by mouth daily.    Marland Kitchen atorvastatin (LIPITOR) 40 MG tablet TAKE 1 TABLET BY MOUTH  DAILY 90 tablet 3  . Calcium Carbonate-Vitamin D 600-400 MG-UNIT tablet Take by mouth.    . Calcium Carbonate-Vitamin D 600-400 MG-UNIT tablet Take by mouth.    . citalopram (CELEXA) 40 MG tablet TAKE 1 TABLET BY MOUTH  DAILY 90 tablet 3  . clobetasol ointment (TEMOVATE) 0.05 % Apply pea size amount daily x 4 weeks, then every other day x 4 weeks, then 2-3 times weekly for maintenance    . diclofenac (FLECTOR) 1.3 % PTCH Flector 1.3 % transdermal 12 hour patch  Apply 1 patch twice a day by transdermal route as needed.    . ferrous sulfate 325 (65 FE) MG EC tablet Take 325 mg by mouth daily with breakfast.     . gabapentin (NEURONTIN) 600 MG tablet Take 600 mg by mouth 2 (two) times daily.     Marland Kitchen glucose blood (ONE TOUCH ULTRA TEST) test strip TEST BLOOD SUGAR TWICE A DAY 200 each PRN  . HYDROcodone-acetaminophen (NORCO) 10-325 MG tablet TAKE 1 TABLET BY MOUTH THREE TIMES DAILY AS NEEDED FOR CHRONIC PAIN  0  . latanoprost (XALATAN) 0.005 % ophthalmic solution INSTILL 1 DROP INTO THE  LEFT EYE AT BEDTIME  (REFRIGERATE UNTIL FIRST  OPENED FOR USE) (Patient taking differently: Place 1 drop into both eyes. ) 7.5 mL 3  . Melatonin 5 MG CAPS Take by mouth.    . metFORMIN (GLUCOPHAGE) 500 MG tablet TAKE 2 TABLETS BY MOUTH  EVERY MORNING AND TAKE 1  TABLET AT SUPPER 270 tablet 3  . metroNIDAZOLE (FLAGYL) 500 MG tablet Take 500 mg by mouth 2 (two) times daily.    Marland Kitchen nystatin cream (MYCOSTATIN) Apply 1 application topically 2 (two) times daily. 30 g 0  . pantoprazole (PROTONIX) 40 MG tablet TAKE 1 TABLET BY MOUTH  TWICE DAILY 180 tablet 3  . timolol (TIMOPTIC) 0.5 % ophthalmic solution Apply to eye.    . cefdinir (OMNICEF) 300 MG capsule Take 1 capsule (300 mg total) by mouth 2 (two) times daily. 10 capsule 0  . losartan (COZAAR)  100  MG tablet TAKE 1 TABLET BY MOUTH  DAILY 90 tablet 3  . ondansetron (ZOFRAN ODT) 4 MG disintegrating tablet Take 1 tablet (4 mg total) by mouth every 8 (eight) hours as needed for nausea or vomiting. 15 tablet 0   No facility-administered medications prior to visit.    Allergies  Allergen Reactions  . Dorzolamide Itching    (Azopt)  . Ivp Dye [Iodinated Diagnostic Agents]     ROS Pertinent positives noted in history of present illness otherwise negative.     Objective:    Physical Exam  Constitutional: She appears well-developed and well-nourished.  HENT:  Head: Normocephalic and atraumatic.  Eyes: Pupils are equal, round, and reactive to light. Conjunctivae are normal.  Cardiovascular: Normal rate and regular rhythm.  Pulmonary/Chest: Effort normal and breath sounds normal.  Musculoskeletal:     Cervical back: Normal range of motion and neck supple.  Skin: Skin is warm and dry.  Tick bite site left shoulder  Vitals reviewed.   There were no vitals taken for this visit. Wt Readings from Last 3 Encounters:  11/23/19 157 lb (71.2 kg)  09/03/19 155 lb (70.3 kg)  07/24/19 161 lb 12.8 oz (73.4 kg)     Health Maintenance Due  Topic Date Due  . TETANUS/TDAP  08/31/2017  . FOOT EXAM  06/04/2018    There are no preventive care reminders to display for this patient.  Lab Results  Component Value Date   TSH 1.52 06/12/2019   Lab Results  Component Value Date   WBC 4.4 12/07/2019   HGB 12.7 12/07/2019   HCT 38.1 12/07/2019   MCV 95.2 12/07/2019   PLT 295.0 12/07/2019   Lab Results  Component Value Date   NA 140 11/19/2019   K 4.6 11/19/2019   CO2 29 11/19/2019   GLUCOSE 104 (H) 11/19/2019   BUN 11 11/19/2019   CREATININE 0.67 11/19/2019   BILITOT 1.3 (H) 11/19/2019   ALKPHOS 56 11/19/2019   AST 12 11/19/2019   ALT 14 11/19/2019   PROT 6.6 11/19/2019   ALBUMIN 4.2 11/19/2019   CALCIUM 9.6 11/19/2019   ANIONGAP 8 07/24/2017   GFR 86.11 11/19/2019    Lab Results  Component Value Date   CHOL 136 11/19/2019   Lab Results  Component Value Date   HDL 46.30 11/19/2019   Lab Results  Component Value Date   LDLCALC 73 11/19/2019   Lab Results  Component Value Date   TRIG 82.0 11/19/2019   Lab Results  Component Value Date   CHOLHDL 3 11/19/2019   Lab Results  Component Value Date   HGBA1C 6.0 11/19/2019      Assessment & Plan:   Problem List Items Addressed This Visit    None    Visit Diagnoses    Tick bite of left shoulder, initial encounter    -  Primary   Relevant Orders   Alpha-Gal Panel      Meds ordered this encounter  Medications  . doxycycline (VIBRA-TABS) 100 MG tablet    Sig: Take 1 tablet (100 mg total) by mouth 2 (two) times daily.    Dispense:  14 tablet    Refill:  0    Order Specific Question:   Supervising Provider    Answer:   Einar Pheasant [id number]   She is advised to keep the tick bite site clean, may apply Neosporin, for itchiness may apply over-the-counter cortisone.  Monitor the site for development of rash.  Call us if she  develops a rash.  Monitor for fevers, worsening headache.  I decided to treat her with doxycycline because she is reporting a mild headache that is not typical for her.  Patient has probiotics at home to use.   Follow-up: Return if symptoms worsen or fail to improve.   This visit occurred during the SARS-CoV-2 public health emergency.  Safety protocols were in place, including screening questions prior to the visit, additional usage of staff PPE, and extensive cleaning of exam room while observing appropriate contact time as indicated for disinfecting solutions.   Denice Paradise, NP

## 2020-01-28 NOTE — Patient Instructions (Addendum)
Let's follow these tick bites with close observation. Information below on Lyme disease and Tick bites in general.   If there is no improvement in your symptoms, or if there is any worsening of symptoms, or if you have any additional concerns, please return for re-evaluation; or, if we are closed, consider going to the Emergency Room for evaluation if symptoms urgent.  Patient education: Lyme disease (The Basics)[place] [facility] [place]  Written by the doctors and editors at [employer]  What is Lyme disease? -- Lyme disease is an illness that can make you feel like you have the flu. It can also cause a rash, fever, or nerve, joint, or heart problems. People can get Lyme disease after being bitten by a tiny insect called a tick. When a certain type of tick bites you, it can transmit the germ that causes Lyme disease from its body to yours. But a tick can infect you only if it stays attached for at least a day. The ticks that carry Lyme disease feed on deer and mice. Ticks are found in tall grass and on shrubs, and can attach to animals and people walking by. Ticks cannot fly or jump. What are the symptoms of Lyme disease? -- Symptoms can start days or weeks after a tick bite. They include: ?A rash where you were bitten - The rash often appears within a month of getting bitten. It is red, but its center can be the color of your skin. It might get bigger over a few days. To some, it looks like a "bull's eye" (picture 1). ?Fever ?Feeling tired ?Body aches and pains ?Heart problems such as a slowed heart rate ?Headache and stiff neck ?Feelings of pain, weakness, or numbness If a person is not treated, further symptoms can occur months to years after a tick bite. These include: ?Pain and swelling of joints, such as your knees ?Trouble with your memory and thinking ?Skin problems, such as skin swelling or thinning (this occurs mostly in [place]) Is there a test for Lyme disease? -- Yes. Blood tests can show if you  are infected with the germ that causes Lyme disease. But, it takes time for the blood tests to turn positive. This means the tests won't work if you get them right after being bitten. Also, sometimes the blood tests come back negative even when you have the rash that goes with Lyme disease. Because of this, if you have the rash, the blood test is not needed to confirm that you have Lyme disease. If your doctor or nurse suspects you have Lyme disease, he or she will do an exam and ask you questions. The doctor or nurse will use this information (and your blood test result, if needed) to decide about treatment. What should I do if I get bitten by a tick or if my child gets bitten? -- If you find a tick on your body or on your child, use tweezers to grab it. Then pull it out slowly and gently. After that, wash the area with soap and water. You do not need to keep the tick. But knowing what it looked like can help your doctor decide about your treatment. See if you can tell: ?Its color and size ?If it was attached to your skin or just resting on your skin ?If it was big, round, and full of blood (picture 2) You should watch the area around the bite for a month to see if a rash occurs. Should I see a doctor  or nurse? -- See your doctor or nurse if you have a tick and you cannot get it off or if you think you have had a tick attached for at least 36 hours (a day and a half). You should also see a doctor or nurse if you develop symptoms of Lyme disease. Some people don't know that they were bitten by a tick. Or they might not remember having a rash or early symptoms of Lyme disease. How is Lyme disease treated? -- Lyme disease is usually treated with antibiotics. Treatment with antibiotics should help your symptoms go away. Sometimes, symptoms improve quickly. Other times, it can take weeks or months for symptoms to go away. Your doctor might prescribe medicine for you to take right after a tick bite. Or your  doctor might wait to see if you first develop symptoms. Either way, the medicine will treat your Lyme disease. What can I do to try to avoid getting bitten by a tick? -- You can: ?Wear shoes, long-sleeved shirts, and long pants when you go outside. Keep ticks away from your skin by tucking your pants into your socks. ?Wear light colors so you can spot any ticks that get on your clothes ?Use bug sprays to keep ticks off your skin or clothes ?Shower within 2 hours of being outdoors if you think you have been in an area where there are ticks ?Check your clothes and body for ticks after being outdoors. Be sure to check your scalp, waist, armpits, groin, and backs of your knees. Check your children, too. ?If you live in a place that has deer or mice nearby, take steps to keep those animals away. Deer and mice carry ticks.   Tick Bite Information Ticks are insects that attach themselves to the skin and draw blood for food. There are various types of ticks. Common types include wood ticks and deer ticks. Most ticks live in shrubs and grassy areas. Ticks can climb onto your body when you make contact with leaves or grass where the tick is waiting. The most common places on the body for ticks to attach themselves are the scalp, neck, armpits, waist, and groin. Most tick bites are harmless, but sometimes ticks carry germs that cause diseases. These germs can be spread to a person during the tick's feeding process. The chance of a disease spreading through a tick bite depends on:   The type of tick.  Time of year.    How long the tick is attached.    Geographic location.   HOW CAN YOU PREVENT TICK BITES? Take these steps to help prevent tick bites when you are outdoors:  Wear protective clothing. Long sleeves and long pants are best.    Wear white clothes so you can see ticks more easily.  Tuck your pant legs into your socks.    If walking on a trail, stay in the middle of the trail to avoid  brushing against bushes.  Avoid walking through areas with long grass.   Put insect repellent on all exposed skin and along boot tops, pant legs, and sleeve cuffs.    Check clothing, hair, and skin repeatedly and before going inside.    Brush off any ticks that are not attached.  Take a shower or bath as soon as possible after being outdoors.    WHAT IS THE PROPER WAY TO REMOVE A TICK? Ticks should be removed as soon as possible to help prevent diseases caused by tick bites. 1. If latex  gloves are available, put them on before trying to remove a tick.   2. Using fine-point tweezers, grasp the tick as close to the skin as possible. You may also use curved forceps or a tick removal tool. Grasp the tick as close to its head as possible. Avoid grasping the tick on its body. 3. Pull gently with steady upward pressure until the tick lets go. Do not twist the tick or jerk it suddenly. This may break off the tick's head or mouth parts. 4. Do not squeeze or crush the tick's body. This could force disease-carrying fluids from the tick into your body.   5. After the tick is removed, wash the bite area and your hands with soap and water or other disinfectant such as alcohol. 6. Apply a small amount of antiseptic cream or ointment to the bite site.   7. Wash and disinfect any instruments that were used.   Do not try to remove a tick by applying a hot match, petroleum jelly, or fingernail polish to the tick. These methods do not work and may increase the chances of disease being spread from the tick bite.  WHEN SHOULD YOU SEEK MEDICAL CARE? Contact your health care provider if you are unable to remove a tick from your skin or if a part of the tick breaks off and is stuck in the skin.  After a tick bite, you need to be aware of signs and symptoms that could be related to diseases spread by ticks. Contact your health care provider if you develop any of the following in the days or weeks after the tick  bite:  Unexplained fever.  Rash. A circular rash that appears days or weeks after the tick bite may indicate the possibility of Lyme disease. The rash may resemble a target with a bull's-eye and may occur at a different part of your body than the tick bite.  Redness and swelling in the area of the tick bite.    Tender, swollen lymph glands.    Diarrhea.    Weight loss.    Cough.    Fatigue.    Muscle, joint, or bone pain.    Abdominal pain.    Headache.    Lethargy or a change in your level of consciousness.  Difficulty walking or moving your legs.    Numbness in the legs.    Paralysis.  Shortness of breath.    Confusion.    Repeated vomiting.     This information is not intended to replace advice given to you by your health care provider. Make sure you discuss any questions you have with your health care provider.   Document Released: 09/07/2000 Document Revised: 10/01/2014 Document Reviewed: 02/18/2013 Elsevier Interactive Patient Education [employer].

## 2020-01-29 ENCOUNTER — Encounter: Payer: Self-pay | Admitting: Nurse Practitioner

## 2020-02-02 ENCOUNTER — Ambulatory Visit: Payer: Medicare Other

## 2020-02-02 LAB — ALPHA-GAL PANEL
Beef IgE: <0.1 kU/L (ref <0.35)
Class: 0
Class: 0
Class: 0
Galactose-alpha-1,3-galactose IgE: <0.1 kU/L (ref <0.10)
LAMB/MUTTON IGE: <0.1 kU/L (ref <0.35)
Pork IgE: <0.1 kU/L (ref <0.35)

## 2020-02-10 DIAGNOSIS — L900 Lichen sclerosus et atrophicus: Secondary | ICD-10-CM | POA: Diagnosis not present

## 2020-02-17 ENCOUNTER — Ambulatory Visit: Payer: Medicare Other | Admitting: *Deleted

## 2020-02-17 ENCOUNTER — Other Ambulatory Visit: Payer: Self-pay

## 2020-02-24 DIAGNOSIS — M4722 Other spondylosis with radiculopathy, cervical region: Secondary | ICD-10-CM | POA: Diagnosis not present

## 2020-02-24 DIAGNOSIS — G894 Chronic pain syndrome: Secondary | ICD-10-CM | POA: Diagnosis not present

## 2020-02-24 DIAGNOSIS — M5416 Radiculopathy, lumbar region: Secondary | ICD-10-CM | POA: Diagnosis not present

## 2020-02-25 DIAGNOSIS — R911 Solitary pulmonary nodule: Secondary | ICD-10-CM | POA: Diagnosis not present

## 2020-02-25 DIAGNOSIS — R918 Other nonspecific abnormal finding of lung field: Secondary | ICD-10-CM | POA: Diagnosis not present

## 2020-02-25 DIAGNOSIS — N6489 Other specified disorders of breast: Secondary | ICD-10-CM | POA: Diagnosis not present

## 2020-02-25 DIAGNOSIS — Z1231 Encounter for screening mammogram for malignant neoplasm of breast: Secondary | ICD-10-CM | POA: Diagnosis not present

## 2020-03-01 DIAGNOSIS — R5383 Other fatigue: Secondary | ICD-10-CM | POA: Diagnosis not present

## 2020-03-01 DIAGNOSIS — C50911 Malignant neoplasm of unspecified site of right female breast: Secondary | ICD-10-CM | POA: Diagnosis not present

## 2020-03-01 DIAGNOSIS — Z1231 Encounter for screening mammogram for malignant neoplasm of breast: Secondary | ICD-10-CM | POA: Diagnosis not present

## 2020-03-01 DIAGNOSIS — R918 Other nonspecific abnormal finding of lung field: Secondary | ICD-10-CM | POA: Diagnosis not present

## 2020-03-01 DIAGNOSIS — Z170 Estrogen receptor positive status [ER+]: Secondary | ICD-10-CM | POA: Diagnosis not present

## 2020-03-01 DIAGNOSIS — R911 Solitary pulmonary nodule: Secondary | ICD-10-CM | POA: Diagnosis not present

## 2020-03-23 ENCOUNTER — Ambulatory Visit (INDEPENDENT_AMBULATORY_CARE_PROVIDER_SITE_OTHER): Payer: Medicare Other

## 2020-03-23 ENCOUNTER — Other Ambulatory Visit: Payer: Self-pay

## 2020-03-23 DIAGNOSIS — E538 Deficiency of other specified B group vitamins: Secondary | ICD-10-CM

## 2020-03-23 MED ORDER — CYANOCOBALAMIN 1000 MCG/ML IJ SOLN
1000.0000 ug | Freq: Once | INTRAMUSCULAR | Status: AC
  Administered 2020-03-23: 1000 ug via INTRAMUSCULAR

## 2020-03-23 NOTE — Progress Notes (Addendum)
Patient in today for monthly b12 injection. Given in L deltoid. Tolerated well. No complaints or concerns.   Reviewed.  Dr Nicki Reaper

## 2020-03-25 ENCOUNTER — Other Ambulatory Visit: Payer: Self-pay | Admitting: Internal Medicine

## 2020-03-29 ENCOUNTER — Other Ambulatory Visit: Payer: Medicare Other

## 2020-03-31 ENCOUNTER — Ambulatory Visit (INDEPENDENT_AMBULATORY_CARE_PROVIDER_SITE_OTHER): Payer: Medicare Other | Admitting: Internal Medicine

## 2020-03-31 ENCOUNTER — Other Ambulatory Visit: Payer: Self-pay

## 2020-03-31 DIAGNOSIS — Z853 Personal history of malignant neoplasm of breast: Secondary | ICD-10-CM

## 2020-03-31 DIAGNOSIS — F439 Reaction to severe stress, unspecified: Secondary | ICD-10-CM | POA: Diagnosis not present

## 2020-03-31 DIAGNOSIS — R911 Solitary pulmonary nodule: Secondary | ICD-10-CM | POA: Diagnosis not present

## 2020-03-31 DIAGNOSIS — E7800 Pure hypercholesterolemia, unspecified: Secondary | ICD-10-CM | POA: Diagnosis not present

## 2020-03-31 DIAGNOSIS — L900 Lichen sclerosus et atrophicus: Secondary | ICD-10-CM

## 2020-03-31 DIAGNOSIS — E041 Nontoxic single thyroid nodule: Secondary | ICD-10-CM

## 2020-03-31 DIAGNOSIS — E119 Type 2 diabetes mellitus without complications: Secondary | ICD-10-CM

## 2020-03-31 DIAGNOSIS — D649 Anemia, unspecified: Secondary | ICD-10-CM

## 2020-03-31 DIAGNOSIS — K219 Gastro-esophageal reflux disease without esophagitis: Secondary | ICD-10-CM

## 2020-03-31 DIAGNOSIS — M5442 Lumbago with sciatica, left side: Secondary | ICD-10-CM

## 2020-03-31 DIAGNOSIS — G8929 Other chronic pain: Secondary | ICD-10-CM

## 2020-03-31 NOTE — Progress Notes (Signed)
Patient ID: Sherry Simon, female   DOB: 02/15/1946, 74 y.o.   MRN: [id number]   Subjective:    Patient ID: Sherry Simon, female    DOB: 1946/08/02, 74 y.o.   MRN: [id number]  HPI This visit occurred during the SARS-CoV-2 public health emergency.  Safety protocols were in place, including screening questions prior to the visit, additional usage of staff PPE, and extensive cleaning of exam room while observing appropriate contact time as indicated for disinfecting solutions.  Patient here for a scheduled follow up.  Increased fatigue.  Increased back pain - affects sleeping.  Taking melatonin.  Some neck issues.  Numbness - left > right.  No headache or dizziness.  No chest pain.  Breathing stable.  No increased cough or congestion.  No abdominal pain.  Bowels moving.  Saw oncology 03/01/20 - stable CT chest since 04/2016.  Screening mammogram - ok. Also seeing gyn for f/u lichen sclerosus.  Increased stress with family medical issues.  Discussed with her today.  Does not feel needs any further intervention at this time.    Past Medical History:  Diagnosis Date  . Anxiety and depression   . Colon polyps   . Depression   . Diabetes mellitus ([place])   . Endometriosis    requiring hysterectomy  . History of chicken pox   . Hypercholesterolemia   . Hypertension   . Nephrolithiasis   . Pericarditis    recurrent, unkown origin  . Tachycardia    Past Surgical History:  Procedure Laterality Date  . ABDOMINAL HYSTERECTOMY  1980  . APPENDECTOMY  1055  . BACK SURGERY  1005-2010   laminectomy  . CHOLECYSTECTOMY     open  . OOPHORECTOMY  1982  . TONSILLECTOMY     Family History  Problem Relation Age of Onset  . Heart disease Father        myocardial infarction - died 39  . Thyroid disease Mother   . Transient ischemic attack Mother        multiple  . Breast cancer Mother   . Hyperlipidemia Mother   . Kidney disease Mother   . Diabetes Mother   . Rheumatic fever Sister         mitral valve problems  . Breast cancer Paternal Aunt   . Other Sister        Small vessel disease  . Colon cancer Neg Hx    Social History   Socioeconomic History  . Marital status: Married    Spouse name: Not on file  . Number of children: 3  . Years of education: Not on file  . Highest education level: Not on file  Occupational History  . Not on file  Tobacco Use  . Smoking status: Never Smoker  . Smokeless tobacco: Never Used  Vaping Use  . Vaping Use: Never used  Substance and Sexual Activity  . Alcohol use: No    Alcohol/week: 0.0 standard drinks  . Drug use: No  . Sexual activity: Never  Other Topics Concern  . Not on file  Social History Narrative  . Not on file   Social Determinants of Health   Financial Resource Strain: Low Risk   . Difficulty of Paying Living Expenses: Not hard at all  Food Insecurity: No Food Insecurity  . Worried About Charity fundraiser in the Last Year: Never true  . Ran Out of Food in the Last Year: Never true  Transportation Needs: No Transportation Needs  .  Lack of Transportation (Medical): No  . Lack of Transportation (Non-Medical): No  Physical Activity: Unknown  . Days of Exercise per Week: 0 days  . Minutes of Exercise per Session: Not on file  Stress: No Stress Concern Present  . Feeling of Stress : Not at all  Social Connections:   . Frequency of Communication with Friends and Family:   . Frequency of Social Gatherings with Friends and Family:   . Attends Religious Services:   . Active Member of Clubs or Organizations:   . Attends Archivist Meetings:   Marland Kitchen Marital Status:     Outpatient Encounter Medications as of 03/31/2020  Medication Sig  . acyclovir (ZOVIRAX) 400 MG tablet TAKE 1 TABLET BY MOUTH  DAILY  . aspirin 81 MG EC tablet Take by mouth.  Marland Kitchen atorvastatin (LIPITOR) 40 MG tablet TAKE 1 TABLET BY MOUTH  DAILY  . Calcium Carbonate-Vitamin D 600-400 MG-UNIT tablet Take by mouth.  . citalopram (CELEXA) 40  MG tablet TAKE 1 TABLET BY MOUTH  DAILY  . clobetasol ointment (TEMOVATE) 0.05 % Apply pea size amount daily x 4 weeks, then every other day x 4 weeks, then 2-3 times weekly for maintenance  . diclofenac (FLECTOR) 1.3 % PTCH Flector 1.3 % transdermal 12 hour patch  Apply 1 patch twice a day by transdermal route as needed.  . ferrous sulfate 325 (65 FE) MG EC tablet Take 325 mg by mouth daily with breakfast.   . gabapentin (NEURONTIN) 600 MG tablet Take 600 mg by mouth 2 (two) times daily.   Marland Kitchen glucose blood (ONE TOUCH ULTRA TEST) test strip TEST BLOOD SUGAR TWICE A DAY  . HYDROcodone-acetaminophen (NORCO) 10-325 MG tablet TAKE 1 TABLET BY MOUTH THREE TIMES DAILY AS NEEDED FOR CHRONIC PAIN  . latanoprost (XALATAN) 0.005 % ophthalmic solution INSTILL 1 DROP INTO THE  LEFT EYE AT BEDTIME  (REFRIGERATE UNTIL FIRST  OPENED FOR USE) (Patient taking differently: Place 1 drop into both eyes. )  . Melatonin 5 MG CAPS Take by mouth.  . metFORMIN (GLUCOPHAGE) 500 MG tablet TAKE 2 TABLETS BY MOUTH IN  THE MORNING AND TAKE 1  TABLET AT SUPPER  . nystatin cream (MYCOSTATIN) Apply 1 application topically 2 (two) times daily.  . pantoprazole (PROTONIX) 40 MG tablet TAKE 1 TABLET BY MOUTH  TWICE DAILY  . timolol (TIMOPTIC) 0.5 % ophthalmic solution Apply to eye.  . [DISCONTINUED] aspirin 81 MG tablet Take 81 mg by mouth daily.  . [DISCONTINUED] Calcium Carbonate-Vitamin D 600-400 MG-UNIT tablet Take by mouth.  . [DISCONTINUED] doxycycline (VIBRA-TABS) 100 MG tablet Take 1 tablet (100 mg total) by mouth 2 (two) times daily.  . [DISCONTINUED] metroNIDAZOLE (FLAGYL) 500 MG tablet Take 500 mg by mouth 2 (two) times daily.   No facility-administered encounter medications on file as of 03/31/2020.    Review of Systems  Constitutional: Positive for fatigue. Negative for appetite change and unexpected weight change.  HENT: Negative for congestion and sinus pressure.   Respiratory: Negative for cough, chest tightness  and shortness of breath.   Cardiovascular: Negative for chest pain, palpitations and leg swelling.  Gastrointestinal: Negative for abdominal pain, diarrhea, nausea and vomiting.  Genitourinary: Negative for difficulty urinating and dysuria.  Musculoskeletal: Positive for back pain and neck pain. Negative for joint swelling and myalgias.  Skin: Negative for color change and rash.  Neurological: Negative for dizziness, light-headedness and headaches.  Psychiatric/Behavioral: Negative for agitation and dysphoric mood.       Objective:  Physical Exam Vitals reviewed.  Constitutional:      General: She is not in acute distress.    Appearance: Normal appearance.  HENT:     Head: Normocephalic and atraumatic.     Right Ear: External ear normal.     Left Ear: External ear normal.  Eyes:     General: No scleral icterus.       Right eye: No discharge.        Left eye: No discharge.     Conjunctiva/sclera: Conjunctivae normal.  Neck:     Thyroid: No thyromegaly.  Cardiovascular:     Rate and Rhythm: Normal rate and regular rhythm.  Pulmonary:     Effort: No respiratory distress.     Breath sounds: Normal breath sounds. No wheezing.  Abdominal:     General: Bowel sounds are normal.     Palpations: Abdomen is soft.     Tenderness: There is no abdominal tenderness.  Musculoskeletal:        General: No swelling or tenderness.     Cervical back: Neck supple. No tenderness.  Lymphadenopathy:     Cervical: No cervical adenopathy.  Skin:    Findings: No erythema or rash.  Neurological:     Mental Status: She is alert.  Psychiatric:        Mood and Affect: Mood normal.        Behavior: Behavior normal.     BP 120/70   Pulse 80   Temp 98.1 F (36.7 C)   Resp 16   Ht '5\' 4"'$  (1.626 m)   Wt 160 lb (72.6 kg)   SpO2 98%   BMI 27.46 kg/m  Wt Readings from Last 3 Encounters:  03/31/20 160 lb (72.6 kg)  01/29/20 159 lb 12.8 oz (72.5 kg)  11/23/19 157 lb (71.2 kg)     Lab  Results  Component Value Date   WBC 4.4 12/07/2019   HGB 12.7 12/07/2019   HCT 38.1 12/07/2019   PLT 295.0 12/07/2019   GLUCOSE 104 (H) 11/19/2019   CHOL 136 11/19/2019   TRIG 82.0 11/19/2019   HDL 46.30 11/19/2019   LDLCALC 73 11/19/2019   ALT 14 11/19/2019   AST 12 11/19/2019   NA 140 11/19/2019   K 4.6 11/19/2019   CL 104 11/19/2019   CREATININE 0.67 11/19/2019   BUN 11 11/19/2019   CO2 29 11/19/2019   TSH 1.52 06/12/2019   HGBA1C 6.0 11/19/2019   MICROALBUR <0.7 06/12/2019       Assessment & Plan:   Problem List Items Addressed This Visit    Anemia    Follow cbc.  Colonoscopy 2018.  Recommended f/u in 5 years.        Chronic back pain    S/p back surgery.  Followed by neurosurgery.       Diabetes mellitus ([place])    Low carb diet and exercise.  Follow met b and a1c. On metformin.        Relevant Orders   Hemoglobin X9K   Basic metabolic panel   GERD (gastroesophageal reflux disease)    Controlled on protonix.       History of breast cancer    Followed by oncology.  Just evaluated.  Mammogram - ok.  Follow.        Hypercholesterolemia    On lipitor.  Low cholesterol diet and exercise.  Follow lipid panel and liver function tests.        Relevant Orders   Hepatic function  panel   Lipid panel   Lichen sclerosus    Sees gyn.        Lung nodule    Being followed by oncology.  Recent CT chest - stable.  Recommended no further f/u scanning.        Stress    Increased stress.  On citalopram.  Discussed with her today.  Does not feel needs any further intervention.  Follow.        Thyroid nodule    S/p previous biopsy.  Follow tsh.           Einar Pheasant, MD

## 2020-04-05 ENCOUNTER — Encounter: Payer: Self-pay | Admitting: Internal Medicine

## 2020-04-05 NOTE — Assessment & Plan Note (Signed)
On lipitor.  Low cholesterol diet and exercise.  Follow lipid panel and liver function tests.   

## 2020-04-05 NOTE — Assessment & Plan Note (Signed)
S/p previous biopsy.  Follow tsh.  

## 2020-04-05 NOTE — Assessment & Plan Note (Signed)
Being followed by oncology.  Recent CT chest - stable.  Recommended no further f/u scanning.

## 2020-04-05 NOTE — Assessment & Plan Note (Signed)
Increased stress.  On citalopram.  Discussed with her today.  Does not feel needs any further intervention.  Follow.

## 2020-04-06 ENCOUNTER — Encounter: Payer: Self-pay | Admitting: Internal Medicine

## 2020-04-06 DIAGNOSIS — M5416 Radiculopathy, lumbar region: Secondary | ICD-10-CM | POA: Diagnosis not present

## 2020-04-06 DIAGNOSIS — M4722 Other spondylosis with radiculopathy, cervical region: Secondary | ICD-10-CM | POA: Diagnosis not present

## 2020-04-06 DIAGNOSIS — L900 Lichen sclerosus et atrophicus: Secondary | ICD-10-CM | POA: Insufficient documentation

## 2020-04-06 DIAGNOSIS — G894 Chronic pain syndrome: Secondary | ICD-10-CM | POA: Diagnosis not present

## 2020-04-06 NOTE — Assessment & Plan Note (Signed)
S/p back surgery.  Followed by neurosurgery.

## 2020-04-06 NOTE — Assessment & Plan Note (Signed)
Follow cbc.  Colonoscopy 2018.  Recommended f/u in 5 years.  °

## 2020-04-06 NOTE — Assessment & Plan Note (Signed)
Low carb diet and exercise.  Follow met b and a1c.  On metformin.   

## 2020-04-06 NOTE — Assessment & Plan Note (Signed)
Sees gyn.

## 2020-04-06 NOTE — Assessment & Plan Note (Signed)
Followed by oncology.  Just evaluated.  Mammogram - ok.  Follow.

## 2020-04-06 NOTE — Assessment & Plan Note (Signed)
Controlled on protonix.   

## 2020-04-21 ENCOUNTER — Other Ambulatory Visit (INDEPENDENT_AMBULATORY_CARE_PROVIDER_SITE_OTHER): Payer: Medicare Other

## 2020-04-21 DIAGNOSIS — E7800 Pure hypercholesterolemia, unspecified: Secondary | ICD-10-CM | POA: Diagnosis not present

## 2020-04-21 DIAGNOSIS — E119 Type 2 diabetes mellitus without complications: Secondary | ICD-10-CM

## 2020-04-21 LAB — BASIC METABOLIC PANEL
BUN: 12 mg/dL (ref 6–23)
CO2: 29 mEq/L (ref 19–32)
Calcium: 9.7 mg/dL (ref 8.4–10.5)
Chloride: 101 mEq/L (ref 96–112)
Creatinine, Ser: 0.67 mg/dL (ref 0.40–1.20)
GFR: 86.01 mL/min (ref >60.00)
Glucose, Bld: 95 mg/dL (ref 70–99)
Potassium: 4.4 mEq/L (ref 3.5–5.1)
Sodium: 136 mEq/L (ref 135–145)

## 2020-04-21 LAB — HEPATIC FUNCTION PANEL
ALT: 14 U/L (ref 0–35)
AST: 13 U/L (ref 0–37)
Albumin: 4.5 g/dL (ref 3.5–5.2)
Alkaline Phosphatase: 62 U/L (ref 39–117)
Bilirubin, Direct: 0.2 mg/dL (ref 0.0–0.3)
Total Bilirubin: 1.6 mg/dL — ABNORMAL HIGH (ref 0.2–1.2)
Total Protein: 6.5 g/dL (ref 6.0–8.3)

## 2020-04-21 LAB — LIPID PANEL
Cholesterol: 148 mg/dL (ref 0–200)
HDL: 51.2 mg/dL (ref >39.00)
LDL Cholesterol: 80 mg/dL (ref 0–99)
NonHDL: 96.87
Total CHOL/HDL Ratio: 3
Triglycerides: 86 mg/dL (ref 0.0–149.0)
VLDL: 17.2 mg/dL (ref 0.0–40.0)

## 2020-04-21 LAB — HEMOGLOBIN A1C: Hgb A1c MFr Bld: 6.2 % (ref 4.6–6.5)

## 2020-04-26 ENCOUNTER — Other Ambulatory Visit: Payer: Self-pay

## 2020-04-26 ENCOUNTER — Ambulatory Visit (INDEPENDENT_AMBULATORY_CARE_PROVIDER_SITE_OTHER): Payer: Medicare Other

## 2020-04-26 DIAGNOSIS — E538 Deficiency of other specified B group vitamins: Secondary | ICD-10-CM

## 2020-04-26 MED ORDER — CYANOCOBALAMIN 1000 MCG/ML IJ SOLN
1000.0000 ug | Freq: Once | INTRAMUSCULAR | Status: AC
  Administered 2020-04-26: 1000 ug via INTRAMUSCULAR

## 2020-04-26 NOTE — Progress Notes (Addendum)
Patient presented for B 12 injection to left deltoid, patient voiced no concerns nor showed any signs of distress during injection.  Reviewed.  Dr Scott 

## 2020-05-04 DIAGNOSIS — M5440 Lumbago with sciatica, unspecified side: Secondary | ICD-10-CM | POA: Diagnosis not present

## 2020-05-04 DIAGNOSIS — M4722 Other spondylosis with radiculopathy, cervical region: Secondary | ICD-10-CM | POA: Diagnosis not present

## 2020-05-04 DIAGNOSIS — G894 Chronic pain syndrome: Secondary | ICD-10-CM | POA: Diagnosis not present

## 2020-05-04 DIAGNOSIS — M5416 Radiculopathy, lumbar region: Secondary | ICD-10-CM | POA: Diagnosis not present

## 2020-05-16 DIAGNOSIS — M9973 Connective tissue and disc stenosis of intervertebral foramina of lumbar region: Secondary | ICD-10-CM | POA: Diagnosis not present

## 2020-05-16 DIAGNOSIS — M5116 Intervertebral disc disorders with radiculopathy, lumbar region: Secondary | ICD-10-CM | POA: Diagnosis not present

## 2020-05-16 DIAGNOSIS — M5440 Lumbago with sciatica, unspecified side: Secondary | ICD-10-CM | POA: Diagnosis not present

## 2020-05-31 ENCOUNTER — Ambulatory Visit (INDEPENDENT_AMBULATORY_CARE_PROVIDER_SITE_OTHER): Payer: Medicare Other

## 2020-05-31 ENCOUNTER — Other Ambulatory Visit: Payer: Self-pay

## 2020-05-31 ENCOUNTER — Ambulatory Visit: Payer: Medicare Other

## 2020-05-31 DIAGNOSIS — E538 Deficiency of other specified B group vitamins: Secondary | ICD-10-CM | POA: Diagnosis not present

## 2020-05-31 MED ORDER — CYANOCOBALAMIN 1000 MCG/ML IJ SOLN
1000.0000 ug | Freq: Once | INTRAMUSCULAR | Status: AC
  Administered 2020-05-31: 1000 ug via INTRAMUSCULAR

## 2020-05-31 NOTE — Progress Notes (Addendum)
Patient presented for B 12 injection to left deltoid, patient voiced no concerns nor showed any signs of distress during injection.  Reviewed.  Dr Scott 

## 2020-06-01 DIAGNOSIS — M5416 Radiculopathy, lumbar region: Secondary | ICD-10-CM | POA: Diagnosis not present

## 2020-06-01 DIAGNOSIS — G894 Chronic pain syndrome: Secondary | ICD-10-CM | POA: Diagnosis not present

## 2020-06-01 DIAGNOSIS — M4722 Other spondylosis with radiculopathy, cervical region: Secondary | ICD-10-CM | POA: Diagnosis not present

## 2020-06-01 DIAGNOSIS — M47816 Spondylosis without myelopathy or radiculopathy, lumbar region: Secondary | ICD-10-CM | POA: Diagnosis not present

## 2020-06-15 DIAGNOSIS — H401122 Primary open-angle glaucoma, left eye, moderate stage: Secondary | ICD-10-CM | POA: Diagnosis not present

## 2020-06-22 ENCOUNTER — Other Ambulatory Visit: Payer: Self-pay | Admitting: Internal Medicine

## 2020-06-23 DIAGNOSIS — H35372 Puckering of macula, left eye: Secondary | ICD-10-CM | POA: Diagnosis not present

## 2020-06-23 DIAGNOSIS — E119 Type 2 diabetes mellitus without complications: Secondary | ICD-10-CM | POA: Diagnosis not present

## 2020-06-23 DIAGNOSIS — H35052 Retinal neovascularization, unspecified, left eye: Secondary | ICD-10-CM | POA: Diagnosis not present

## 2020-06-23 DIAGNOSIS — H3322 Serous retinal detachment, left eye: Secondary | ICD-10-CM | POA: Diagnosis not present

## 2020-06-23 DIAGNOSIS — H43813 Vitreous degeneration, bilateral: Secondary | ICD-10-CM | POA: Diagnosis not present

## 2020-06-29 DIAGNOSIS — M4722 Other spondylosis with radiculopathy, cervical region: Secondary | ICD-10-CM | POA: Diagnosis not present

## 2020-06-29 DIAGNOSIS — G894 Chronic pain syndrome: Secondary | ICD-10-CM | POA: Diagnosis not present

## 2020-06-29 DIAGNOSIS — M5416 Radiculopathy, lumbar region: Secondary | ICD-10-CM | POA: Diagnosis not present

## 2020-06-30 ENCOUNTER — Ambulatory Visit (INDEPENDENT_AMBULATORY_CARE_PROVIDER_SITE_OTHER): Payer: Medicare Other

## 2020-06-30 ENCOUNTER — Other Ambulatory Visit: Payer: Self-pay

## 2020-06-30 DIAGNOSIS — E538 Deficiency of other specified B group vitamins: Secondary | ICD-10-CM

## 2020-06-30 MED ORDER — CYANOCOBALAMIN 1000 MCG/ML IJ SOLN
1000.0000 ug | Freq: Once | INTRAMUSCULAR | Status: AC
  Administered 2020-06-30: 1000 ug via INTRAMUSCULAR

## 2020-06-30 NOTE — Progress Notes (Addendum)
Patient presented for B 12 injection to left deltoid, patient voiced no concerns nor showed any signs of distress during injection.  Reviewed.  Dr Scott 

## 2020-07-06 DIAGNOSIS — M5416 Radiculopathy, lumbar region: Secondary | ICD-10-CM | POA: Diagnosis not present

## 2020-07-06 DIAGNOSIS — M50122 Cervical disc disorder at C5-C6 level with radiculopathy: Secondary | ICD-10-CM | POA: Diagnosis not present

## 2020-07-06 DIAGNOSIS — M4802 Spinal stenosis, cervical region: Secondary | ICD-10-CM | POA: Diagnosis not present

## 2020-07-06 DIAGNOSIS — G894 Chronic pain syndrome: Secondary | ICD-10-CM | POA: Diagnosis not present

## 2020-07-06 DIAGNOSIS — M7918 Myalgia, other site: Secondary | ICD-10-CM | POA: Diagnosis not present

## 2020-07-06 DIAGNOSIS — Z7722 Contact with and (suspected) exposure to environmental tobacco smoke (acute) (chronic): Secondary | ICD-10-CM | POA: Diagnosis not present

## 2020-07-14 ENCOUNTER — Ambulatory Visit (INDEPENDENT_AMBULATORY_CARE_PROVIDER_SITE_OTHER): Payer: Medicare Other

## 2020-07-14 VITALS — Ht 64.0 in | Wt 160.0 lb

## 2020-07-14 DIAGNOSIS — Z0000 Encounter for general adult medical examination without abnormal findings: Secondary | ICD-10-CM

## 2020-07-14 NOTE — Patient Instructions (Addendum)
Sherry Simon , Thank you for taking time to come for your Medicare Wellness Visit. I appreciate your ongoing commitment to your health goals. Please review the following plan we discussed and let me know if I can assist you in the future.   These are the goals we discussed: Goals      Patient Stated     Increase physical activity (pt-stated)      Walk more for exercise       This is a list of the screening recommended for you and due dates:  Health Maintenance  Topic Date Due   Tetanus Vaccine  08/31/2017   Complete foot exam   06/04/2018   Hemoglobin A1C  10/22/2020   Mammogram  02/24/2021   Eye exam for diabetics  06/13/2021   Colon Cancer Screening  07/15/2022   DEXA scan (bone density measurement)  Completed   COVID-19 Vaccine  Completed    Hepatitis C: One time screening is recommended by Center for Disease Control  (CDC) for  adults born from [birth year] through 1965.   Completed   Flu Shot  Discontinued   Pneumonia vaccines  Discontinued    Immunizations Immunization History  Administered Date(s) Administered   Influenza, High Dose Seasonal PF 06/08/2016, 07/02/2017, 07/17/2018, 07/28/2019   Influenza,inj,Quad PF,6+ Mos 06/18/2014, 05/23/2015   Influenza-Unspecified 06/08/2016, 07/02/2017   PFIZER SARS-COV-2 Vaccination 11/18/2019, 12/16/2019   Pneumococcal Conjugate-13 06/09/2016   Pneumococcal-Unspecified 06/08/2016   Tdap 09/01/2007   Keep all routine maintenance appointments.   Next scheduled nurse visit 08/02/20 @ 2:30 B12 injection  Follow up 08/03/20 @ 11:30  Advanced directives: End of life planning; Advance aging; Advanced directives discussed.  Copy of current HCPOA/Living Will requested.    Conditions/risks identified: none new  Follow up in one year for your annual wellness visit.   Preventive Care 74 Years and Older, Female Preventive care refers to lifestyle choices and visits with your health care provider that can promote health  and wellness. What does preventive care include?  A yearly physical exam. This is also called an annual well check.  Dental exams once or twice a year.  Routine eye exams. Ask your health care provider how often you should have your eyes checked.  Personal lifestyle choices, including:  Daily care of your teeth and gums.  Regular physical activity.  Eating a healthy diet.  Avoiding tobacco and drug use.  Limiting alcohol use.  Practicing safe sex.  Taking low-dose aspirin every day.  Taking vitamin and mineral supplements as recommended by your health care provider. What happens during an annual well check? The services and screenings done by your health care provider during your annual well check will depend on your age, overall health, lifestyle risk factors, and family history of disease. Counseling  Your health care provider may ask you questions about your:  Alcohol use.  Tobacco use.  Drug use.  Emotional well-being.  Home and relationship well-being.  Sexual activity.  Eating habits.  History of falls.  Memory and ability to understand (cognition).  Work and work Statistician.  Reproductive health. Screening  You may have the following tests or measurements:  Height, weight, and BMI.  Blood pressure.  Lipid and cholesterol levels. These may be checked every 5 years, or more frequently if you are over 15 years old.  Skin check.  Lung cancer screening. You may have this screening every year starting at age 60 if you have a 30-pack-year history of smoking and currently smoke or have  quit within the past 15 years.  Fecal occult blood test (FOBT) of the stool. You may have this test every year starting at age 56.  Flexible sigmoidoscopy or colonoscopy. You may have a sigmoidoscopy every 5 years or a colonoscopy every 10 years starting at age 13.  Hepatitis C blood test.  Hepatitis B blood test.  Sexually transmitted disease (STD)  testing.  Diabetes screening. This is done by checking your blood sugar (glucose) after you have not eaten for a while (fasting). You may have this done every 1-3 years.  Bone density scan. This is done to screen for osteoporosis. You may have this done starting at age 58.  Mammogram. This may be done every 1-2 years. Talk to your health care provider about how often you should have regular mammograms. Talk with your health care provider about your test results, treatment options, and if necessary, the need for more tests. Vaccines  Your health care provider may recommend certain vaccines, such as:  Influenza vaccine. This is recommended every year.  Tetanus, diphtheria, and acellular pertussis (Tdap, Td) vaccine. You may need a Td booster every 10 years.  Zoster vaccine. You may need this after age 52.  Pneumococcal 13-valent conjugate (PCV13) vaccine. One dose is recommended after age 81.  Pneumococcal polysaccharide (PPSV23) vaccine. One dose is recommended after age 82. Talk to your health care provider about which screenings and vaccines you need and how often you need them. This information is not intended to replace advice given to you by your health care provider. Make sure you discuss any questions you have with your health care provider. Document Released: 10/07/2015 Document Revised: 05/30/2016 Document Reviewed: 07/12/2015 Elsevier Interactive Patient Education  2017 [place] Prevention in the Home Falls can cause injuries. They can happen to people of all ages. There are many things you can do to make your home safe and to help prevent falls. What can I do on the outside of my home?  Regularly fix the edges of walkways and driveways and fix any cracks.  Remove anything that might make you trip as you walk through a door, such as a raised step or threshold.  Trim any bushes or trees on the path to your home.  Use bright outdoor lighting.  Clear any walking  paths of anything that might make someone trip, such as rocks or tools.  Regularly check to see if handrails are loose or broken. Make sure that both sides of any steps have handrails.  Any raised decks and porches should have guardrails on the edges.  Have any leaves, snow, or ice cleared regularly.  Use sand or salt on walking paths during winter.  Clean up any spills in your garage right away. This includes oil or grease spills. What can I do in the bathroom?  Use night lights.  Install grab bars by the toilet and in the tub and shower. Do not use towel bars as grab bars.  Use non-skid mats or decals in the tub or shower.  If you need to sit down in the shower, use a plastic, non-slip stool.  Keep the floor dry. Clean up any water that spills on the floor as soon as it happens.  Remove soap buildup in the tub or shower regularly.  Attach bath mats securely with double-sided non-slip rug tape.  Do not have throw rugs and other things on the floor that can make you trip. What can I do in the bedroom?  Use night lights.  Make sure that you have a light by your bed that is easy to reach.  Do not use any sheets or blankets that are too big for your bed. They should not hang down onto the floor.  Have a firm chair that has side arms. You can use this for support while you get dressed.  Do not have throw rugs and other things on the floor that can make you trip. What can I do in the kitchen?  Clean up any spills right away.  Avoid walking on wet floors.  Keep items that you use a lot in easy-to-reach places.  If you need to reach something above you, use a strong step stool that has a grab bar.  Keep electrical cords out of the way.  Do not use floor polish or wax that makes floors slippery. If you must use wax, use non-skid floor wax.  Do not have throw rugs and other things on the floor that can make you trip. What can I do with my stairs?  Do not leave any items  on the stairs.  Make sure that there are handrails on both sides of the stairs and use them. Fix handrails that are broken or loose. Make sure that handrails are as long as the stairways.  Check any carpeting to make sure that it is firmly attached to the stairs. Fix any carpet that is loose or worn.  Avoid having throw rugs at the top or bottom of the stairs. If you do have throw rugs, attach them to the floor with carpet tape.  Make sure that you have a light switch at the top of the stairs and the bottom of the stairs. If you do not have them, ask someone to add them for you. What else can I do to help prevent falls?  Wear shoes that:  Do not have high heels.  Have rubber bottoms.  Are comfortable and fit you well.  Are closed at the toe. Do not wear sandals.  If you use a stepladder:  Make sure that it is fully opened. Do not climb a closed stepladder.  Make sure that both sides of the stepladder are locked into place.  Ask someone to hold it for you, if possible.  Clearly mark and make sure that you can see:  Any grab bars or handrails.  First and last steps.  Where the edge of each step is.  Use tools that help you move around (mobility aids) if they are needed. These include:  Canes.  Walkers.  Scooters.  Crutches.  Turn on the lights when you go into a dark area. Replace any light bulbs as soon as they burn out.  Set up your furniture so you have a clear path. Avoid moving your furniture around.  If any of your floors are uneven, fix them.  If there are any pets around you, be aware of where they are.  Review your medicines with your doctor. Some medicines can make you feel dizzy. This can increase your chance of falling. Ask your doctor what other things that you can do to help prevent falls. This information is not intended to replace advice given to you by your health care provider. Make sure you discuss any questions you have with your health care  provider. Document Released: 07/07/2009 Document Revised: 02/16/2016 Document Reviewed: 10/15/2014 Elsevier Interactive Patient Education  2017 [employer].

## 2020-07-14 NOTE — Progress Notes (Addendum)
Subjective:   Sherry Simon is a 74 y.o. female who presents for Medicare Annual (Subsequent) preventive examination.  Review of Systems    No ROS.  Medicare Wellness Virtual Visit.   Cardiac Risk Factors include: advanced age (>23men, >85 women);hypertension;diabetes mellitus     Objective:    Today's Vitals   07/14/20 1037  Weight: 160 lb (72.6 kg)  Height: 5\' 4"  (1.626 m)   Body mass index is 27.46 kg/m.  Advanced Directives 09/03/2019 07/14/2019 07/24/2017 10/11/2015  Does Patient Have a Medical Advance Directive? No Yes Yes Yes  Type of Advance Directive - [place];Living will Living will [place];Living will  Does patient want to make changes to medical advance directive? - No - Patient declined - No - Patient declined  Copy of [place] in Chart? - No - copy requested - No - copy requested    Current Medications (verified) Outpatient Encounter Medications as of 07/14/2020  Medication Sig  . acyclovir (ZOVIRAX) 400 MG tablet TAKE 1 TABLET BY MOUTH  DAILY  . aspirin 81 MG EC tablet Take by mouth.  Marland Kitchen atorvastatin (LIPITOR) 40 MG tablet TAKE 1 TABLET BY MOUTH  DAILY  . Calcium Carbonate-Vitamin D 600-400 MG-UNIT tablet Take by mouth.  . citalopram (CELEXA) 40 MG tablet TAKE 1 TABLET BY MOUTH  DAILY  . clobetasol ointment (TEMOVATE) 0.05 % Apply pea size amount daily x 4 weeks, then every other day x 4 weeks, then 2-3 times weekly for maintenance  . diclofenac (FLECTOR) 1.3 % PTCH Flector 1.3 % transdermal 12 hour patch  Apply 1 patch twice a day by transdermal route as needed.  . ferrous sulfate 325 (65 FE) MG EC tablet Take 325 mg by mouth daily with breakfast.   . gabapentin (NEURONTIN) 600 MG tablet TAKE 2 TABLETS BY MOUTH IN  THE MORNING AND 3 TABLETS  BY MOUTH IN THE EVENING  . glucose blood (ONE TOUCH ULTRA TEST) test strip TEST BLOOD SUGAR TWICE A DAY  . HYDROcodone-acetaminophen (NORCO) 10-325 MG  tablet TAKE 1 TABLET BY MOUTH THREE TIMES DAILY AS NEEDED FOR CHRONIC PAIN  . latanoprost (XALATAN) 0.005 % ophthalmic solution INSTILL 1 DROP INTO THE  LEFT EYE AT BEDTIME  (REFRIGERATE UNTIL FIRST  OPENED FOR USE) (Patient taking differently: Place 1 drop into both eyes. )  . Melatonin 5 MG CAPS Take by mouth.  . metFORMIN (GLUCOPHAGE) 500 MG tablet TAKE 2 TABLETS BY MOUTH IN  THE MORNING AND TAKE 1  TABLET AT SUPPER  . nystatin cream (MYCOSTATIN) Apply 1 application topically 2 (two) times daily.  . pantoprazole (PROTONIX) 40 MG tablet TAKE 1 TABLET BY MOUTH  TWICE DAILY  . timolol (TIMOPTIC) 0.5 % ophthalmic solution Apply to eye.   No facility-administered encounter medications on file as of 07/14/2020.    Allergies (verified) Dorzolamide and Ivp dye [iodinated diagnostic agents]   History: Past Medical History:  Diagnosis Date  . Anxiety and depression   . Colon polyps   . Depression   . Diabetes mellitus ([place])   . Endometriosis    requiring hysterectomy  . History of chicken pox   . Hypercholesterolemia   . Hypertension   . Nephrolithiasis   . Pericarditis    recurrent, unkown origin  . Tachycardia    Past Surgical History:  Procedure Laterality Date  . ABDOMINAL HYSTERECTOMY  1980  . APPENDECTOMY  1055  . BACK SURGERY  1005-2010   laminectomy  .  CHOLECYSTECTOMY     open  . OOPHORECTOMY  1982  . TONSILLECTOMY     Family History  Problem Relation Age of Onset  . Heart disease Father        myocardial infarction - died 38  . Thyroid disease Mother   . Transient ischemic attack Mother        multiple  . Breast cancer Mother   . Hyperlipidemia Mother   . Kidney disease Mother   . Diabetes Mother   . Rheumatic fever Sister        mitral valve problems  . Breast cancer Paternal Aunt   . Other Sister        Small vessel disease  . Colon cancer Neg Hx    Social History   Socioeconomic History  . Marital status: Married    Spouse name: Not on file  .  Number of children: 3  . Years of education: Not on file  . Highest education level: Not on file  Occupational History  . Not on file  Tobacco Use  . Smoking status: Never Smoker  . Smokeless tobacco: Never Used  Vaping Use  . Vaping Use: Never used  Substance and Sexual Activity  . Alcohol use: No    Alcohol/week: 0.0 standard drinks  . Drug use: No  . Sexual activity: Never  Other Topics Concern  . Not on file  Social History Narrative  . Not on file   Social Determinants of Health   Financial Resource Strain:   . Difficulty of Paying Living Expenses: Not on file  Food Insecurity:   . Worried About Charity fundraiser in the Last Year: Not on file  . Ran Out of Food in the Last Year: Not on file  Transportation Needs:   . Lack of Transportation (Medical): Not on file  . Lack of Transportation (Non-Medical): Not on file  Physical Activity:   . Days of Exercise per Week: Not on file  . Minutes of Exercise per Session: Not on file  Stress:   . Feeling of Stress : Not on file  Social Connections:   . Frequency of Communication with Friends and Family: Not on file  . Frequency of Social Gatherings with Friends and Family: Not on file  . Attends Religious Services: Not on file  . Active Member of Clubs or Organizations: Not on file  . Attends Archivist Meetings: Not on file  . Marital Status: Not on file    Tobacco Counseling Counseling given: Not Answered   Clinical Intake:  Pre-visit preparation completed: Yes        Diabetes: Yes (Followed by pcp)  How often do you need to have someone help you when you read instructions, pamphlets, or other written materials from your doctor or pharmacy?: 1 - Never       Activities of Daily Living In your present state of health, do you have any difficulty performing the following activities: 07/14/2020  Hearing? N  Vision? N  Difficulty concentrating or making decisions? N  Walking or climbing stairs? Y   Comment Chronic back pain.  Dressing or bathing? N  Doing errands, shopping? N  Preparing Food and eating ? N  Using the Toilet? N  In the past six months, have you accidently leaked urine? N  Do you have problems with loss of bowel control? N  Managing your Medications? N  Managing your Finances? N  Housekeeping or managing your Housekeeping? N  Some recent  data might be hidden    Patient Care Team: Einar Pheasant, MD as PCP - General (Internal Medicine)  Indicate any recent Medical Services you may have received from other than [facility] providers in the past year (date may be approximate).     Assessment:   This is a routine wellness examination for [place].  I connected with Janee today by telephone and verified that I am speaking with the correct person using two identifiers. Location patient: home Location provider: work Persons participating in the virtual visit: patient, Marine scientist.    I discussed the limitations, risks, security and privacy concerns of performing an evaluation and management service by telephone and the availability of in person appointments. The patient expressed understanding and verbally consented to this telephonic visit.    Interactive audio and video telecommunications were attempted between this provider and patient, however failed, due to patient having technical difficulties OR patient did not have access to video capability.  We continued and completed visit with audio only.  Some vital signs may be absent or patient reported.   Hearing/Vision screen  Hearing Screening   125Hz  250Hz  500Hz  1000Hz  2000Hz  3000Hz  4000Hz  6000Hz  8000Hz   Right ear:           Left ear:           Comments: Patient is able to hear conversational tones without difficulty.  No issues reported.  Vision Screening Comments: Follow[facility], Dr. Volanda Napoleon  Visits q 3 months  Partial retina detachment  Glaucoma  Dietary issues and exercise activities  discussed: Current Exercise Habits: The patient does not participate in regular exercise at present Healthy diet Good water Intake  Goals      Patient Stated   .  Increase physical activity (pt-stated)      Walk more for exercise      Depression Screen PHQ 2/9 Scores 07/14/2020 07/14/2019 02/06/2018 01/04/2017 11/03/2015 10/11/2015 11/04/2014  PHQ - 2 Score 0 0 3 0 0 0 3  PHQ- 9 Score - - 10 - - - 6    Fall Risk Fall Risk  07/14/2020 01/28/2020 07/14/2019 01/04/2017 11/03/2015  Falls in the past year? 0 0 1 No No  Number falls in past yr: 0 - 0 - -  Injury with Fall? - - 0 - -  Risk for fall due to : - - Impaired mobility History of fall(s) -  Risk for fall due to: Comment - - Foot drop - -  Follow up Falls evaluation completed Falls evaluation completed Falls prevention discussed - -   Handrails in use when climbing stairs? Yes Home free of loose throw rugs in walkways, pet beds, electrical cords, etc? Yes  Adequate lighting in your home to reduce risk of falls? Yes   ASSISTIVE DEVICES UTILIZED TO PREVENT FALLS: Use of a cane, walker or w/c? No   TIMED UP AND GO: Was the test performed? No .  Virtual visits.   Cognitive Function: Patient is alert and oriented x3.  Denies difficulty focusing, making decisions, memory delay.   MMSE - Mini Mental State Exam 10/11/2015  Orientation to time 5  Orientation to Place 5  Registration 3  Attention/ Calculation 5  Recall 3  Language- name 2 objects 2  Language- repeat 1  Language- follow 3 step command 3  Language- read & follow direction 1  Write a sentence 1  Copy design 1  Total score 30     6CIT Screen 07/14/2019  What Year? 0  points  What month? 0 points  What time? 0 points  Count back from 20 0 points  Months in reverse 0 points  Repeat phrase 0 points  Total Score 0    Immunizations Immunization History  Administered Date(s) Administered  . Influenza, High Dose Seasonal PF 06/08/2016, 07/02/2017, 07/17/2018,  07/28/2019  . Influenza,inj,Quad PF,6+ Mos 06/18/2014, 05/23/2015  . Influenza-Unspecified 06/08/2016, 07/02/2017  . PFIZER SARS-COV-2 Vaccination 11/18/2019, 12/16/2019  . Pneumococcal Conjugate-13 06/09/2016  . Pneumococcal-Unspecified 06/08/2016  . Tdap 09/01/2007    TDAP status: Due, Education has been provided regarding the importance of this vaccine. Advised may receive this vaccine at local pharmacy or Health Dept. Aware to provide a copy of the vaccination record if obtained from local pharmacy or Health Dept. Verbalized acceptance and understanding. Deferred.  Health Maintenance Health Maintenance  Topic Date Due  . TETANUS/TDAP  08/31/2017  . FOOT EXAM  06/04/2018  . HEMOGLOBIN A1C  10/22/2020  . MAMMOGRAM  02/24/2021  . OPHTHALMOLOGY EXAM  06/13/2021  . COLONOSCOPY  07/15/2022  . DEXA SCAN  Completed  . COVID-19 Vaccine  Completed  . Hepatitis C Screening  Completed  . INFLUENZA VACCINE  Discontinued  . PNA vac Low Risk Adult  Discontinued   Dental Screening: Recommended annual dental exams for proper oral hygiene. Visits every 6 months.   Community Resource Referral / Chronic Care Management: CRR required this visit?  No   CCM required this visit?  No      Plan:   Keep all routine maintenance appointments.   Follow up 08/03/20 @ 11:30 B12  I have personally reviewed and noted the following in the patient's chart:   . Medical and social history . Use of alcohol, tobacco or illicit drugs  . Current medications and supplements . Functional ability and status . Nutritional status . Physical activity . Advanced directives . List of other physicians . Hospitalizations, surgeries, and ER visits in previous 12 months . Vitals . Screenings to include cognitive, depression, and falls . Referrals and appointments  In addition, I have reviewed and discussed with patient certain preventive protocols, quality metrics, and best practice recommendations. A written  personalized care plan for preventive services as well as general preventive health recommendations were provided to patient via mychart.     Varney Biles, LPN   [id number]     Reviewed above information.  Agree with assessment and plan.    Dr Nicki Reaper

## 2020-07-27 DIAGNOSIS — M4722 Other spondylosis with radiculopathy, cervical region: Secondary | ICD-10-CM | POA: Diagnosis not present

## 2020-07-27 DIAGNOSIS — G894 Chronic pain syndrome: Secondary | ICD-10-CM | POA: Diagnosis not present

## 2020-07-27 DIAGNOSIS — M5416 Radiculopathy, lumbar region: Secondary | ICD-10-CM | POA: Diagnosis not present

## 2020-08-02 ENCOUNTER — Ambulatory Visit: Payer: Medicare Other

## 2020-08-03 ENCOUNTER — Encounter: Payer: Self-pay | Admitting: Internal Medicine

## 2020-08-03 ENCOUNTER — Ambulatory Visit (INDEPENDENT_AMBULATORY_CARE_PROVIDER_SITE_OTHER): Payer: Medicare Other | Admitting: Internal Medicine

## 2020-08-03 ENCOUNTER — Other Ambulatory Visit: Payer: Self-pay

## 2020-08-03 VITALS — BP 130/72 | HR 88 | Temp 97.9°F | Resp 16 | Ht 64.0 in | Wt 161.0 lb

## 2020-08-03 DIAGNOSIS — K219 Gastro-esophageal reflux disease without esophagitis: Secondary | ICD-10-CM

## 2020-08-03 DIAGNOSIS — D649 Anemia, unspecified: Secondary | ICD-10-CM | POA: Diagnosis not present

## 2020-08-03 DIAGNOSIS — E1165 Type 2 diabetes mellitus with hyperglycemia: Secondary | ICD-10-CM | POA: Diagnosis not present

## 2020-08-03 DIAGNOSIS — E7800 Pure hypercholesterolemia, unspecified: Secondary | ICD-10-CM

## 2020-08-03 DIAGNOSIS — E538 Deficiency of other specified B group vitamins: Secondary | ICD-10-CM

## 2020-08-03 DIAGNOSIS — R062 Wheezing: Secondary | ICD-10-CM

## 2020-08-03 DIAGNOSIS — F439 Reaction to severe stress, unspecified: Secondary | ICD-10-CM

## 2020-08-03 DIAGNOSIS — M546 Pain in thoracic spine: Secondary | ICD-10-CM

## 2020-08-03 DIAGNOSIS — Z853 Personal history of malignant neoplasm of breast: Secondary | ICD-10-CM

## 2020-08-03 MED ORDER — CYANOCOBALAMIN 1000 MCG/ML IJ SOLN
1000.0000 ug | Freq: Once | INTRAMUSCULAR | Status: AC
  Administered 2020-08-03: 1000 ug via INTRAMUSCULAR

## 2020-08-03 NOTE — Patient Instructions (Signed)
pepcid 20mg  - take before bed

## 2020-08-03 NOTE — Progress Notes (Signed)
Patient ID: Sherry Simon, female   DOB: [birth year], 74 y.o.   MRN: [id number]   Subjective:    Patient ID: Sherry Simon, female    DOB: Oct 21, 1945, 74 y.o.   MRN: [id number]  HPI This visit occurred during the SARS-CoV-2 public health emergency.  Safety protocols were in place, including screening questions prior to the visit, additional usage of staff PPE, and extensive cleaning of exam room while observing appropriate contact time as indicated for disinfecting solutions.  Patient here for a scheduled follow up.  Persistent increased back pain.  Followed by neurosurgery.  On hydrocodone.  Lying down alleviates pain.  Planning for L4-L5 transforaminal injection 08/08/20.  On relafen.  Taking colace daily.  No chest pain or sob reported.  No abdominal pain or bowel change reported.  Some occasional wheezing.  Notices intermittently - off an on.  Having worsening acid reflux.  Some cough - over last two months.  Taking protonix twice a day.  Not taking before she eats.  Blood pressure doing well.    Past Medical History:  Diagnosis Date  . Anxiety and depression   . Colon polyps   . Depression   . Diabetes mellitus ([place])   . Endometriosis    requiring hysterectomy  . History of chicken pox   . Hypercholesterolemia   . Hypertension   . Nephrolithiasis   . Pericarditis    recurrent, unkown origin  . Tachycardia    Past Surgical History:  Procedure Laterality Date  . ABDOMINAL HYSTERECTOMY  1980  . APPENDECTOMY  1055  . BACK SURGERY  1005-2010   laminectomy  . CHOLECYSTECTOMY     open  . OOPHORECTOMY  1982  . TONSILLECTOMY     Family History  Problem Relation Age of Onset  . Heart disease Father        myocardial infarction - died 8  . Thyroid disease Mother   . Transient ischemic attack Mother        multiple  . Breast cancer Mother   . Hyperlipidemia Mother   . Kidney disease Mother   . Diabetes Mother   . Rheumatic fever Sister        mitral valve problems    . Breast cancer Paternal Aunt   . Other Sister        Small vessel disease  . Colon cancer Neg Hx    Social History   Socioeconomic History  . Marital status: Married    Spouse name: Not on file  . Number of children: 3  . Years of education: Not on file  . Highest education level: Not on file  Occupational History  . Not on file  Tobacco Use  . Smoking status: Never Smoker  . Smokeless tobacco: Never Used  Vaping Use  . Vaping Use: Never used  Substance and Sexual Activity  . Alcohol use: No    Alcohol/week: 0.0 standard drinks  . Drug use: No  . Sexual activity: Never  Other Topics Concern  . Not on file  Social History Narrative  . Not on file   Social Determinants of Health   Financial Resource Strain:   . Difficulty of Paying Living Expenses: Not on file  Food Insecurity:   . Worried About Charity fundraiser in the Last Year: Not on file  . Ran Out of Food in the Last Year: Not on file  Transportation Needs:   . Lack of Transportation (Medical): Not on file  . Lack  of Transportation (Non-Medical): Not on file  Physical Activity:   . Days of Exercise per Week: Not on file  . Minutes of Exercise per Session: Not on file  Stress:   . Feeling of Stress : Not on file  Social Connections:   . Frequency of Communication with Friends and Family: Not on file  . Frequency of Social Gatherings with Friends and Family: Not on file  . Attends Religious Services: Not on file  . Active Member of Clubs or Organizations: Not on file  . Attends Archivist Meetings: Not on file  . Marital Status: Not on file    Outpatient Encounter Medications as of 08/03/2020  Medication Sig  . acyclovir (ZOVIRAX) 400 MG tablet TAKE 1 TABLET BY MOUTH  DAILY  . aspirin 81 MG EC tablet Take by mouth.  Marland Kitchen atorvastatin (LIPITOR) 40 MG tablet TAKE 1 TABLET BY MOUTH  DAILY  . Calcium Carbonate-Vitamin D 600-400 MG-UNIT tablet Take by mouth.  . citalopram (CELEXA) 40 MG tablet TAKE  1 TABLET BY MOUTH  DAILY  . clobetasol ointment (TEMOVATE) 0.05 % Apply pea size amount daily x 4 weeks, then every other day x 4 weeks, then 2-3 times weekly for maintenance  . ferrous sulfate 325 (65 FE) MG EC tablet Take 325 mg by mouth daily with breakfast.   . gabapentin (NEURONTIN) 600 MG tablet TAKE 2 TABLETS BY MOUTH IN  THE MORNING AND 3 TABLETS  BY MOUTH IN THE EVENING (Patient taking differently: Take 600 mg by mouth 2 (two) times daily. )  . glucose blood (ONE TOUCH ULTRA TEST) test strip TEST BLOOD SUGAR TWICE A DAY  . HYDROcodone-acetaminophen (NORCO) 10-325 MG tablet TAKE 1 TABLET BY MOUTH THREE TIMES DAILY AS NEEDED FOR CHRONIC PAIN  . latanoprost (XALATAN) 0.005 % ophthalmic solution INSTILL 1 DROP INTO THE  LEFT EYE AT BEDTIME  (REFRIGERATE UNTIL FIRST  OPENED FOR USE) (Patient taking differently: Place 1 drop into both eyes. )  . Melatonin 5 MG CAPS Take by mouth.  . metFORMIN (GLUCOPHAGE) 500 MG tablet TAKE 2 TABLETS BY MOUTH IN  THE MORNING AND TAKE 1  TABLET AT SUPPER (Patient taking differently: Take 500 mg by mouth 2 (two) times daily with a meal. )  . pantoprazole (PROTONIX) 40 MG tablet TAKE 1 TABLET BY MOUTH  TWICE DAILY  . [DISCONTINUED] diclofenac (FLECTOR) 1.3 % PTCH Flector 1.3 % transdermal 12 hour patch  Apply 1 patch twice a day by transdermal route as needed.  . [DISCONTINUED] nystatin cream (MYCOSTATIN) Apply 1 application topically 2 (two) times daily.  . [DISCONTINUED] timolol (TIMOPTIC) 0.5 % ophthalmic solution Apply to eye.  . [EXPIRED] cyanocobalamin ((VITAMIN B-12)) injection 1,000 mcg    No facility-administered encounter medications on file as of 08/03/2020.    Review of Systems  Constitutional: Negative for appetite change and unexpected weight change.  HENT: Negative for congestion and sinus pressure.   Respiratory: Positive for cough and wheezing. Negative for chest tightness and shortness of breath.   Cardiovascular: Negative for chest pain,  palpitations and leg swelling.  Gastrointestinal: Negative for abdominal pain, diarrhea, nausea and vomiting.       Acid reflux as outlined.   Genitourinary: Negative for difficulty urinating and dysuria.  Musculoskeletal: Positive for back pain. Negative for joint swelling.  Skin: Negative for color change and rash.  Neurological: Negative for dizziness, light-headedness and headaches.  Psychiatric/Behavioral: Negative for agitation and dysphoric mood.       Objective:  Physical Exam Vitals reviewed.  Constitutional:      General: She is not in acute distress.    Appearance: Normal appearance.  HENT:     Head: Normocephalic and atraumatic.     Right Ear: External ear normal.     Left Ear: External ear normal.  Eyes:     General: No scleral icterus.       Right eye: No discharge.        Left eye: No discharge.     Conjunctiva/sclera: Conjunctivae normal.  Neck:     Thyroid: No thyromegaly.  Cardiovascular:     Rate and Rhythm: Normal rate and regular rhythm.  Pulmonary:     Effort: No respiratory distress.     Breath sounds: Normal breath sounds. No wheezing.  Abdominal:     General: Bowel sounds are normal.     Palpations: Abdomen is soft.     Tenderness: There is no abdominal tenderness.  Musculoskeletal:        General: No swelling or tenderness.     Cervical back: Neck supple. No tenderness.  Lymphadenopathy:     Cervical: No cervical adenopathy.  Skin:    Findings: No erythema or rash.  Neurological:     Mental Status: She is alert.  Psychiatric:        Mood and Affect: Mood normal.        Behavior: Behavior normal.     BP 130/72   Pulse 88   Temp 97.9 F (36.6 C) (Oral)   Resp 16   Ht '5\' 4"'  (1.626 m)   Wt 161 lb (73 kg)   SpO2 97%   BMI 27.64 kg/m  Wt Readings from Last 3 Encounters:  08/03/20 161 lb (73 kg)  07/14/20 160 lb (72.6 kg)  03/31/20 160 lb (72.6 kg)     Lab Results  Component Value Date   WBC 4.4 12/07/2019   HGB 12.7  12/07/2019   HCT 38.1 12/07/2019   PLT 295.0 12/07/2019   GLUCOSE 95 04/21/2020   CHOL 148 04/21/2020   TRIG 86.0 04/21/2020   HDL 51.20 04/21/2020   LDLCALC 80 04/21/2020   ALT 14 04/21/2020   AST 13 04/21/2020   NA 136 04/21/2020   K 4.4 04/21/2020   CL 101 04/21/2020   CREATININE 0.67 04/21/2020   BUN 12 04/21/2020   CO2 29 04/21/2020   TSH 1.52 06/12/2019   HGBA1C 6.2 04/21/2020   MICROALBUR <0.7 06/12/2019       Assessment & Plan:   Problem List Items Addressed This Visit    Wheezing    Wheezing and cough as outlined.  Lungs clear.  No persistent cough.  Treat acid reflux.  protonix bid before meals and add pepcid 53m before bed.  Follow closely.  Call with update.        Stress    On citalopram.  Discussed.  Follow.        Hypercholesterolemia    On lipitor.  Low cholesterol diet and exercise.  Follow lipid panel and liver function tests.        Relevant Orders   Hepatic function panel   Lipid panel   TSH   History of breast cancer    Followed by oncology.       GERD (gastroesophageal reflux disease)    Increased acid reflux.  Feel contributing to cough and wheezing.  Continue protonix bid - take before meals.  Add pepcid 264mbefore bed.  Follow.  Diabetes mellitus ([place])    On metformin.  Low carb diet and exercise.  Follow met b and a1c.       Relevant Orders   Hemoglobin E9B   Basic metabolic panel   Microalbumin / creatinine urine ratio   Back pain    Chronic increased back pain. On hydrocodone.  Planning for transforiminal injection 08/08/20.  Followed by [facility].       B12 deficiency - Primary   Anemia    Colonoscopy 2018.  Recommended f/u in 5 years.  Follow cbc.       Relevant Orders   CBC with Differential/Platelet       Einar Pheasant, MD

## 2020-08-06 ENCOUNTER — Encounter: Payer: Self-pay | Admitting: Internal Medicine

## 2020-08-06 DIAGNOSIS — R062 Wheezing: Secondary | ICD-10-CM | POA: Insufficient documentation

## 2020-08-06 NOTE — Assessment & Plan Note (Signed)
On lipitor.  Low cholesterol diet and exercise.  Follow lipid panel and liver function tests.   

## 2020-08-06 NOTE — Assessment & Plan Note (Signed)
Increased acid reflux.  Feel contributing to cough and wheezing.  Continue protonix bid - take before meals.  Add pepcid 20mg  before bed.  Follow.

## 2020-08-06 NOTE — Assessment & Plan Note (Signed)
On citalopram.  Discussed.  Follow.

## 2020-08-06 NOTE — Assessment & Plan Note (Signed)
Wheezing and cough as outlined.  Lungs clear.  No persistent cough.  Treat acid reflux.  protonix bid before meals and add pepcid 20mg  before bed.  Follow closely.  Call with update.

## 2020-08-06 NOTE — Assessment & Plan Note (Signed)
Colonoscopy 2018.  Recommended f/u in 5 years.  Follow cbc.

## 2020-08-06 NOTE — Assessment & Plan Note (Signed)
Followed by oncology 

## 2020-08-06 NOTE — Assessment & Plan Note (Signed)
On metformin.  Low carb diet and exercise.  Follow met b and a1c.   

## 2020-08-06 NOTE — Assessment & Plan Note (Signed)
Chronic increased back pain. On hydrocodone.  Planning for transforiminal injection 08/08/20.  Followed by [facility].

## 2020-08-08 DIAGNOSIS — M5136 Other intervertebral disc degeneration, lumbar region: Secondary | ICD-10-CM | POA: Diagnosis not present

## 2020-08-08 DIAGNOSIS — M5416 Radiculopathy, lumbar region: Secondary | ICD-10-CM | POA: Diagnosis not present

## 2020-08-16 ENCOUNTER — Other Ambulatory Visit: Payer: Self-pay | Admitting: Internal Medicine

## 2020-08-16 DIAGNOSIS — I10 Essential (primary) hypertension: Secondary | ICD-10-CM

## 2020-08-25 ENCOUNTER — Other Ambulatory Visit: Payer: Self-pay | Admitting: Internal Medicine

## 2020-08-25 DIAGNOSIS — K219 Gastro-esophageal reflux disease without esophagitis: Secondary | ICD-10-CM

## 2020-09-06 ENCOUNTER — Other Ambulatory Visit: Payer: Self-pay

## 2020-09-06 ENCOUNTER — Ambulatory Visit (INDEPENDENT_AMBULATORY_CARE_PROVIDER_SITE_OTHER): Payer: Medicare Other

## 2020-09-06 DIAGNOSIS — E538 Deficiency of other specified B group vitamins: Secondary | ICD-10-CM

## 2020-09-06 MED ORDER — CYANOCOBALAMIN 1000 MCG/ML IJ SOLN
1000.0000 ug | Freq: Once | INTRAMUSCULAR | Status: AC
  Administered 2020-09-06: 11:00:00 1000 ug via INTRAMUSCULAR

## 2020-09-06 NOTE — Progress Notes (Signed)
Patient presented for B 12 injection to left deltoid, patient voiced no concerns nor showed any signs of distress during injection. 

## 2020-09-26 ENCOUNTER — Other Ambulatory Visit: Payer: Medicare Other

## 2020-09-27 ENCOUNTER — Other Ambulatory Visit: Payer: Medicare Other

## 2020-09-28 ENCOUNTER — Other Ambulatory Visit: Payer: Medicare Other

## 2020-09-28 DIAGNOSIS — H401122 Primary open-angle glaucoma, left eye, moderate stage: Secondary | ICD-10-CM | POA: Diagnosis not present

## 2020-09-30 ENCOUNTER — Encounter: Payer: Self-pay | Admitting: Internal Medicine

## 2020-09-30 ENCOUNTER — Other Ambulatory Visit: Payer: Self-pay

## 2020-09-30 ENCOUNTER — Other Ambulatory Visit: Payer: Self-pay | Admitting: Internal Medicine

## 2020-09-30 ENCOUNTER — Ambulatory Visit (INDEPENDENT_AMBULATORY_CARE_PROVIDER_SITE_OTHER): Payer: Medicare Other | Admitting: Internal Medicine

## 2020-09-30 DIAGNOSIS — K219 Gastro-esophageal reflux disease without esophagitis: Secondary | ICD-10-CM

## 2020-09-30 DIAGNOSIS — R1031 Right lower quadrant pain: Secondary | ICD-10-CM

## 2020-09-30 DIAGNOSIS — E1165 Type 2 diabetes mellitus with hyperglycemia: Secondary | ICD-10-CM

## 2020-09-30 DIAGNOSIS — F439 Reaction to severe stress, unspecified: Secondary | ICD-10-CM | POA: Diagnosis not present

## 2020-09-30 DIAGNOSIS — G8929 Other chronic pain: Secondary | ICD-10-CM

## 2020-09-30 DIAGNOSIS — M5442 Lumbago with sciatica, left side: Secondary | ICD-10-CM | POA: Diagnosis not present

## 2020-09-30 DIAGNOSIS — L900 Lichen sclerosus et atrophicus: Secondary | ICD-10-CM | POA: Diagnosis not present

## 2020-09-30 DIAGNOSIS — Z853 Personal history of malignant neoplasm of breast: Secondary | ICD-10-CM

## 2020-09-30 DIAGNOSIS — E7800 Pure hypercholesterolemia, unspecified: Secondary | ICD-10-CM

## 2020-09-30 DIAGNOSIS — E041 Nontoxic single thyroid nodule: Secondary | ICD-10-CM

## 2020-09-30 DIAGNOSIS — D649 Anemia, unspecified: Secondary | ICD-10-CM | POA: Diagnosis not present

## 2020-09-30 MED ORDER — RABEPRAZOLE SODIUM 20 MG PO TBEC: 20.0000 mg | DELAYED_RELEASE_TABLET | Freq: Two times a day (BID) | ORAL | 1 refills | Status: DC

## 2020-09-30 MED ORDER — RABEPRAZOLE SODIUM 20 MG PO TBEC
20.0000 mg | DELAYED_RELEASE_TABLET | Freq: Two times a day (BID) | ORAL | 0 refills | Status: DC
Start: 2020-09-30 — End: 2020-09-30

## 2020-09-30 NOTE — Progress Notes (Signed)
Patient ID: Sherry Simon, female   DOB: 17-Apr-1946, 75 y.o.   MRN: [id number]   Subjective:    Patient ID: Sherry Simon, female    DOB: 11-04-1945, 75 y.o.   MRN: [id number]  HPI This visit occurred during the SARS-CoV-2 public health emergency.  Safety protocols were in place, including screening questions prior to the visit, additional usage of staff PPE, and extensive cleaning of exam room while observing appropriate contact time as indicated for disinfecting solutions.  Patient here for a scheduled follow up. Here to follow up regarding her blood sugar, blood pressure and acid reflux.  Has noticed some nausea.  Increased acid reflux after eating.  Some cough.  Increased gas.  Has been on protonix.  Persistent symptoms.  No chest pain or sob reported.  RLQ/right inguinal pain - increased pain with palpation.  No urine change.  Some vaginal burning and irritation.     Past Medical History:  Diagnosis Date   Anxiety and depression    Colon polyps    Depression    Diabetes mellitus ([place])    Endometriosis    requiring hysterectomy   History of chicken pox    Hypercholesterolemia    Hypertension    Nephrolithiasis    Pericarditis    recurrent, unkown origin   Tachycardia    Past Surgical History:  Procedure Laterality Date   ABDOMINAL HYSTERECTOMY  1980   APPENDECTOMY  62   BACK SURGERY  1005-2010   laminectomy   CHOLECYSTECTOMY     open   OOPHORECTOMY  1982   TONSILLECTOMY     Family History  Problem Relation Age of Onset   Heart disease Father        myocardial infarction - died 66   Thyroid disease Mother    Transient ischemic attack Mother        multiple   Breast cancer Mother    Hyperlipidemia Mother    Kidney disease Mother    Diabetes Mother    Rheumatic fever Sister        mitral valve problems   Breast cancer Paternal Aunt    Other Sister        Small vessel disease   Colon cancer Neg Hx    Social History    Socioeconomic History   Marital status: Married    Spouse name: Not on file   Number of children: 3   Years of education: Not on file   Highest education level: Not on file  Occupational History   Not on file  Tobacco Use   Smoking status: Never Smoker   Smokeless tobacco: Never Used  Vaping Use   Vaping Use: Never used  Substance and Sexual Activity   Alcohol use: No    Alcohol/week: 0.0 standard drinks   Drug use: No   Sexual activity: Never  Other Topics Concern   Not on file  Social History Narrative   Not on file   Social Determinants of Health   Financial Resource Strain: Not on file  Food Insecurity: Not on file  Transportation Needs: Not on file  Physical Activity: Not on file  Stress: Not on file  Social Connections: Not on file    Outpatient Encounter Medications as of 09/30/2020  Medication Sig   acyclovir (ZOVIRAX) 400 MG tablet TAKE 1 TABLET BY MOUTH  DAILY   aspirin 81 MG EC tablet Take by mouth.   atorvastatin (LIPITOR) 40 MG tablet TAKE 1 TABLET BY MOUTH  DAILY  Calcium Carbonate-Vitamin D 600-400 MG-UNIT tablet Take by mouth.   citalopram (CELEXA) 40 MG tablet TAKE 1 TABLET BY MOUTH  DAILY   clobetasol ointment (TEMOVATE) 0.05 % Apply pea size amount daily x 4 weeks, then every other day x 4 weeks, then 2-3 times weekly for maintenance   ferrous sulfate 325 (65 FE) MG EC tablet Take 325 mg by mouth daily with breakfast.    gabapentin (NEURONTIN) 600 MG tablet TAKE 2 TABLETS BY MOUTH IN  THE MORNING AND 3 TABLETS  BY MOUTH IN THE EVENING (Patient taking differently: Take 600 mg by mouth 2 (two) times daily.)   glucose blood (ONE TOUCH ULTRA TEST) test strip TEST BLOOD SUGAR TWICE A DAY   HYDROcodone-acetaminophen (NORCO) 10-325 MG tablet TAKE 1 TABLET BY MOUTH THREE TIMES DAILY AS NEEDED FOR CHRONIC PAIN   latanoprost (XALATAN) 0.005 % ophthalmic solution INSTILL 1 DROP INTO THE  LEFT EYE AT BEDTIME  (REFRIGERATE UNTIL FIRST   OPENED FOR USE) (Patient taking differently: Place 1 drop into both eyes.)   Melatonin 5 MG CAPS Take by mouth.   metFORMIN (GLUCOPHAGE) 500 MG tablet TAKE 2 TABLETS BY MOUTH IN  THE MORNING AND TAKE 1  TABLET AT SUPPER (Patient taking differently: Take 500 mg by mouth 2 (two) times daily with a meal.)   [DISCONTINUED] pantoprazole (PROTONIX) 40 MG tablet TAKE 1 TABLET BY MOUTH  TWICE DAILY   [DISCONTINUED] RABEprazole (ACIPHEX) 20 MG tablet Take 1 tablet (20 mg total) by mouth 2 (two) times daily.   [DISCONTINUED] losartan (COZAAR) 100 MG tablet TAKE 1 TABLET BY MOUTH  DAILY (Patient not taking: Reported on 09/30/2020)   [DISCONTINUED] RABEprazole (ACIPHEX) 20 MG tablet Take 1 tablet (20 mg total) by mouth 2 (two) times daily.   No facility-administered encounter medications on file as of 09/30/2020.    Review of Systems  Constitutional: Negative for appetite change and unexpected weight change.  HENT: Negative for congestion and sinus pressure.   Respiratory: Negative for cough, chest tightness and shortness of breath.   Cardiovascular: Negative for chest pain, palpitations and leg swelling.  Gastrointestinal: Positive for nausea. Negative for abdominal pain and vomiting.       Increased gas. Increased acid reflux.    Genitourinary: Negative for difficulty urinating and dysuria.  Musculoskeletal: Negative for joint swelling and myalgias.  Skin: Negative for color change and rash.  Neurological: Negative for dizziness, light-headedness and headaches.  Psychiatric/Behavioral: Negative for agitation and dysphoric mood.       Objective:    Physical Exam Vitals reviewed.  Constitutional:      General: She is not in acute distress.    Appearance: Normal appearance.  HENT:     Head: Normocephalic and atraumatic.     Right Ear: External ear normal.     Left Ear: External ear normal.     Mouth/Throat:     Mouth: Oropharynx is clear and moist.  Eyes:     General: No scleral icterus.        Right eye: No discharge.        Left eye: No discharge.     Conjunctiva/sclera: Conjunctivae normal.  Neck:     Thyroid: No thyromegaly.  Cardiovascular:     Rate and Rhythm: Normal rate and regular rhythm.  Pulmonary:     Effort: No respiratory distress.     Breath sounds: Normal breath sounds. No wheezing.  Abdominal:     General: Bowel sounds are normal.  Palpations: Abdomen is soft.     Tenderness: There is no abdominal tenderness.     Comments: Pain to palpation - right inguinal area.   Musculoskeletal:        General: No swelling, tenderness or edema.     Cervical back: Neck supple. No tenderness.  Lymphadenopathy:     Cervical: No cervical adenopathy.  Skin:    Findings: No erythema or rash.  Neurological:     Mental Status: She is alert.  Psychiatric:        Mood and Affect: Mood normal.        Behavior: Behavior normal.     BP 114/68 (BP Location: Left Arm, Patient Position: Sitting)    Pulse 83    Temp 97.7 F (36.5 C)    Ht 5' 4.02" (1.626 m)    Wt 162 lb 6.4 oz (73.7 kg)    SpO2 96%    BMI 27.86 kg/m  Wt Readings from Last 3 Encounters:  09/30/20 162 lb 6.4 oz (73.7 kg)  08/03/20 161 lb (73 kg)  07/14/20 160 lb (72.6 kg)     Lab Results  Component Value Date   WBC 4.4 12/07/2019   HGB 12.7 12/07/2019   HCT 38.1 12/07/2019   PLT 295.0 12/07/2019   GLUCOSE 95 04/21/2020   CHOL 148 04/21/2020   TRIG 86.0 04/21/2020   HDL 51.20 04/21/2020   LDLCALC 80 04/21/2020   ALT 14 04/21/2020   AST 13 04/21/2020   NA 136 04/21/2020   K 4.4 04/21/2020   CL 101 04/21/2020   CREATININE 0.67 04/21/2020   BUN 12 04/21/2020   CO2 29 04/21/2020   TSH 1.52 06/12/2019   HGBA1C 6.2 04/21/2020   MICROALBUR <0.7 06/12/2019       Assessment & Plan:   Problem List Items Addressed This Visit    Chronic back pain    Followed by neurosurgery.       GERD (gastroesophageal reflux disease)    Increased acid reflux.  On protonix.  Desires to change PPI.   Stop protonix.  Start aciphex 33m bid.  Discussed given persistent symptoms despite PPI, GI evaluation.  She wants to try aciphex first and see if controls.  Follow.       Hypercholesterolemia    On lipitor.  Low cholesterol diet and exercise.  Follow lipid panel and liver function tests.        Diabetes [place]    On metformin.  Low carb diet and exercise.  Follow met b anda 1c.       History of breast cancer    Followed by oncology.       Thyroid nodule    S/p previous biopsy.  Follow tsh.       Stress    On citalopram.  Overall handling things well.  Follow.       Anemia    Colonoscopy 2018.  Recommended f/u in 5 years.        Lichen sclerosus    Has seen gyn.       Right inguinal pain    Persistent right inguinal pain - pain on palpation.  Question of hernia.  Will have surgery evaluate.        Relevant Orders   Ambulatory referral to General Surgery       CEinar Pheasant MD

## 2020-10-02 ENCOUNTER — Encounter: Payer: Self-pay | Admitting: Internal Medicine

## 2020-10-02 DIAGNOSIS — R1031 Right lower quadrant pain: Secondary | ICD-10-CM | POA: Insufficient documentation

## 2020-10-02 NOTE — Assessment & Plan Note (Signed)
Colonoscopy 2018.  Recommended f/u in 5 years.   

## 2020-10-02 NOTE — Assessment & Plan Note (Signed)
On citalopram.  Overall handling things well.  Follow.

## 2020-10-02 NOTE — Assessment & Plan Note (Signed)
Followed by neurosurgery.  

## 2020-10-02 NOTE — Assessment & Plan Note (Signed)
On metformin.  Low carb diet and exercise.  Follow met b and a1c.   

## 2020-10-02 NOTE — Assessment & Plan Note (Signed)
Has seen gyn.

## 2020-10-02 NOTE — Assessment & Plan Note (Signed)
S/p previous biopsy.  Follow tsh.  

## 2020-10-02 NOTE — Assessment & Plan Note (Signed)
On lipitor.  Low cholesterol diet and exercise.  Follow lipid panel and liver function tests.   

## 2020-10-02 NOTE — Assessment & Plan Note (Signed)
Persistent right inguinal pain - pain on palpation.  Question of hernia.  Will have surgery evaluate.

## 2020-10-02 NOTE — Assessment & Plan Note (Signed)
Followed by oncology 

## 2020-10-02 NOTE — Assessment & Plan Note (Signed)
Increased acid reflux.  On protonix.  Desires to change PPI.  Stop protonix.  Start aciphex 20mg  bid.  Discussed given persistent symptoms despite PPI, GI evaluation.  She wants to try aciphex first and see if controls.  Follow.

## 2020-10-03 ENCOUNTER — Other Ambulatory Visit: Payer: Medicare Other

## 2020-10-07 ENCOUNTER — Ambulatory Visit (INDEPENDENT_AMBULATORY_CARE_PROVIDER_SITE_OTHER): Payer: Medicare Other

## 2020-10-07 DIAGNOSIS — E538 Deficiency of other specified B group vitamins: Secondary | ICD-10-CM | POA: Diagnosis not present

## 2020-10-07 MED ORDER — CYANOCOBALAMIN 1000 MCG/ML IJ SOLN
1000.0000 ug | Freq: Once | INTRAMUSCULAR | Status: AC
  Administered 2020-10-07: 1000 ug via INTRAMUSCULAR

## 2020-10-07 NOTE — Progress Notes (Signed)
Patient presented for B 12 injection to left deltoid, patient voiced no concerns nor showed any signs of distress during injection. 

## 2020-10-12 ENCOUNTER — Other Ambulatory Visit (INDEPENDENT_AMBULATORY_CARE_PROVIDER_SITE_OTHER): Payer: Medicare Other

## 2020-10-12 ENCOUNTER — Other Ambulatory Visit: Payer: Self-pay

## 2020-10-12 DIAGNOSIS — E7800 Pure hypercholesterolemia, unspecified: Secondary | ICD-10-CM | POA: Diagnosis not present

## 2020-10-12 DIAGNOSIS — D649 Anemia, unspecified: Secondary | ICD-10-CM

## 2020-10-12 DIAGNOSIS — E1165 Type 2 diabetes mellitus with hyperglycemia: Secondary | ICD-10-CM

## 2020-10-12 LAB — LIPID PANEL
Cholesterol: 141 mg/dL (ref 0–200)
HDL: 54.6 mg/dL (ref >39.00)
LDL Cholesterol: 69 mg/dL (ref 0–99)
NonHDL: 86.01
Total CHOL/HDL Ratio: 3
Triglycerides: 86 mg/dL (ref 0.0–149.0)
VLDL: 17.2 mg/dL (ref 0.0–40.0)

## 2020-10-12 LAB — MICROALBUMIN / CREATININE URINE RATIO
Creatinine,U: 173.4 mg/dL
Microalb Creat Ratio: 0.6 mg/g (ref 0.0–30.0)
Microalb, Ur: 1.1 mg/dL (ref 0.0–1.9)

## 2020-10-12 LAB — BASIC METABOLIC PANEL WITH GFR
BUN: 12 mg/dL (ref 6–23)
CO2: 30 meq/L (ref 19–32)
Calcium: 9.7 mg/dL (ref 8.4–10.5)
Chloride: 104 meq/L (ref 96–112)
Creatinine, Ser: 0.68 mg/dL (ref 0.40–1.20)
GFR: 85.68 mL/min
Glucose, Bld: 92 mg/dL (ref 70–99)
Potassium: 5.1 meq/L (ref 3.5–5.1)
Sodium: 139 meq/L (ref 135–145)

## 2020-10-12 LAB — CBC WITH DIFFERENTIAL/PLATELET
Basophils Absolute: 0 10*3/uL (ref 0.0–0.1)
Basophils Relative: 0.4 % (ref 0.0–3.0)
Eosinophils Absolute: 0.1 10*3/uL (ref 0.0–0.7)
Eosinophils Relative: 1.7 % (ref 0.0–5.0)
HCT: 38.8 % (ref 36.0–46.0)
Hemoglobin: 12.9 g/dL (ref 12.0–15.0)
Lymphocytes Relative: 33.4 % (ref 12.0–46.0)
Lymphs Abs: 1.4 10*3/uL (ref 0.7–4.0)
MCHC: 33.3 g/dL (ref 30.0–36.0)
MCV: 94.5 fl (ref 78.0–100.0)
Monocytes Absolute: 0.4 10*3/uL (ref 0.1–1.0)
Monocytes Relative: 9.9 % (ref 3.0–12.0)
Neutro Abs: 2.4 10*3/uL (ref 1.4–7.7)
Neutrophils Relative %: 54.6 % (ref 43.0–77.0)
Platelets: 289 10*3/uL (ref 150.0–400.0)
RBC: 4.1 Mil/uL (ref 3.87–5.11)
RDW: 13.3 % (ref 11.5–15.5)
WBC: 4.3 10*3/uL (ref 4.0–10.5)

## 2020-10-12 LAB — HEPATIC FUNCTION PANEL
ALT: 15 U/L (ref 0–35)
AST: 14 U/L (ref 0–37)
Albumin: 4.5 g/dL (ref 3.5–5.2)
Alkaline Phosphatase: 63 U/L (ref 39–117)
Bilirubin, Direct: 0.2 mg/dL (ref 0.0–0.3)
Total Bilirubin: 1.2 mg/dL (ref 0.2–1.2)
Total Protein: 6.8 g/dL (ref 6.0–8.3)

## 2020-10-12 LAB — HEMOGLOBIN A1C: Hgb A1c MFr Bld: 6.2 % (ref 4.6–6.5)

## 2020-10-12 LAB — TSH: TSH: 1.61 u[IU]/mL (ref 0.35–4.50)

## 2020-11-08 ENCOUNTER — Ambulatory Visit: Payer: Medicare Other

## 2020-11-21 DIAGNOSIS — G894 Chronic pain syndrome: Secondary | ICD-10-CM | POA: Diagnosis not present

## 2020-11-21 DIAGNOSIS — M4722 Other spondylosis with radiculopathy, cervical region: Secondary | ICD-10-CM | POA: Diagnosis not present

## 2020-11-21 DIAGNOSIS — M5416 Radiculopathy, lumbar region: Secondary | ICD-10-CM | POA: Diagnosis not present

## 2020-12-09 ENCOUNTER — Ambulatory Visit (INDEPENDENT_AMBULATORY_CARE_PROVIDER_SITE_OTHER): Payer: Medicare Other

## 2020-12-09 ENCOUNTER — Ambulatory Visit (INDEPENDENT_AMBULATORY_CARE_PROVIDER_SITE_OTHER): Payer: Medicare Other | Admitting: Internal Medicine

## 2020-12-09 ENCOUNTER — Other Ambulatory Visit: Payer: Self-pay

## 2020-12-09 ENCOUNTER — Encounter: Payer: Self-pay | Admitting: Internal Medicine

## 2020-12-09 VITALS — BP 122/66 | HR 79 | Temp 98.0°F | Ht 64.02 in | Wt 164.0 lb

## 2020-12-09 DIAGNOSIS — F439 Reaction to severe stress, unspecified: Secondary | ICD-10-CM

## 2020-12-09 DIAGNOSIS — R059 Cough, unspecified: Secondary | ICD-10-CM

## 2020-12-09 DIAGNOSIS — K219 Gastro-esophageal reflux disease without esophagitis: Secondary | ICD-10-CM

## 2020-12-09 DIAGNOSIS — E1165 Type 2 diabetes mellitus with hyperglycemia: Secondary | ICD-10-CM | POA: Diagnosis not present

## 2020-12-09 DIAGNOSIS — Z853 Personal history of malignant neoplasm of breast: Secondary | ICD-10-CM | POA: Diagnosis not present

## 2020-12-09 DIAGNOSIS — R051 Acute cough: Secondary | ICD-10-CM | POA: Insufficient documentation

## 2020-12-09 DIAGNOSIS — R062 Wheezing: Secondary | ICD-10-CM | POA: Diagnosis not present

## 2020-12-09 DIAGNOSIS — D649 Anemia, unspecified: Secondary | ICD-10-CM | POA: Diagnosis not present

## 2020-12-09 DIAGNOSIS — E7800 Pure hypercholesterolemia, unspecified: Secondary | ICD-10-CM

## 2020-12-09 DIAGNOSIS — E538 Deficiency of other specified B group vitamins: Secondary | ICD-10-CM

## 2020-12-09 DIAGNOSIS — E041 Nontoxic single thyroid nodule: Secondary | ICD-10-CM | POA: Diagnosis not present

## 2020-12-09 MED ORDER — CYANOCOBALAMIN 1000 MCG/ML IJ SOLN
1000.0000 ug | Freq: Once | INTRAMUSCULAR | Status: AC
Start: 2020-12-09 — End: 2020-12-09
  Administered 2020-12-09: 1000 ug via INTRAMUSCULAR

## 2020-12-09 MED ORDER — ACYCLOVIR 400 MG PO TABS: ORAL_TABLET | ORAL | 3 refills | Status: DC

## 2020-12-09 MED ORDER — ALBUTEROL SULFATE HFA 108 (90 BASE) MCG/ACT IN AERS: 2.0000 | INHALATION_SPRAY | Freq: Four times a day (QID) | RESPIRATORY_TRACT | 0 refills | Status: DC | PRN

## 2020-12-09 NOTE — Progress Notes (Signed)
Patient ID: WAVA KILDOW, female   DOB: [birth year], 75 y.o.   MRN: [id number]   Subjective:    Patient ID: EMMOGENE SIMSON, female    DOB: [birth year], 75 y.o.   MRN: [id number]  HPI This visit occurred during the SARS-CoV-2 public health emergency.  Safety protocols were in place, including screening questions prior to the visit, additional usage of staff PPE, and extensive cleaning of exam room while observing appropriate contact time as indicated for disinfecting solutions.  Patient here for a scheduled follow up.  Here to follow up regarding her acid reflux,  blood sugars, blood pressure and cholesterol.  She reports she is doing relatively well.  Started on aciphex last visit.  Acid reflux is better.  No nausea.  No significant acid reflux.  Still reports feeling something in her throat - congestion.  Some wheezing - in her throat.  Some cough, but reports no cough at night.  No sinus pressure.  No headache.  No chest pain or sob reported.  No abdominal pain or bowel change reported.    Past Medical History:  Diagnosis Date  . Anxiety and depression   . Colon polyps   . Depression   . Diabetes mellitus ([place])   . Endometriosis    requiring hysterectomy  . History of chicken pox   . Hypercholesterolemia   . Hypertension   . Nephrolithiasis   . Pericarditis    recurrent, unkown origin  . Tachycardia    Past Surgical History:  Procedure Laterality Date  . ABDOMINAL HYSTERECTOMY  1980  . APPENDECTOMY  1055  . BACK SURGERY  1005-2010   laminectomy  . CHOLECYSTECTOMY     open  . OOPHORECTOMY  1982  . TONSILLECTOMY     Family History  Problem Relation Age of Onset  . Heart disease Father        myocardial infarction - died 67  . Thyroid disease Mother   . Transient ischemic attack Mother        multiple  . Breast cancer Mother   . Hyperlipidemia Mother   . Kidney disease Mother   . Diabetes Mother   . Rheumatic fever Sister        mitral valve problems  . Breast  cancer Paternal Aunt   . Other Sister        Small vessel disease  . Colon cancer Neg Hx    Social History   Socioeconomic History  . Marital status: Married    Spouse name: Not on file  . Number of children: 3  . Years of education: Not on file  . Highest education level: Not on file  Occupational History  . Not on file  Tobacco Use  . Smoking status: Never Smoker  . Smokeless tobacco: Never Used  Vaping Use  . Vaping Use: Never used  Substance and Sexual Activity  . Alcohol use: No    Alcohol/week: 0.0 standard drinks  . Drug use: No  . Sexual activity: Never  Other Topics Concern  . Not on file  Social History Narrative  . Not on file   Social Determinants of Health   Financial Resource Strain: Not on file  Food Insecurity: Not on file  Transportation Needs: Not on file  Physical Activity: Not on file  Stress: Not on file  Social Connections: Not on file    Outpatient Encounter Medications as of 12/09/2020  Medication Sig  . albuterol (VENTOLIN HFA) 108 (90 Base) MCG/ACT inhaler Inhale 2  puffs into the lungs every 6 (six) hours as needed for wheezing or shortness of breath.  Marland Kitchen aspirin 81 MG EC tablet Take by mouth.  Marland Kitchen atorvastatin (LIPITOR) 40 MG tablet TAKE 1 TABLET BY MOUTH  DAILY  . Calcium Carbonate-Vitamin D 600-400 MG-UNIT tablet Take by mouth.  . citalopram (CELEXA) 40 MG tablet TAKE 1 TABLET BY MOUTH  DAILY  . clobetasol ointment (TEMOVATE) 0.05 % Apply pea size amount daily x 4 weeks, then every other day x 4 weeks, then 2-3 times weekly for maintenance  . ferrous sulfate 325 (65 FE) MG EC tablet Take 325 mg by mouth daily with breakfast.   . gabapentin (NEURONTIN) 600 MG tablet TAKE 2 TABLETS BY MOUTH IN  THE MORNING AND 3 TABLETS  BY MOUTH IN THE EVENING (Patient taking differently: Take 600 mg by mouth 2 (two) times daily.)  . glucose blood (ONE TOUCH ULTRA TEST) test strip TEST BLOOD SUGAR TWICE A DAY  . HYDROcodone-acetaminophen (NORCO) 10-325 MG  tablet TAKE 1 TABLET BY MOUTH THREE TIMES DAILY AS NEEDED FOR CHRONIC PAIN  . latanoprost (XALATAN) 0.005 % ophthalmic solution INSTILL 1 DROP INTO THE  LEFT EYE AT BEDTIME  (REFRIGERATE UNTIL FIRST  OPENED FOR USE) (Patient taking differently: Place 1 drop into both eyes.)  . Melatonin 5 MG CAPS Take by mouth.  . metFORMIN (GLUCOPHAGE) 500 MG tablet TAKE 2 TABLETS BY MOUTH IN  THE MORNING AND TAKE 1  TABLET AT SUPPER (Patient taking differently: Take 500 mg by mouth 2 (two) times daily with a meal.)  . RABEprazole (ACIPHEX) 20 MG tablet TAKE 1 TABLET(20 MG) BY MOUTH TWICE DAILY  . [DISCONTINUED] acyclovir (ZOVIRAX) 400 MG tablet TAKE 1 TABLET BY MOUTH  DAILY  . acyclovir (ZOVIRAX) 400 MG tablet TAKE 1 TABLET BY MOUTH  DAILY  . [EXPIRED] cyanocobalamin ((VITAMIN B-12)) injection 1,000 mcg    No facility-administered encounter medications on file as of 12/09/2020.    Review of Systems  Constitutional: Negative for appetite change and unexpected weight change.  HENT: Negative for sinus pressure and sore throat.        Feels like congestion in her throat.   Respiratory: Positive for cough and wheezing. Negative for chest tightness and shortness of breath.   Cardiovascular: Negative for chest pain, palpitations and leg swelling.  Gastrointestinal: Negative for diarrhea, nausea and vomiting.  Genitourinary: Negative for difficulty urinating and dysuria.  Musculoskeletal: Negative for joint swelling and myalgias.  Skin: Negative for color change and rash.  Neurological: Negative for dizziness, light-headedness and headaches.  Psychiatric/Behavioral: Negative for agitation and dysphoric mood.       Objective:    Physical Exam Vitals reviewed.  Constitutional:      General: She is not in acute distress.    Appearance: Normal appearance.  HENT:     Head: Normocephalic and atraumatic.     Right Ear: External ear normal.     Left Ear: External ear normal.  Eyes:     General: No scleral  icterus.       Right eye: No discharge.        Left eye: No discharge.     Conjunctiva/sclera: Conjunctivae normal.  Neck:     Thyroid: No thyromegaly.  Cardiovascular:     Rate and Rhythm: Normal rate and regular rhythm.  Pulmonary:     Effort: No respiratory distress.     Breath sounds: Normal breath sounds. No wheezing.  Abdominal:     General: Bowel sounds  are normal.     Palpations: Abdomen is soft.     Tenderness: There is no abdominal tenderness.  Musculoskeletal:        General: No swelling or tenderness.     Cervical back: Neck supple. No tenderness.  Lymphadenopathy:     Cervical: No cervical adenopathy.  Skin:    Findings: No erythema or rash.  Neurological:     Mental Status: She is alert.  Psychiatric:        Mood and Affect: Mood normal.        Behavior: Behavior normal.     BP 122/66   Pulse 79   Temp 98 F (36.7 C) (Oral)   Ht 5' 4.02" (1.626 m)   Wt 164 lb (74.4 kg)   SpO2 97%   BMI 28.13 kg/m  Wt Readings from Last 3 Encounters:  12/09/20 164 lb (74.4 kg)  09/30/20 162 lb 6.4 oz (73.7 kg)  08/03/20 161 lb (73 kg)     Lab Results  Component Value Date   WBC 4.3 10/12/2020   HGB 12.9 10/12/2020   HCT 38.8 10/12/2020   PLT 289.0 10/12/2020   GLUCOSE 92 10/12/2020   CHOL 141 10/12/2020   TRIG 86.0 10/12/2020   HDL 54.60 10/12/2020   LDLCALC 69 10/12/2020   ALT 15 10/12/2020   AST 14 10/12/2020   NA 139 10/12/2020   K 5.1 10/12/2020   CL 104 10/12/2020   CREATININE 0.68 10/12/2020   BUN 12 10/12/2020   CO2 30 10/12/2020   TSH 1.61 10/12/2020   HGBA1C 6.2 10/12/2020   MICROALBUR 1.1 10/12/2020       Assessment & Plan:   Problem List Items Addressed This Visit    Anemia    Follow cbc.  Colonoscopy 2018.  Recommended f/u in 5 years.       B12 deficiency - Primary   Cough    Persistent.  Continue aciphex.  Better controlled.  Continue steroid nasal spray.  Start antihistamine.  Albuterol inhaler if needed.  Check cxr.  Discussed  pulmonary referral.  Wants to hold at this time.  Follow.       Relevant Orders   DG Chest 2 View   Diabetes mellitus ([place])    Low carb diet and exercise.  On metformin.  Follow met b and a1c.       Relevant Orders   Hemoglobin J6R   Basic metabolic panel   GERD (gastroesophageal reflux disease)    Recently with increased acid reflux.  On aciphex now taking twice a day.  Improved significantly.  Continue bid aciphex.  Follow.       History of breast cancer    Followed by oncology.       Hypercholesterolemia    Continue lipitor.  Low cholesterol diet and exercise.  Follow lipid panel and liver function tests.       Relevant Orders   Hepatic function panel   Lipid panel   Stress    Continue citalopram.  Overall appears to be handling things well.  Follow.       Thyroid nodule    S/p previous biopsy.  Follow tsh.       Wheezing    Describes the wheezing - localized more to her throat.  No chest tightness.  No increased sob.  Some persistent intermittent cough.  Continue treatment with aciphex to control acid reflux.  Trial of antihistamine.  Steroid nasal spray.  Albuterol inhaler if needed.  Follow.  Discussed referral to pulmonary for further evaluation and question of need for PFTs, etc.  She declines at this time.  Wants to try above.  Will notify me if changes her mind.  Check cxr.           Einar Pheasant, MD

## 2020-12-10 ENCOUNTER — Encounter: Payer: Self-pay | Admitting: Internal Medicine

## 2020-12-10 NOTE — Assessment & Plan Note (Signed)
S/p previous biopsy.  Follow tsh.  

## 2020-12-10 NOTE — Assessment & Plan Note (Signed)
Continue lipitor.  Low cholesterol diet and exercise.  Follow lipid panel and liver function tests.   

## 2020-12-10 NOTE — Assessment & Plan Note (Signed)
Persistent.  Continue aciphex.  Better controlled.  Continue steroid nasal spray.  Start antihistamine.  Albuterol inhaler if needed.  Check cxr.  Discussed pulmonary referral.  Wants to hold at this time.  Follow.

## 2020-12-10 NOTE — Assessment & Plan Note (Signed)
Follow cbc.  Colonoscopy 2018.  Recommended f/u in 5 years.

## 2020-12-10 NOTE — Assessment & Plan Note (Signed)
Recently with increased acid reflux.  On aciphex now taking twice a day.  Improved significantly.  Continue bid aciphex.  Follow.

## 2020-12-10 NOTE — Assessment & Plan Note (Signed)
Describes the wheezing - localized more to her throat.  No chest tightness.  No increased sob.  Some persistent intermittent cough.  Continue treatment with aciphex to control acid reflux.  Trial of antihistamine.  Steroid nasal spray.  Albuterol inhaler if needed.  Follow.  Discussed referral to pulmonary for further evaluation and question of need for PFTs, etc.  She declines at this time.  Wants to try above.  Will notify me if changes her mind.  Check cxr.

## 2020-12-10 NOTE — Assessment & Plan Note (Signed)
Low carb diet and exercise.  On metformin.  Follow met b and a1c.  

## 2020-12-10 NOTE — Assessment & Plan Note (Signed)
Continue citalopram.  Overall appears to be handling things well.  Follow.   

## 2020-12-10 NOTE — Assessment & Plan Note (Signed)
Followed by oncology 

## 2020-12-19 DIAGNOSIS — M5412 Radiculopathy, cervical region: Secondary | ICD-10-CM | POA: Diagnosis not present

## 2020-12-19 DIAGNOSIS — Z7722 Contact with and (suspected) exposure to environmental tobacco smoke (acute) (chronic): Secondary | ICD-10-CM | POA: Diagnosis not present

## 2020-12-19 DIAGNOSIS — G894 Chronic pain syndrome: Secondary | ICD-10-CM | POA: Diagnosis not present

## 2020-12-19 DIAGNOSIS — M5136 Other intervertebral disc degeneration, lumbar region: Secondary | ICD-10-CM | POA: Diagnosis not present

## 2020-12-19 DIAGNOSIS — M7918 Myalgia, other site: Secondary | ICD-10-CM | POA: Diagnosis not present

## 2020-12-19 DIAGNOSIS — M4722 Other spondylosis with radiculopathy, cervical region: Secondary | ICD-10-CM | POA: Diagnosis not present

## 2020-12-19 DIAGNOSIS — M5416 Radiculopathy, lumbar region: Secondary | ICD-10-CM | POA: Diagnosis not present

## 2020-12-19 DIAGNOSIS — M4802 Spinal stenosis, cervical region: Secondary | ICD-10-CM | POA: Diagnosis not present

## 2020-12-19 DIAGNOSIS — M5030 Other cervical disc degeneration, unspecified cervical region: Secondary | ICD-10-CM | POA: Diagnosis not present

## 2021-01-10 ENCOUNTER — Ambulatory Visit: Payer: Medicare Other

## 2021-01-15 ENCOUNTER — Telehealth: Payer: Self-pay | Admitting: Internal Medicine

## 2021-01-15 NOTE — Telephone Encounter (Signed)
-----   Message from Herbert Pun, MD sent at 11/10/2020  7:57 AM EST ----- Good morning!  Just wanted to let you know that I have tried to see this patient couple of time and she has not been able to come. Not sure if she is interested in being seen. Last appointment she called reporting that she was having a sore throat and that she was going to call us to reschedule when she feels better. Just in case you see her again and I have not seen her yet. Will be happy to see her at her convenience.  Sincerely,  Dr. Peyton Najjar

## 2021-01-18 DIAGNOSIS — M5416 Radiculopathy, lumbar region: Secondary | ICD-10-CM | POA: Diagnosis not present

## 2021-01-18 DIAGNOSIS — M4722 Other spondylosis with radiculopathy, cervical region: Secondary | ICD-10-CM | POA: Diagnosis not present

## 2021-01-18 DIAGNOSIS — G894 Chronic pain syndrome: Secondary | ICD-10-CM | POA: Diagnosis not present

## 2021-01-19 DIAGNOSIS — E119 Type 2 diabetes mellitus without complications: Secondary | ICD-10-CM | POA: Diagnosis not present

## 2021-01-19 DIAGNOSIS — H4422 Degenerative myopia, left eye: Secondary | ICD-10-CM | POA: Diagnosis not present

## 2021-01-19 DIAGNOSIS — H35372 Puckering of macula, left eye: Secondary | ICD-10-CM | POA: Diagnosis not present

## 2021-01-19 DIAGNOSIS — H35052 Retinal neovascularization, unspecified, left eye: Secondary | ICD-10-CM | POA: Diagnosis not present

## 2021-01-19 DIAGNOSIS — H43813 Vitreous degeneration, bilateral: Secondary | ICD-10-CM | POA: Diagnosis not present

## 2021-01-19 DIAGNOSIS — H3322 Serous retinal detachment, left eye: Secondary | ICD-10-CM | POA: Diagnosis not present

## 2021-02-08 ENCOUNTER — Telehealth: Payer: Self-pay | Admitting: Internal Medicine

## 2021-02-08 ENCOUNTER — Other Ambulatory Visit: Payer: Self-pay | Admitting: Internal Medicine

## 2021-02-08 ENCOUNTER — Ambulatory Visit (INDEPENDENT_AMBULATORY_CARE_PROVIDER_SITE_OTHER): Payer: Medicare Other | Admitting: Internal Medicine

## 2021-02-08 ENCOUNTER — Other Ambulatory Visit: Payer: Self-pay

## 2021-02-08 ENCOUNTER — Encounter: Payer: Self-pay | Admitting: Internal Medicine

## 2021-02-08 ENCOUNTER — Other Ambulatory Visit (INDEPENDENT_AMBULATORY_CARE_PROVIDER_SITE_OTHER): Payer: Medicare Other

## 2021-02-08 DIAGNOSIS — F439 Reaction to severe stress, unspecified: Secondary | ICD-10-CM | POA: Diagnosis not present

## 2021-02-08 DIAGNOSIS — E7800 Pure hypercholesterolemia, unspecified: Secondary | ICD-10-CM

## 2021-02-08 DIAGNOSIS — K219 Gastro-esophageal reflux disease without esophagitis: Secondary | ICD-10-CM | POA: Diagnosis not present

## 2021-02-08 DIAGNOSIS — I10 Essential (primary) hypertension: Secondary | ICD-10-CM | POA: Diagnosis not present

## 2021-02-08 DIAGNOSIS — E1165 Type 2 diabetes mellitus with hyperglycemia: Secondary | ICD-10-CM

## 2021-02-08 DIAGNOSIS — R002 Palpitations: Secondary | ICD-10-CM | POA: Diagnosis not present

## 2021-02-08 DIAGNOSIS — M7989 Other specified soft tissue disorders: Secondary | ICD-10-CM | POA: Diagnosis not present

## 2021-02-08 DIAGNOSIS — R0602 Shortness of breath: Secondary | ICD-10-CM | POA: Diagnosis not present

## 2021-02-08 DIAGNOSIS — R911 Solitary pulmonary nodule: Secondary | ICD-10-CM | POA: Diagnosis not present

## 2021-02-08 LAB — LIPID PANEL
Cholesterol: 119 mg/dL (ref 0–200)
HDL: 43.9 mg/dL (ref >39.00)
LDL Cholesterol: 59 mg/dL (ref 0–99)
NonHDL: 75.1
Total CHOL/HDL Ratio: 3
Triglycerides: 80 mg/dL (ref 0.0–149.0)
VLDL: 16 mg/dL (ref 0.0–40.0)

## 2021-02-08 LAB — BASIC METABOLIC PANEL
BUN: 9 mg/dL (ref 6–23)
CO2: 29 mEq/L (ref 19–32)
Calcium: 8.8 mg/dL (ref 8.4–10.5)
Chloride: 106 mEq/L (ref 96–112)
Creatinine, Ser: 0.65 mg/dL (ref 0.40–1.20)
GFR: 86.42 mL/min (ref >60.00)
Glucose, Bld: 86 mg/dL (ref 70–99)
Potassium: 4.8 mEq/L (ref 3.5–5.1)
Sodium: 143 mEq/L (ref 135–145)

## 2021-02-08 LAB — HEPATIC FUNCTION PANEL
ALT: 13 U/L (ref 0–35)
AST: 11 U/L (ref 0–37)
Albumin: 4 g/dL (ref 3.5–5.2)
Alkaline Phosphatase: 59 U/L (ref 39–117)
Bilirubin, Direct: 0.2 mg/dL (ref 0.0–0.3)
Total Bilirubin: 0.9 mg/dL (ref 0.2–1.2)
Total Protein: 6.1 g/dL (ref 6.0–8.3)

## 2021-02-08 LAB — HEMOGLOBIN A1C: Hgb A1c MFr Bld: 6.4 % (ref 4.6–6.5)

## 2021-02-08 MED ORDER — FUROSEMIDE 20 MG PO TABS: 20.0000 mg | ORAL_TABLET | Freq: Every day | ORAL | 0 refills | Status: DC | PRN

## 2021-02-08 NOTE — Telephone Encounter (Signed)
Error

## 2021-02-08 NOTE — Assessment & Plan Note (Signed)
Continues on citalopram.  Follow.  

## 2021-02-08 NOTE — Assessment & Plan Note (Signed)
Noticed palpitations as outlined - last night.  None today.  EKG as outlined.  Given palpitations, increased weight and increased sob with lower extremity selling, feel further cardiology w/up warranted as outlined.

## 2021-02-08 NOTE — Progress Notes (Signed)
Patient ID: Sherry Simon, female   DOB: 1945-12-26, 75 y.o.   MRN: [id number]   Subjective:    Patient ID: Sherry Simon, female    DOB: 14-Oct-1945, 75 y.o.   MRN: [id number]  HPI This visit occurred during the SARS-CoV-2 public health emergency.  Safety protocols were in place, including screening questions prior to the visit, additional usage of staff PPE, and extensive cleaning of exam room while observing appropriate contact time as indicated for disinfecting solutions.  Patient here for work in appt. Work in for [employer] and increased lower extremity swelling and weight gain.  States starting Monday 5//11/22.  Noticed increased swelling in her feet.  Worse at the end of the day.  Also this week has noticed being more sob.  Noticed more sob with activity, getting dressed.  States last night noticed increased palpitations.  Watching TV.  No increased palpitations.  Lasted for approximately one hour before she went and got in bed.  No chest pain.  Has noticed increased weight gain - normal weight is 157 - 160 pounds.  Yesterday 166 pounds.  Today at home 164 pounds.  No increased salt intake or change in activity or medication.  No increased cough or congestion.  Bowels moving.  States two weeks ago, she had a "stomach bug".    Past Medical History:  Diagnosis Date  . Anxiety and depression   . Colon polyps   . Depression   . Diabetes mellitus ([place])   . Endometriosis    requiring hysterectomy  . History of chicken pox   . Hypercholesterolemia   . Hypertension   . Nephrolithiasis   . Pericarditis    recurrent, unkown origin  . Tachycardia    Past Surgical History:  Procedure Laterality Date  . ABDOMINAL HYSTERECTOMY  1980  . APPENDECTOMY  1055  . BACK SURGERY  1005-2010   laminectomy  . CHOLECYSTECTOMY     open  . OOPHORECTOMY  1982  . TONSILLECTOMY     Family History  Problem Relation Age of Onset  . Heart disease Father        myocardial infarction - died 36  . Thyroid  disease Mother   . Transient ischemic attack Mother        multiple  . Breast cancer Mother   . Hyperlipidemia Mother   . Kidney disease Mother   . Diabetes Mother   . Rheumatic fever Sister        mitral valve problems  . Breast cancer Paternal Aunt   . Other Sister        Small vessel disease  . Colon cancer Neg Hx    Social History   Socioeconomic History  . Marital status: Married    Spouse name: Not on file  . Number of children: 3  . Years of education: Not on file  . Highest education level: Not on file  Occupational History  . Not on file  Tobacco Use  . Smoking status: Never Smoker  . Smokeless tobacco: Never Used  Vaping Use  . Vaping Use: Never used  Substance and Sexual Activity  . Alcohol use: No    Alcohol/week: 0.0 standard drinks  . Drug use: No  . Sexual activity: Never  Other Topics Concern  . Not on file  Social History Narrative  . Not on file   Social Determinants of Health   Financial Resource Strain: Not on file  Food Insecurity: Not on file  Transportation Needs: Not on  file  Physical Activity: Not on file  Stress: Not on file  Social Connections: Not on file    Outpatient Encounter Medications as of 02/08/2021  Medication Sig  . [DISCONTINUED] furosemide (LASIX) 20 MG tablet Take 1 tablet (20 mg total) by mouth daily as needed.  Marland Kitchen acyclovir (ZOVIRAX) 400 MG tablet TAKE 1 TABLET BY MOUTH  DAILY  . albuterol (VENTOLIN HFA) 108 (90 Base) MCG/ACT inhaler Inhale 2 puffs into the lungs every 6 (six) hours as needed for wheezing or shortness of breath.  Marland Kitchen aspirin 81 MG EC tablet Take by mouth.  Marland Kitchen atorvastatin (LIPITOR) 40 MG tablet TAKE 1 TABLET BY MOUTH  DAILY  . Calcium Carbonate-Vitamin D 600-400 MG-UNIT tablet Take by mouth.  . citalopram (CELEXA) 40 MG tablet TAKE 1 TABLET BY MOUTH  DAILY  . clobetasol ointment (TEMOVATE) 0.05 % Apply pea size amount daily x 4 weeks, then every other day x 4 weeks, then 2-3 times weekly for maintenance   . ferrous sulfate 325 (65 FE) MG EC tablet Take 325 mg by mouth daily with breakfast.   . gabapentin (NEURONTIN) 600 MG tablet TAKE 2 TABLETS BY MOUTH IN  THE MORNING AND 3 TABLETS  BY MOUTH IN THE EVENING (Patient taking differently: Take 600 mg by mouth 2 (two) times daily.)  . glucose blood (ONE TOUCH ULTRA TEST) test strip TEST BLOOD SUGAR TWICE A DAY  . HYDROcodone-acetaminophen (NORCO) 10-325 MG tablet TAKE 1 TABLET BY MOUTH THREE TIMES DAILY AS NEEDED FOR CHRONIC PAIN  . latanoprost (XALATAN) 0.005 % ophthalmic solution INSTILL 1 DROP INTO THE  LEFT EYE AT BEDTIME  (REFRIGERATE UNTIL FIRST  OPENED FOR USE) (Patient taking differently: Place 1 drop into both eyes.)  . Melatonin 5 MG CAPS Take by mouth.  . metFORMIN (GLUCOPHAGE) 500 MG tablet TAKE 2 TABLETS BY MOUTH IN  THE MORNING AND TAKE 1  TABLET AT SUPPER (Patient taking differently: Take 500 mg by mouth 2 (two) times daily with a meal.)  . RABEprazole (ACIPHEX) 20 MG tablet TAKE 1 TABLET(20 MG) BY MOUTH TWICE DAILY   No facility-administered encounter medications on file as of 02/08/2021.    Review of Systems  Constitutional: Negative for appetite change.       Weight gain as outlined.   HENT: Negative for congestion and sinus pressure.   Respiratory: Positive for shortness of breath. Negative for cough and chest tightness.   Cardiovascular: Positive for palpitations and leg swelling. Negative for chest pain.  Gastrointestinal: Negative for abdominal pain, diarrhea and vomiting.  Genitourinary: Negative for difficulty urinating and dysuria.  Musculoskeletal: Negative for joint swelling and myalgias.  Skin: Negative for color change and rash.  Neurological: Negative for dizziness, light-headedness and headaches.  Psychiatric/Behavioral: Negative for agitation and dysphoric mood.       Objective:    Physical Exam Vitals reviewed.  Constitutional:      General: She is not in acute distress.    Appearance: She is  well-developed.  HENT:     Head: Normocephalic.     Nose: Nose normal.  Neck:     Thyroid: No thyromegaly.  Cardiovascular:     Rate and Rhythm: Normal rate and regular rhythm.  Pulmonary:     Effort: No respiratory distress.     Breath sounds: No wheezing.     Comments: Question of slightly diminished breath sounds right lower lung.  No wheezing.   Abdominal:     General: Bowel sounds are normal.  Palpations: Abdomen is soft.     Tenderness: There is no abdominal tenderness.  Musculoskeletal:        General: No tenderness.     Cervical back: Neck supple.     Right lower leg: Edema present.     Left lower leg: Edema present.  Lymphadenopathy:     Cervical: No cervical adenopathy.  Skin:    Findings: No erythema or rash.  Neurological:     Mental Status: She is alert.  Psychiatric:        Mood and Affect: Mood normal.        Behavior: Behavior normal.     BP 132/82   Pulse 69   Temp 98.1 F (36.7 C)   Resp 18   Wt 166 lb 6.4 oz (75.5 kg)   SpO2 97%   BMI 28.54 kg/m  Wt Readings from Last 3 Encounters:  02/08/21 166 lb 6.4 oz (75.5 kg)  12/09/20 164 lb (74.4 kg)  09/30/20 162 lb 6.4 oz (73.7 kg)     Lab Results  Component Value Date   WBC 4.3 10/12/2020   HGB 12.9 10/12/2020   HCT 38.8 10/12/2020   PLT 289.0 10/12/2020   GLUCOSE 86 02/08/2021   CHOL 119 02/08/2021   TRIG 80.0 02/08/2021   HDL 43.90 02/08/2021   LDLCALC 59 02/08/2021   ALT 13 02/08/2021   AST 11 02/08/2021   NA 143 02/08/2021   K 4.8 02/08/2021   CL 106 02/08/2021   CREATININE 0.65 02/08/2021   BUN 9 02/08/2021   CO2 29 02/08/2021   TSH 1.61 10/12/2020   HGBA1C 6.4 02/08/2021   MICROALBUR 1.1 10/12/2020       Assessment & Plan:   Problem List Items Addressed This Visit    Diabetes mellitus ([place])    Low carb diet and exercise given elevated blood sugar.   Continue metformin.  Follow met b and a1c.   Lab Results  Component Value Date   HGBA1C 6.4 02/08/2021         GERD (gastroesophageal reflux disease)    No acid reflux reported.  On aciphex.  Follow.       Hypercholesterolemia    Continue lipitor.  Check lipid panel and liver function tests.        Hypertension    Blood pressure as outlined.  Recheck improved.  Increased weight and lower extremity swelling as outlined.  Lasix 40m q day x 3 days.  Follow pressures.  Follow weight.  Check metabolic panel today.       Lung nodule    Has been followed by oncology.  F/u chest CT stable.  Recommended no further scanning.  Recheck cxr today given acute symptoms.       Palpitations    Noticed palpitations as outlined - last night.  None today.  EKG as outlined.  Given palpitations, increased weight and increased sob with lower extremity selling, feel further cardiology w/up warranted as outlined.        SOB (shortness of breath)    Increased sob with exertion.  Increased weight and leg swelling - started this week.  EKG - SR with no acute ischemic changes.  Lasix 230mq day x 3 days.  Discussed further cardiac w/up.  She is agreeable.  Check routine labs and cxr.  F/u with cardiology with question of need for echo, monitor, etc.  Pt in agreement.  If any change or worsening symptoms, she is to be evaluated.  Stress    Continues on citalopram.  Follow.       Swelling of lower extremity    Increased swelling as outlined - bilateral lower extremities.  Monitor salt/sodium intake.  Lasix as outlined.  Referral to cardiology as outlined.            Einar Pheasant, MD

## 2021-02-08 NOTE — Patient Instructions (Signed)
Dr Nehemiah Massed - 4:15 on Thursday 02/09/21

## 2021-02-08 NOTE — Assessment & Plan Note (Signed)
Has been followed by oncology.  F/u chest CT stable.  Recommended no further scanning.  Recheck cxr today given acute symptoms.

## 2021-02-08 NOTE — Assessment & Plan Note (Addendum)
Low carb diet and exercise given elevated blood sugar.   Continue metformin.  Follow met b and a1c.   Lab Results  Component Value Date   HGBA1C 6.4 02/08/2021

## 2021-02-08 NOTE — Assessment & Plan Note (Signed)
Continue lipitor.  Check lipid panel and liver function tests.

## 2021-02-08 NOTE — Assessment & Plan Note (Signed)
Increased sob with exertion.  Increased weight and leg swelling - started this week.  EKG - SR with no acute ischemic changes.  Lasix 20mg  q day x 3 days.  Discussed further cardiac w/up.  She is agreeable.  Check routine labs and cxr.  F/u with cardiology with question of need for echo, monitor, etc.  Pt in agreement.  If any change or worsening symptoms, she is to be evaluated.

## 2021-02-08 NOTE — Assessment & Plan Note (Addendum)
Blood pressure as outlined.  Recheck improved.  Increased weight and lower extremity swelling as outlined.  Lasix 20mg  q day x 3 days.  Follow pressures.  Follow weight.  Check metabolic panel today.

## 2021-02-08 NOTE — Assessment & Plan Note (Signed)
No acid reflux reported.  On aciphex.  Follow.

## 2021-02-08 NOTE — Assessment & Plan Note (Signed)
Increased swelling as outlined - bilateral lower extremities.  Monitor salt/sodium intake.  Lasix as outlined.  Referral to cardiology as outlined.

## 2021-02-09 DIAGNOSIS — I25118 Atherosclerotic heart disease of native coronary artery with other forms of angina pectoris: Secondary | ICD-10-CM | POA: Diagnosis not present

## 2021-02-09 DIAGNOSIS — E782 Mixed hyperlipidemia: Secondary | ICD-10-CM | POA: Diagnosis not present

## 2021-02-09 DIAGNOSIS — I10 Essential (primary) hypertension: Secondary | ICD-10-CM | POA: Diagnosis not present

## 2021-02-09 DIAGNOSIS — R002 Palpitations: Secondary | ICD-10-CM | POA: Diagnosis not present

## 2021-02-13 ENCOUNTER — Ambulatory Visit (INDEPENDENT_AMBULATORY_CARE_PROVIDER_SITE_OTHER): Payer: Medicare Other | Admitting: Internal Medicine

## 2021-02-13 ENCOUNTER — Other Ambulatory Visit: Payer: Self-pay

## 2021-02-13 VITALS — BP 110/70 | HR 81 | Temp 97.8°F | Resp 16 | Ht 64.0 in | Wt 162.0 lb

## 2021-02-13 DIAGNOSIS — I10 Essential (primary) hypertension: Secondary | ICD-10-CM

## 2021-02-13 DIAGNOSIS — R0602 Shortness of breath: Secondary | ICD-10-CM

## 2021-02-13 DIAGNOSIS — E7800 Pure hypercholesterolemia, unspecified: Secondary | ICD-10-CM | POA: Diagnosis not present

## 2021-02-13 DIAGNOSIS — R002 Palpitations: Secondary | ICD-10-CM | POA: Diagnosis not present

## 2021-02-13 DIAGNOSIS — E1165 Type 2 diabetes mellitus with hyperglycemia: Secondary | ICD-10-CM | POA: Diagnosis not present

## 2021-02-13 DIAGNOSIS — M7989 Other specified soft tissue disorders: Secondary | ICD-10-CM | POA: Diagnosis not present

## 2021-02-13 DIAGNOSIS — F439 Reaction to severe stress, unspecified: Secondary | ICD-10-CM

## 2021-02-13 DIAGNOSIS — L900 Lichen sclerosus et atrophicus: Secondary | ICD-10-CM | POA: Diagnosis not present

## 2021-02-13 DIAGNOSIS — K219 Gastro-esophageal reflux disease without esophagitis: Secondary | ICD-10-CM | POA: Diagnosis not present

## 2021-02-13 LAB — HM DIABETES FOOT EXAM

## 2021-02-13 MED ORDER — OMEPRAZOLE 40 MG PO CPDR: 40.0000 mg | DELAYED_RELEASE_CAPSULE | Freq: Every day | ORAL | 3 refills | Status: DC

## 2021-02-13 NOTE — Progress Notes (Signed)
Patient ID: Sherry Simon, female   DOB: 1946/08/24, 75 y.o.   MRN: [id number]   Subjective:    Patient ID: Sherry Simon, female    DOB: Jan 13, 1946, 75 y.o.   MRN: [id number]  HPI This visit occurred during the SARS-CoV-2 public health emergency.  Safety protocols were in place, including screening questions prior to the visit, additional usage of staff PPE, and extensive cleaning of exam room while observing appropriate contact time as indicated for disinfecting solutions.  Patient here for a scheduled follow up.  Was worked in last week - presented with increased sob and lower extremity swelling and weight gain.  Was placed on lasix for three days.  Saw cardiology 02/09/21.  holter placed.  ECHO scheduled for 02/22/21 and stress test scheduled for 03/09/21.  Also discussed sleep study.  She is feeling better after lasix.  Breathing is better.  States after the lasix, she lost 5 lbs.  No chest pain.  Eating.  No nausea or vomiting.  Having problems with insurance - covering aciphex.  Discussed trial of another PPI.  Discussed diet and exercise.    Past Medical History:  Diagnosis Date  . Anxiety and depression   . Colon polyps   . Depression   . Diabetes mellitus ([place])   . Endometriosis    requiring hysterectomy  . History of chicken pox   . Hypercholesterolemia   . Hypertension   . Nephrolithiasis   . Pericarditis    recurrent, unkown origin  . Tachycardia    Past Surgical History:  Procedure Laterality Date  . ABDOMINAL HYSTERECTOMY  1980  . APPENDECTOMY  1055  . BACK SURGERY  1005-2010   laminectomy  . CHOLECYSTECTOMY     open  . OOPHORECTOMY  1982  . TONSILLECTOMY     Family History  Problem Relation Age of Onset  . Heart disease Father        myocardial infarction - died 66  . Thyroid disease Mother   . Transient ischemic attack Mother        multiple  . Breast cancer Mother   . Hyperlipidemia Mother   . Kidney disease Mother   . Diabetes Mother   .  Rheumatic fever Sister        mitral valve problems  . Breast cancer Paternal Aunt   . Other Sister        Small vessel disease  . Colon cancer Neg Hx    Social History   Socioeconomic History  . Marital status: Married    Spouse name: Not on file  . Number of children: 3  . Years of education: Not on file  . Highest education level: Not on file  Occupational History  . Not on file  Tobacco Use  . Smoking status: Never Smoker  . Smokeless tobacco: Never Used  Vaping Use  . Vaping Use: Never used  Substance and Sexual Activity  . Alcohol use: No    Alcohol/week: 0.0 standard drinks  . Drug use: No  . Sexual activity: Never  Other Topics Concern  . Not on file  Social History Narrative  . Not on file   Social Determinants of Health   Financial Resource Strain: Not on file  Food Insecurity: Not on file  Transportation Needs: Not on file  Physical Activity: Not on file  Stress: Not on file  Social Connections: Not on file    Outpatient Encounter Medications as of 02/13/2021  Medication Sig  . omeprazole ([place])  40 MG capsule Take 1 capsule (40 mg total) by mouth daily.  Marland Kitchen acyclovir (ZOVIRAX) 400 MG tablet TAKE 1 TABLET BY MOUTH  DAILY  . albuterol (VENTOLIN HFA) 108 (90 Base) MCG/ACT inhaler Inhale 2 puffs into the lungs every 6 (six) hours as needed for wheezing or shortness of breath.  Marland Kitchen aspirin 81 MG EC tablet Take by mouth.  Marland Kitchen atorvastatin (LIPITOR) 40 MG tablet TAKE 1 TABLET BY MOUTH  DAILY  . Calcium Carbonate-Vitamin D 600-400 MG-UNIT tablet Take by mouth.  . citalopram (CELEXA) 40 MG tablet TAKE 1 TABLET BY MOUTH  DAILY  . clobetasol ointment (TEMOVATE) 0.05 % Apply pea size amount daily x 4 weeks, then every other day x 4 weeks, then 2-3 times weekly for maintenance  . ferrous sulfate 325 (65 FE) MG EC tablet Take 325 mg by mouth daily with breakfast.   . furosemide (LASIX) 20 MG tablet TAKE 1 TABLET(20 MG) BY MOUTH DAILY AS NEEDED  . gabapentin  (NEURONTIN) 600 MG tablet TAKE 2 TABLETS BY MOUTH IN  THE MORNING AND 3 TABLETS  BY MOUTH IN THE EVENING (Patient taking differently: Take 600 mg by mouth 2 (two) times daily.)  . glucose blood (ONE TOUCH ULTRA TEST) test strip TEST BLOOD SUGAR TWICE A DAY  . HYDROcodone-acetaminophen (NORCO) 10-325 MG tablet TAKE 1 TABLET BY MOUTH THREE TIMES DAILY AS NEEDED FOR CHRONIC PAIN  . latanoprost (XALATAN) 0.005 % ophthalmic solution INSTILL 1 DROP INTO THE  LEFT EYE AT BEDTIME  (REFRIGERATE UNTIL FIRST  OPENED FOR USE) (Patient taking differently: Place 1 drop into both eyes.)  . Melatonin 5 MG CAPS Take by mouth.  . metFORMIN (GLUCOPHAGE) 500 MG tablet TAKE 2 TABLETS BY MOUTH IN  THE MORNING AND TAKE 1  TABLET AT SUPPER (Patient taking differently: Take 500 mg by mouth 2 (two) times daily with a meal.)  . [DISCONTINUED] RABEprazole (ACIPHEX) 20 MG tablet TAKE 1 TABLET(20 MG) BY MOUTH TWICE DAILY   No facility-administered encounter medications on file as of 02/13/2021.    Review of Systems  Constitutional: Negative for appetite change and unexpected weight change.  HENT: Negative for congestion and sinus pressure.   Respiratory: Negative for cough and chest tightness.        Breathing improved.    Cardiovascular: Negative for chest pain, palpitations and leg swelling.  Gastrointestinal: Negative for abdominal pain, diarrhea, nausea and vomiting.  Genitourinary: Negative for difficulty urinating and dysuria.  Musculoskeletal: Negative for joint swelling and myalgias.  Skin: Negative for color change and rash.  Neurological: Negative for dizziness, light-headedness and headaches.  Psychiatric/Behavioral: Negative for agitation and dysphoric mood.       Objective:    Physical Exam Vitals reviewed.  Constitutional:      General: She is not in acute distress.    Appearance: Normal appearance.  HENT:     Head: Normocephalic and atraumatic.     Right Ear: External ear normal.     Left Ear:  External ear normal.  Eyes:     General: No scleral icterus.       Right eye: No discharge.        Left eye: No discharge.     Conjunctiva/sclera: Conjunctivae normal.  Neck:     Thyroid: No thyromegaly.  Cardiovascular:     Rate and Rhythm: Normal rate and regular rhythm.  Pulmonary:     Effort: No respiratory distress.     Breath sounds: Normal breath sounds. No wheezing.  Abdominal:  General: Bowel sounds are normal.     Palpations: Abdomen is soft.     Tenderness: There is no abdominal tenderness.  Musculoskeletal:        General: No swelling or tenderness.     Cervical back: Neck supple. No tenderness.  Lymphadenopathy:     Cervical: No cervical adenopathy.  Skin:    Findings: No erythema or rash.  Neurological:     Mental Status: She is alert.  Psychiatric:        Mood and Affect: Mood normal.        Behavior: Behavior normal.     BP 110/70   Pulse 81   Temp 97.8 F (36.6 C)   Resp 16   Ht 5' 4" (1.626 m)   Wt 162 lb (73.5 kg)   SpO2 98%   BMI 27.81 kg/m  Wt Readings from Last 3 Encounters:  02/13/21 162 lb (73.5 kg)  02/08/21 166 lb 6.4 oz (75.5 kg)  12/09/20 164 lb (74.4 kg)     Lab Results  Component Value Date   WBC 4.3 10/12/2020   HGB 12.9 10/12/2020   HCT 38.8 10/12/2020   PLT 289.0 10/12/2020   GLUCOSE 86 02/08/2021   CHOL 119 02/08/2021   TRIG 80.0 02/08/2021   HDL 43.90 02/08/2021   LDLCALC 59 02/08/2021   ALT 13 02/08/2021   AST 11 02/08/2021   NA 143 02/08/2021   K 4.8 02/08/2021   CL 106 02/08/2021   CREATININE 0.65 02/08/2021   BUN 9 02/08/2021   CO2 29 02/08/2021   TSH 1.61 10/12/2020   HGBA1C 6.4 02/08/2021   MICROALBUR 1.1 10/12/2020       Assessment & Plan:   Problem List Items Addressed This Visit    Diabetes mellitus ([place])    Low carb diet and exercise given elevated sugars.   Continues on metformin.  Follow met b and a1c.        GERD (gastroesophageal reflux disease)    aciphex works well.  Apparently  insurance not covering.  Check with insurance.  Trial of prilosec.        Relevant Medications   omeprazole (PRILOSEC) 40 MG capsule   Hypercholesterolemia    Continue lipitor.  Low cholesterol diet and exercise.  Follow lipid panel and liver function tests.        Hypertension    Blood pressure as outlined.  Has lasix to take prn.  Discussed if gains more than 3lbs in one day or 5 lbs in one week.  Follow pressures.  Follow metabolic panel.       Lichen sclerosus    Followed by gyn.  Uses clobetasol.       Palpitations    holter returned today.        SOB (shortness of breath)    Breathing better.  Had three days of lasix.  Saw cardiology. Planning for echo, stress test and sleep study.  holter returned today.  F/u on results.       Stress    Continue citalopram.  Appears to be handling things relatively well.        Swelling of lower extremity    Improved.  Had three days of lasix.  Follow.  Breathing better.  Overall feels better.         Other Visit Diagnoses    Shortness of breath    -  Primary   Relevant Orders   DG Chest 2 View  Charlene Scott, MD 

## 2021-02-15 DIAGNOSIS — G894 Chronic pain syndrome: Secondary | ICD-10-CM | POA: Diagnosis not present

## 2021-02-15 DIAGNOSIS — M5416 Radiculopathy, lumbar region: Secondary | ICD-10-CM | POA: Diagnosis not present

## 2021-02-15 DIAGNOSIS — M5136 Other intervertebral disc degeneration, lumbar region: Secondary | ICD-10-CM | POA: Diagnosis not present

## 2021-02-15 DIAGNOSIS — M4722 Other spondylosis with radiculopathy, cervical region: Secondary | ICD-10-CM | POA: Diagnosis not present

## 2021-02-16 ENCOUNTER — Encounter: Payer: Self-pay | Admitting: Internal Medicine

## 2021-02-16 NOTE — Telephone Encounter (Signed)
She does need to check with her insurance or pharmacy to see what is covered and let us know.  Also, regarding the clobetasol, I do recommend gyn following.  I do not mind refilling the clobetasol, but will need her to f/u with gyn in future - just for routine surveillance.

## 2021-02-20 ENCOUNTER — Encounter: Payer: Self-pay | Admitting: Internal Medicine

## 2021-02-20 NOTE — Assessment & Plan Note (Signed)
Blood pressure as outlined.  Has lasix to take prn.  Discussed if gains more than 3lbs in one day or 5 lbs in one week.  Follow pressures.  Follow metabolic panel.

## 2021-02-20 NOTE — Assessment & Plan Note (Signed)
Continue citalopram.  Appears to be handling things relatively well.

## 2021-02-20 NOTE — Assessment & Plan Note (Signed)
Breathing better.  Had three days of lasix.  Saw cardiology. Planning for echo, stress test and sleep study.  holter returned today.  F/u on results.

## 2021-02-20 NOTE — Assessment & Plan Note (Signed)
holter returned today.

## 2021-02-20 NOTE — Assessment & Plan Note (Signed)
aciphex works well.  Apparently insurance not covering.  Check with insurance.  Trial of prilosec.

## 2021-02-20 NOTE — Assessment & Plan Note (Signed)
Continue lipitor.  Low cholesterol diet and exercise.  Follow lipid panel and liver function tests.   

## 2021-02-20 NOTE — Assessment & Plan Note (Signed)
Followed by gyn.  Uses clobetasol.

## 2021-02-20 NOTE — Assessment & Plan Note (Signed)
Improved.  Had three days of lasix.  Follow.  Breathing better.  Overall feels better.

## 2021-02-20 NOTE — Assessment & Plan Note (Addendum)
Low carb diet and exercise given elevated sugars.   Continues on metformin.  Follow met b and a1c.   

## 2021-02-22 DIAGNOSIS — I25118 Atherosclerotic heart disease of native coronary artery with other forms of angina pectoris: Secondary | ICD-10-CM | POA: Diagnosis not present

## 2021-02-24 DIAGNOSIS — R002 Palpitations: Secondary | ICD-10-CM | POA: Diagnosis not present

## 2021-03-01 DIAGNOSIS — H401124 Primary open-angle glaucoma, left eye, indeterminate stage: Secondary | ICD-10-CM | POA: Diagnosis not present

## 2021-03-02 DIAGNOSIS — Z1231 Encounter for screening mammogram for malignant neoplasm of breast: Secondary | ICD-10-CM | POA: Diagnosis not present

## 2021-03-02 DIAGNOSIS — Z923 Personal history of irradiation: Secondary | ICD-10-CM | POA: Diagnosis not present

## 2021-03-02 DIAGNOSIS — Z87898 Personal history of other specified conditions: Secondary | ICD-10-CM | POA: Diagnosis not present

## 2021-03-02 DIAGNOSIS — C50911 Malignant neoplasm of unspecified site of right female breast: Secondary | ICD-10-CM | POA: Diagnosis not present

## 2021-03-02 DIAGNOSIS — N6489 Other specified disorders of breast: Secondary | ICD-10-CM | POA: Diagnosis not present

## 2021-03-02 DIAGNOSIS — N6310 Unspecified lump in the right breast, unspecified quadrant: Secondary | ICD-10-CM | POA: Diagnosis not present

## 2021-03-02 DIAGNOSIS — M810 Age-related osteoporosis without current pathological fracture: Secondary | ICD-10-CM | POA: Diagnosis not present

## 2021-03-02 DIAGNOSIS — Z170 Estrogen receptor positive status [ER+]: Secondary | ICD-10-CM | POA: Diagnosis not present

## 2021-03-08 DIAGNOSIS — M5136 Other intervertebral disc degeneration, lumbar region: Secondary | ICD-10-CM | POA: Diagnosis not present

## 2021-03-08 DIAGNOSIS — M5416 Radiculopathy, lumbar region: Secondary | ICD-10-CM | POA: Diagnosis not present

## 2021-03-08 DIAGNOSIS — G894 Chronic pain syndrome: Secondary | ICD-10-CM | POA: Diagnosis not present

## 2021-03-09 DIAGNOSIS — I25118 Atherosclerotic heart disease of native coronary artery with other forms of angina pectoris: Secondary | ICD-10-CM | POA: Diagnosis not present

## 2021-03-15 DIAGNOSIS — G894 Chronic pain syndrome: Secondary | ICD-10-CM | POA: Diagnosis not present

## 2021-03-15 DIAGNOSIS — M5416 Radiculopathy, lumbar region: Secondary | ICD-10-CM | POA: Diagnosis not present

## 2021-03-15 DIAGNOSIS — M4722 Other spondylosis with radiculopathy, cervical region: Secondary | ICD-10-CM | POA: Diagnosis not present

## 2021-03-16 DIAGNOSIS — E782 Mixed hyperlipidemia: Secondary | ICD-10-CM | POA: Diagnosis not present

## 2021-03-16 DIAGNOSIS — I10 Essential (primary) hypertension: Secondary | ICD-10-CM | POA: Diagnosis not present

## 2021-03-16 DIAGNOSIS — I2510 Atherosclerotic heart disease of native coronary artery without angina pectoris: Secondary | ICD-10-CM | POA: Diagnosis not present

## 2021-03-22 ENCOUNTER — Encounter: Payer: Self-pay | Admitting: Internal Medicine

## 2021-03-22 NOTE — Telephone Encounter (Signed)
Patient was given one time refill 5/27 and was supposed to schedule appt with gyn. Has not seen gyn.

## 2021-03-22 NOTE — Telephone Encounter (Signed)
Ok to refill x 1.  Please notify her she does need to f/u with gyn. If trouble scheduling, let us know so we can get scheduled.

## 2021-03-24 ENCOUNTER — Other Ambulatory Visit: Payer: Self-pay

## 2021-03-24 DIAGNOSIS — R911 Solitary pulmonary nodule: Secondary | ICD-10-CM | POA: Diagnosis not present

## 2021-03-24 DIAGNOSIS — Z87898 Personal history of other specified conditions: Secondary | ICD-10-CM | POA: Diagnosis not present

## 2021-03-24 MED ORDER — CLOBETASOL PROPIONATE 0.05 % EX OINT: TOPICAL_OINTMENT | CUTANEOUS | 0 refills | Status: AC

## 2021-03-24 NOTE — Telephone Encounter (Signed)
Medication refilled. She is going to set up appt with GYN.

## 2021-04-17 DIAGNOSIS — M5416 Radiculopathy, lumbar region: Secondary | ICD-10-CM | POA: Diagnosis not present

## 2021-04-24 ENCOUNTER — Ambulatory Visit: Payer: Medicare Other | Admitting: Internal Medicine

## 2021-05-12 ENCOUNTER — Telehealth: Payer: Self-pay | Admitting: Internal Medicine

## 2021-05-12 NOTE — Telephone Encounter (Signed)
Received message from Catie.  See message.  Need to confirm if taking metformin.  If so, need to know how she is taking.  Has not been refilled.

## 2021-05-16 NOTE — Telephone Encounter (Signed)
Pt stated that she is taking Metformin 500 mg BID. Pt stated that she has not had to call the mail order pharmacy and them stop automatically refilling it because she "has about 20 bottles of metformin". Pt stated that she has had to do that with a lot of her medications because she has so much of all of them so she called them and told them not to send it to her until she calls them.

## 2021-05-17 DIAGNOSIS — M4722 Other spondylosis with radiculopathy, cervical region: Secondary | ICD-10-CM | POA: Diagnosis not present

## 2021-05-17 DIAGNOSIS — M5416 Radiculopathy, lumbar region: Secondary | ICD-10-CM | POA: Diagnosis not present

## 2021-05-17 DIAGNOSIS — G894 Chronic pain syndrome: Secondary | ICD-10-CM | POA: Diagnosis not present

## 2021-06-13 ENCOUNTER — Telehealth: Payer: Self-pay | Admitting: Internal Medicine

## 2021-06-13 ENCOUNTER — Ambulatory Visit (INDEPENDENT_AMBULATORY_CARE_PROVIDER_SITE_OTHER): Payer: Medicare Other | Admitting: Internal Medicine

## 2021-06-13 ENCOUNTER — Other Ambulatory Visit: Payer: Self-pay

## 2021-06-13 VITALS — BP 118/70 | HR 74 | Temp 97.9°F | Resp 16 | Ht 64.0 in | Wt 160.0 lb

## 2021-06-13 DIAGNOSIS — L989 Disorder of the skin and subcutaneous tissue, unspecified: Secondary | ICD-10-CM | POA: Diagnosis not present

## 2021-06-13 DIAGNOSIS — Z23 Encounter for immunization: Secondary | ICD-10-CM | POA: Diagnosis not present

## 2021-06-13 DIAGNOSIS — E1165 Type 2 diabetes mellitus with hyperglycemia: Secondary | ICD-10-CM | POA: Diagnosis not present

## 2021-06-13 DIAGNOSIS — G8929 Other chronic pain: Secondary | ICD-10-CM | POA: Diagnosis not present

## 2021-06-13 DIAGNOSIS — Z853 Personal history of malignant neoplasm of breast: Secondary | ICD-10-CM

## 2021-06-13 DIAGNOSIS — D649 Anemia, unspecified: Secondary | ICD-10-CM

## 2021-06-13 DIAGNOSIS — F439 Reaction to severe stress, unspecified: Secondary | ICD-10-CM

## 2021-06-13 DIAGNOSIS — E7800 Pure hypercholesterolemia, unspecified: Secondary | ICD-10-CM | POA: Diagnosis not present

## 2021-06-13 DIAGNOSIS — M5442 Lumbago with sciatica, left side: Secondary | ICD-10-CM

## 2021-06-13 DIAGNOSIS — R911 Solitary pulmonary nodule: Secondary | ICD-10-CM

## 2021-06-13 DIAGNOSIS — L900 Lichen sclerosus et atrophicus: Secondary | ICD-10-CM

## 2021-06-13 DIAGNOSIS — I10 Essential (primary) hypertension: Secondary | ICD-10-CM

## 2021-06-13 DIAGNOSIS — K219 Gastro-esophageal reflux disease without esophagitis: Secondary | ICD-10-CM | POA: Diagnosis not present

## 2021-06-13 MED ORDER — NYSTATIN 100000 UNIT/GM EX CREA: 1.0000 "application " | TOPICAL_CREAM | Freq: Two times a day (BID) | CUTANEOUS | 0 refills | Status: DC

## 2021-06-13 NOTE — Telephone Encounter (Signed)
Patient scheduled for labs 06/29/21 as requested by check out note.   Needing orders placed.

## 2021-06-13 NOTE — Progress Notes (Signed)
Patient ID: Sherry Simon, female   DOB: 1946-05-27, 75 y.o.   MRN: [id number]   Subjective:    Patient ID: Sherry Simon, female    DOB: 19-Dec-1945, 75 y.o.   MRN: [id number]  This visit occurred during the SARS-CoV-2 public health emergency.  Safety protocols were in place, including screening questions prior to the visit, additional usage of staff PPE, and extensive cleaning of exam room while observing appropriate contact time as indicated for disinfecting solutions.   Patient here for a scheduled follow up.   Chief Complaint  Patient presents with   Diabetes   Hyperlipidemia   .   HPI Has chronic back pain.  S/p L4-5 trasnforaminal injection 04/17/21.  Helped for a brief period. Increased pain - left buttock/leg.  Has neck issus as well.  Arms - tingling.  Saw neurosurgery.  Planning for MRI Friday.  Prescribed hydrocodone to take prn.  This limits her activity.  Saw cardiology 03/16/21 - holter occasional PVCs, PACs, SVT, ST and asymptomatic bradycardia.  Normal stress test.  ECHO - EF 55%, mild TR. No changes made.  Recommended f/u in one year.  No chest pain reported.  Breathing appears to be stable.  No nausea or vomiting reported.  Has a circular lesion - left breast - 6:00.  Also nailbed indented with raised soft tissue surrounding.  No known injury.    Past Medical History:  Diagnosis Date   Anxiety and depression    Colon polyps    Depression    Diabetes mellitus ([place])    Endometriosis    requiring hysterectomy   History of chicken pox    Hypercholesterolemia    Hypertension    Nephrolithiasis    Pericarditis    recurrent, unkown origin   Tachycardia    Past Surgical History:  Procedure Laterality Date   ABDOMINAL HYSTERECTOMY  1980   APPENDECTOMY  74   BACK SURGERY  1005-2010   laminectomy   CHOLECYSTECTOMY     open   OOPHORECTOMY  1982   TONSILLECTOMY     Family History  Problem Relation Age of Onset   Heart disease Father        myocardial  infarction - died 68   Thyroid disease Mother    Transient ischemic attack Mother        multiple   Breast cancer Mother    Hyperlipidemia Mother    Kidney disease Mother    Diabetes Mother    Rheumatic fever Sister        mitral valve problems   Breast cancer Paternal Aunt    Other Sister        Small vessel disease   Colon cancer Neg Hx    Social History   Socioeconomic History   Marital status: Married    Spouse name: Not on file   Number of children: 3   Years of education: Not on file   Highest education level: Not on file  Occupational History   Not on file  Tobacco Use   Smoking status: Never   Smokeless tobacco: Never  Vaping Use   Vaping Use: Never used  Substance and Sexual Activity   Alcohol use: No    Alcohol/week: 0.0 standard drinks   Drug use: No   Sexual activity: Never  Other Topics Concern   Not on file  Social History Narrative   Not on file   Social Determinants of Health   Financial Resource Strain: Not on file  Food  Insecurity: Not on file  Transportation Needs: Not on file  Physical Activity: Not on file  Stress: Not on file  Social Connections: Not on file     Review of Systems  Constitutional:  Negative for appetite change and unexpected weight change.  HENT:  Negative for congestion and sinus pressure.   Respiratory:  Negative for cough and chest tightness.        Breathing stable.   Cardiovascular:  Negative for chest pain, palpitations and leg swelling.  Gastrointestinal:  Negative for abdominal pain, diarrhea, nausea and vomiting.  Genitourinary:  Negative for difficulty urinating and dysuria.  Musculoskeletal:  Positive for back pain. Negative for joint swelling and myalgias.  Skin:  Negative for color change.       Circular lesion - left breast.   Neurological:  Negative for dizziness, light-headedness and headaches.  Psychiatric/Behavioral:  Negative for agitation and dysphoric mood.       Objective:     BP 118/70    Pulse 74   Temp 97.9 F (36.6 C)   Resp 16   Ht '5\' 4"'  (1.626 m)   Wt 160 lb (72.6 kg)   SpO2 97%   BMI 27.46 kg/m  Wt Readings from Last 3 Encounters:  06/13/21 160 lb (72.6 kg)  02/13/21 162 lb (73.5 kg)  02/08/21 166 lb 6.4 oz (75.5 kg)    Physical Exam Vitals reviewed.  Constitutional:      General: She is not in acute distress.    Appearance: Normal appearance.  HENT:     Head: Normocephalic and atraumatic.     Right Ear: External ear normal.     Left Ear: External ear normal.  Eyes:     General: No scleral icterus.       Right eye: No discharge.        Left eye: No discharge.     Conjunctiva/sclera: Conjunctivae normal.  Neck:     Thyroid: No thyromegaly.  Cardiovascular:     Rate and Rhythm: Normal rate and regular rhythm.  Pulmonary:     Effort: No respiratory distress.     Breath sounds: Normal breath sounds. No wheezing.  Abdominal:     General: Bowel sounds are normal.     Palpations: Abdomen is soft.     Tenderness: There is no abdominal tenderness.  Musculoskeletal:        General: No swelling or tenderness.     Cervical back: Neck supple. No tenderness.  Lymphadenopathy:     Cervical: No cervical adenopathy.  Skin:    Findings: No erythema.     Comments: Circular lesion left breast - 6:00.  Nailbed - indented with raised soft tissue surrounding.   Neurological:     Mental Status: She is alert.  Psychiatric:        Mood and Affect: Mood normal.        Behavior: Behavior normal.     Outpatient Encounter Medications as of 06/13/2021  Medication Sig   nystatin cream (MYCOSTATIN) Apply 1 application topically 2 (two) times daily.   acyclovir (ZOVIRAX) 400 MG tablet TAKE 1 TABLET BY MOUTH  DAILY   albuterol (VENTOLIN HFA) 108 (90 Base) MCG/ACT inhaler Inhale 2 puffs into the lungs every 6 (six) hours as needed for wheezing or shortness of breath.   aspirin 81 MG EC tablet Take by mouth.   atorvastatin (LIPITOR) 40 MG tablet TAKE 1 TABLET BY MOUTH   DAILY   Calcium Carbonate-Vitamin D 600-400 MG-UNIT tablet Take  by mouth.   citalopram (CELEXA) 40 MG tablet TAKE 1 TABLET BY MOUTH  DAILY   clobetasol ointment (TEMOVATE) 0.05 % Apply pea size amount daily x 4 weeks, then every other day x 4 weeks, then 2-3 times weekly for maintenance   ferrous sulfate 325 (65 FE) MG EC tablet Take 325 mg by mouth daily with breakfast.    furosemide (LASIX) 20 MG tablet TAKE 1 TABLET(20 MG) BY MOUTH DAILY AS NEEDED   gabapentin (NEURONTIN) 600 MG tablet TAKE 2 TABLETS BY MOUTH IN  THE MORNING AND 3 TABLETS  BY MOUTH IN THE EVENING (Patient taking differently: Take 600 mg by mouth 2 (two) times daily.)   glucose blood (ONE TOUCH ULTRA TEST) test strip TEST BLOOD SUGAR TWICE A DAY   HYDROcodone-acetaminophen (NORCO) 10-325 MG tablet TAKE 1 TABLET BY MOUTH THREE TIMES DAILY AS NEEDED FOR CHRONIC PAIN   latanoprost (XALATAN) 0.005 % ophthalmic solution INSTILL 1 DROP INTO THE  LEFT EYE AT BEDTIME  (REFRIGERATE UNTIL FIRST  OPENED FOR USE) (Patient taking differently: Place 1 drop into both eyes.)   Melatonin 5 MG CAPS Take by mouth.   metFORMIN (GLUCOPHAGE) 500 MG tablet TAKE 2 TABLETS BY MOUTH IN  THE MORNING AND TAKE 1  TABLET AT SUPPER (Patient taking differently: Take 500 mg by mouth 2 (two) times daily with a meal.)   omeprazole (PRILOSEC) 40 MG capsule Take 1 capsule (40 mg total) by mouth daily.   No facility-administered encounter medications on file as of 06/13/2021.     Lab Results  Component Value Date   WBC 4.3 10/12/2020   HGB 12.9 10/12/2020   HCT 38.8 10/12/2020   PLT 289.0 10/12/2020   GLUCOSE 86 02/08/2021   CHOL 119 02/08/2021   TRIG 80.0 02/08/2021   HDL 43.90 02/08/2021   LDLCALC 59 02/08/2021   ALT 13 02/08/2021   AST 11 02/08/2021   NA 143 02/08/2021   K 4.8 02/08/2021   CL 106 02/08/2021   CREATININE 0.65 02/08/2021   BUN 9 02/08/2021   CO2 29 02/08/2021   TSH 1.61 10/12/2020   HGBA1C 6.4 02/08/2021   MICROALBUR 1.1  10/12/2020       Assessment & Plan:   Problem List Items Addressed This Visit     Anemia    Follow cbc.  Colonoscopy 2018.  Recommended f/u in 5 years.       Chronic back pain    Seeing neurosurgery. Planning for f/u MRI as outlined.  On hydrocodone.        Diabetes mellitus ([place])    Low carb diet and exercise given elevated sugars.   Continues on metformin.  Follow met b and a1c.        GERD (gastroesophageal reflux disease)    Continues on omeprazole.       History of breast cancer    Followed by oncology.       Hypercholesterolemia    Continue lipitor.  Low cholesterol diet and exercise.  Follow lipid panel and liver function tests.        Hypertension    Blood pressure as outlined.  Doing well. Has lasix to take prn.   Follow pressures.  Follow metabolic panel.       Lichen sclerosus    Followed by gyn.  Uses clobetasol.       Lung nodule    Has been followed by oncology.  F/u chest CT stable.  Recommended no further scanning.  Skin lesion    Circular lesion - left breast.  Nystatin cream. Follow.  Call with update.  Also changes with her finger nail and nail bed.  abx ointment.  Follow.  Call with update.       Stress    Increased stress.  Discussed.  Continues on citalopram.        Other Visit Diagnoses     Need for influenza vaccination    -  Primary   Relevant Orders   Flu Vaccine QUAD High Dose(Fluad) (Completed)        Einar Pheasant, MD

## 2021-06-14 NOTE — Addendum Note (Signed)
Addended by: Lars Masson on: 06/14/2021 01:42 PM   Modules accepted: Orders

## 2021-06-14 NOTE — Telephone Encounter (Signed)
Future labs ordered.  

## 2021-06-16 DIAGNOSIS — M5416 Radiculopathy, lumbar region: Secondary | ICD-10-CM | POA: Diagnosis not present

## 2021-06-16 DIAGNOSIS — M4726 Other spondylosis with radiculopathy, lumbar region: Secondary | ICD-10-CM | POA: Diagnosis not present

## 2021-06-16 DIAGNOSIS — M5115 Intervertebral disc disorders with radiculopathy, thoracolumbar region: Secondary | ICD-10-CM | POA: Diagnosis not present

## 2021-06-16 DIAGNOSIS — M48061 Spinal stenosis, lumbar region without neurogenic claudication: Secondary | ICD-10-CM | POA: Diagnosis not present

## 2021-06-18 ENCOUNTER — Encounter: Payer: Self-pay | Admitting: Internal Medicine

## 2021-06-18 DIAGNOSIS — L989 Disorder of the skin and subcutaneous tissue, unspecified: Secondary | ICD-10-CM | POA: Insufficient documentation

## 2021-06-18 NOTE — Assessment & Plan Note (Signed)
Continue lipitor.  Low cholesterol diet and exercise.  Follow lipid panel and liver function tests.   

## 2021-06-18 NOTE — Assessment & Plan Note (Signed)
Increased stress.  Discussed.  Continues on citalopram.

## 2021-06-18 NOTE — Assessment & Plan Note (Signed)
Follow cbc.  Colonoscopy 2018.  Recommended f/u in 5 years.

## 2021-06-18 NOTE — Assessment & Plan Note (Signed)
Followed by oncology 

## 2021-06-18 NOTE — Assessment & Plan Note (Signed)
Low carb diet and exercise given elevated sugars.   Continues on metformin.  Follow met b and a1c.   

## 2021-06-18 NOTE — Assessment & Plan Note (Signed)
Continues on omeprazole.   

## 2021-06-18 NOTE — Assessment & Plan Note (Signed)
Followed by gyn.  Uses clobetasol.

## 2021-06-18 NOTE — Assessment & Plan Note (Signed)
Blood pressure as outlined.  Doing well. Has lasix to take prn.   Follow pressures.  Follow metabolic panel.

## 2021-06-18 NOTE — Assessment & Plan Note (Signed)
Has been followed by oncology.  F/u chest CT stable.  Recommended no further scanning.   

## 2021-06-18 NOTE — Assessment & Plan Note (Signed)
Circular lesion - left breast.  Nystatin cream. Follow.  Call with update.  Also changes with her finger nail and nail bed.  abx ointment.  Follow.  Call with update.

## 2021-06-18 NOTE — Assessment & Plan Note (Signed)
Seeing neurosurgery. Planning for f/u MRI as outlined.  On hydrocodone.

## 2021-06-19 ENCOUNTER — Ambulatory Visit: Payer: Medicare Other

## 2021-06-19 ENCOUNTER — Encounter: Payer: Self-pay | Admitting: Internal Medicine

## 2021-06-20 ENCOUNTER — Other Ambulatory Visit: Payer: Self-pay

## 2021-06-20 MED ORDER — OMEPRAZOLE 40 MG PO CPDR: 40.0000 mg | DELAYED_RELEASE_CAPSULE | Freq: Every day | ORAL | 1 refills | Status: DC

## 2021-06-21 DIAGNOSIS — M5136 Other intervertebral disc degeneration, lumbar region: Secondary | ICD-10-CM | POA: Diagnosis not present

## 2021-06-21 DIAGNOSIS — G894 Chronic pain syndrome: Secondary | ICD-10-CM | POA: Diagnosis not present

## 2021-06-21 DIAGNOSIS — M4722 Other spondylosis with radiculopathy, cervical region: Secondary | ICD-10-CM | POA: Diagnosis not present

## 2021-06-21 DIAGNOSIS — M5416 Radiculopathy, lumbar region: Secondary | ICD-10-CM | POA: Diagnosis not present

## 2021-06-29 ENCOUNTER — Ambulatory Visit: Payer: Medicare Other

## 2021-06-29 ENCOUNTER — Telehealth: Payer: Self-pay | Admitting: Internal Medicine

## 2021-06-29 ENCOUNTER — Other Ambulatory Visit: Payer: Self-pay

## 2021-06-29 ENCOUNTER — Other Ambulatory Visit: Payer: Medicare Other

## 2021-06-29 MED ORDER — METFORMIN HCL 500 MG PO TABS: 500.0000 mg | ORAL_TABLET | Freq: Two times a day (BID) | ORAL | 1 refills | Status: DC

## 2021-06-29 NOTE — Telephone Encounter (Signed)
Spoke with patient to confirm she is taking metformin 500 mg BID. She has been on this dose since January. Sent in new rx with correct directions to mail order pharmacy. Patient is aware.

## 2021-06-29 NOTE — Telephone Encounter (Signed)
THN with Patient's insurance calling in with medication adherence concern.   States that the Patient's Metformin is prescribed Sig: TAKE 2 TABLETS BY MOUTH IN  THE MORNING AND TAKE 1  TABLET AT SUPPER.   Patient states she has only been taking one pill daily. IS this the correct does? If so medication list needs to be updated to current dose.

## 2021-07-05 ENCOUNTER — Ambulatory Visit: Payer: Medicare Other

## 2021-07-05 ENCOUNTER — Other Ambulatory Visit: Payer: Medicare Other

## 2021-07-17 ENCOUNTER — Ambulatory Visit: Payer: Medicare Other

## 2021-07-19 DIAGNOSIS — M5136 Other intervertebral disc degeneration, lumbar region: Secondary | ICD-10-CM | POA: Diagnosis not present

## 2021-07-19 DIAGNOSIS — M4722 Other spondylosis with radiculopathy, cervical region: Secondary | ICD-10-CM | POA: Diagnosis not present

## 2021-07-19 DIAGNOSIS — G894 Chronic pain syndrome: Secondary | ICD-10-CM | POA: Diagnosis not present

## 2021-07-19 DIAGNOSIS — M5416 Radiculopathy, lumbar region: Secondary | ICD-10-CM | POA: Diagnosis not present

## 2021-07-20 ENCOUNTER — Other Ambulatory Visit: Payer: Self-pay | Admitting: Internal Medicine

## 2021-07-20 DIAGNOSIS — I10 Essential (primary) hypertension: Secondary | ICD-10-CM

## 2021-08-09 DIAGNOSIS — M810 Age-related osteoporosis without current pathological fracture: Secondary | ICD-10-CM | POA: Diagnosis not present

## 2021-08-11 ENCOUNTER — Other Ambulatory Visit: Payer: Self-pay | Admitting: Internal Medicine

## 2021-08-14 DIAGNOSIS — M5416 Radiculopathy, lumbar region: Secondary | ICD-10-CM | POA: Diagnosis not present

## 2021-08-14 DIAGNOSIS — G894 Chronic pain syndrome: Secondary | ICD-10-CM | POA: Diagnosis not present

## 2021-08-14 DIAGNOSIS — M4722 Other spondylosis with radiculopathy, cervical region: Secondary | ICD-10-CM | POA: Diagnosis not present

## 2021-08-25 ENCOUNTER — Telehealth: Payer: Self-pay | Admitting: Internal Medicine

## 2021-08-25 NOTE — Telephone Encounter (Signed)
Copied from [place] [phone]. Topic: Medicare AWV >> Aug 25, 2021 11:19 AM Harris-Coley, Hannah Beat wrote: Reason for CRM: Left message for patient to schedule Annual Wellness Visit.  Please schedule with Nurse Health Advisor Denisa O'Brien-Blaney, LPN at [facility].  Please call [phone] ask for [facility]

## 2021-09-07 ENCOUNTER — Ambulatory Visit: Payer: Medicare Other

## 2021-09-12 ENCOUNTER — Other Ambulatory Visit: Payer: Self-pay

## 2021-09-12 ENCOUNTER — Ambulatory Visit (INDEPENDENT_AMBULATORY_CARE_PROVIDER_SITE_OTHER): Payer: Medicare Other | Admitting: Internal Medicine

## 2021-09-12 ENCOUNTER — Encounter: Payer: Self-pay | Admitting: Internal Medicine

## 2021-09-12 DIAGNOSIS — E7800 Pure hypercholesterolemia, unspecified: Secondary | ICD-10-CM

## 2021-09-12 DIAGNOSIS — G8929 Other chronic pain: Secondary | ICD-10-CM

## 2021-09-12 DIAGNOSIS — L900 Lichen sclerosus et atrophicus: Secondary | ICD-10-CM

## 2021-09-12 DIAGNOSIS — D649 Anemia, unspecified: Secondary | ICD-10-CM

## 2021-09-12 DIAGNOSIS — M5442 Lumbago with sciatica, left side: Secondary | ICD-10-CM

## 2021-09-12 DIAGNOSIS — K219 Gastro-esophageal reflux disease without esophagitis: Secondary | ICD-10-CM

## 2021-09-12 DIAGNOSIS — Z86010 Personal history of colonic polyps: Secondary | ICD-10-CM | POA: Diagnosis not present

## 2021-09-12 DIAGNOSIS — E1165 Type 2 diabetes mellitus with hyperglycemia: Secondary | ICD-10-CM | POA: Diagnosis not present

## 2021-09-12 DIAGNOSIS — R911 Solitary pulmonary nodule: Secondary | ICD-10-CM | POA: Diagnosis not present

## 2021-09-12 DIAGNOSIS — I10 Essential (primary) hypertension: Secondary | ICD-10-CM | POA: Diagnosis not present

## 2021-09-12 DIAGNOSIS — Z853 Personal history of malignant neoplasm of breast: Secondary | ICD-10-CM

## 2021-09-12 DIAGNOSIS — F439 Reaction to severe stress, unspecified: Secondary | ICD-10-CM

## 2021-09-12 NOTE — Progress Notes (Signed)
Patient ID: Sherry Simon, female   DOB: 1946/01/24, 75 y.o.   MRN: [id number]   Subjective:    Patient ID: Sherry Simon, female    DOB: 22-Jan-1946, 75 y.o.   MRN: [id number]  This visit occurred during the SARS-CoV-2 public health emergency.  Safety protocols were in place, including screening questions prior to the visit, additional usage of staff PPE, and extensive cleaning of exam room while observing appropriate contact time as indicated for disinfecting solutions.   Patient here for a scheduled follow up.    HPI Here to follow up regarding her blood sugar, blood pressure and cholesterol.  Is followed by pain clinic for persistent neck and low back pain.  Had updated MRI 05/2021 - no significant change.  Persistent pain - worse at night and pain radiates to the posterior left thigh.  Worse with walking and sitting in car.  Worse over the last 6 months.  On gabapentin and hydrocodone.  Neck pain - headache.  Headache resolves when up and moving.  Has f/u tomorrow with her pain MD.  Still with increased stress - dealing with pain and trying to deal with her husband's health issues as well.  No chest pain.  Breathing stable.  No acid reflux or abdominal pain reported.  Bowels stable.  Colonoscopy 2018 - recommended f/u in 5 years.     Past Medical History:  Diagnosis Date   Anxiety and depression    Colon polyps    Depression    Diabetes mellitus ([place])    Endometriosis    requiring hysterectomy   History of chicken pox    Hypercholesterolemia    Hypertension    Nephrolithiasis    Pericarditis    recurrent, unkown origin   Tachycardia    Past Surgical History:  Procedure Laterality Date   ABDOMINAL HYSTERECTOMY  1980   APPENDECTOMY  49   BACK SURGERY  1005-2010   laminectomy   CHOLECYSTECTOMY     open   OOPHORECTOMY  1982   TONSILLECTOMY     Family History  Problem Relation Age of Onset   Heart disease Father        myocardial infarction - died 13   Thyroid  disease Mother    Transient ischemic attack Mother        multiple   Breast cancer Mother    Hyperlipidemia Mother    Kidney disease Mother    Diabetes Mother    Rheumatic fever Sister        mitral valve problems   Breast cancer Paternal Aunt    Other Sister        Small vessel disease   Colon cancer Neg Hx    Social History   Socioeconomic History   Marital status: Married    Spouse name: Not on file   Number of children: 3   Years of education: Not on file   Highest education level: Not on file  Occupational History   Not on file  Tobacco Use   Smoking status: Never   Smokeless tobacco: Never  Vaping Use   Vaping Use: Never used  Substance and Sexual Activity   Alcohol use: No    Alcohol/week: 0.0 standard drinks   Drug use: No   Sexual activity: Never  Other Topics Concern   Not on file  Social History Narrative   Not on file   Social Determinants of Health   Financial Resource Strain: Not on file  Food Insecurity: Not on  file  Transportation Needs: Not on file  Physical Activity: Not on file  Stress: Not on file  Social Connections: Not on file     Review of Systems  Constitutional:  Negative for appetite change and unexpected weight change.  HENT:  Negative for congestion and sinus pressure.   Respiratory:  Negative for cough, chest tightness and shortness of breath.   Cardiovascular:  Negative for chest pain, palpitations and leg swelling.  Gastrointestinal:  Negative for abdominal pain, diarrhea, nausea and vomiting.  Genitourinary:  Negative for difficulty urinating and dysuria.  Musculoskeletal:  Positive for back pain and neck pain. Negative for joint swelling and myalgias.  Skin:  Negative for color change and rash.  Neurological:  Negative for dizziness, light-headedness and headaches.  Psychiatric/Behavioral:  Negative for agitation.        Increased stress as outlined.        Objective:     BP 128/72    Pulse 83    Temp (!) 95.7 F  (35.4 C)    Ht 5' 4.02" (1.626 m)    Wt 161 lb 6.4 oz (73.2 kg)    SpO2 97%    BMI 27.69 kg/m  Wt Readings from Last 3 Encounters:  09/12/21 161 lb 6.4 oz (73.2 kg)  06/13/21 160 lb (72.6 kg)  02/13/21 162 lb (73.5 kg)    Physical Exam Vitals reviewed.  Constitutional:      General: She is not in acute distress.    Appearance: Normal appearance.  HENT:     Head: Normocephalic and atraumatic.     Right Ear: External ear normal.     Left Ear: External ear normal.  Eyes:     General: No scleral icterus.       Right eye: No discharge.        Left eye: No discharge.     Conjunctiva/sclera: Conjunctivae normal.  Neck:     Thyroid: No thyromegaly.  Cardiovascular:     Rate and Rhythm: Normal rate and regular rhythm.  Pulmonary:     Effort: No respiratory distress.     Breath sounds: Normal breath sounds. No wheezing.  Abdominal:     General: Bowel sounds are normal.     Palpations: Abdomen is soft.     Tenderness: There is no abdominal tenderness.  Musculoskeletal:        General: No swelling or tenderness.     Cervical back: Neck supple. No tenderness.  Lymphadenopathy:     Cervical: No cervical adenopathy.  Skin:    Findings: No erythema or rash.  Neurological:     Mental Status: She is alert.  Psychiatric:        Mood and Affect: Mood normal.        Behavior: Behavior normal.     Outpatient Encounter Medications as of 09/12/2021  Medication Sig   acyclovir (ZOVIRAX) 400 MG tablet TAKE 1 TABLET BY MOUTH  DAILY   albuterol (VENTOLIN HFA) 108 (90 Base) MCG/ACT inhaler Inhale 2 puffs into the lungs every 6 (six) hours as needed for wheezing or shortness of breath.   aspirin 81 MG EC tablet Take by mouth.   atorvastatin (LIPITOR) 40 MG tablet TAKE 1 TABLET BY MOUTH  DAILY   Calcium Carbonate-Vitamin D 600-400 MG-UNIT tablet Take by mouth.   citalopram (CELEXA) 40 MG tablet TAKE 1 TABLET BY MOUTH  DAILY   clobetasol ointment (TEMOVATE) 0.05 % Apply pea size amount daily  x 4 weeks, then every other  day x 4 weeks, then 2-3 times weekly for maintenance   ferrous sulfate 325 (65 FE) MG EC tablet Take 325 mg by mouth daily with breakfast.    furosemide (LASIX) 20 MG tablet TAKE 1 TABLET(20 MG) BY MOUTH DAILY AS NEEDED   gabapentin (NEURONTIN) 600 MG tablet TAKE 2 TABLETS BY MOUTH IN  THE MORNING AND 3 TABLETS  BY MOUTH IN THE EVENING (Patient taking differently: Take 600 mg by mouth 2 (two) times daily.)   glucose blood (ONE TOUCH ULTRA TEST) test strip TEST BLOOD SUGAR TWICE A DAY   HYDROcodone-acetaminophen (NORCO) 10-325 MG tablet TAKE 1 TABLET BY MOUTH THREE TIMES DAILY AS NEEDED FOR CHRONIC PAIN   latanoprost (XALATAN) 0.005 % ophthalmic solution INSTILL 1 DROP INTO THE  LEFT EYE AT BEDTIME  (REFRIGERATE UNTIL FIRST  OPENED FOR USE) (Patient taking differently: Place 1 drop into both eyes.)   Melatonin 5 MG CAPS Take by mouth.   metFORMIN (GLUCOPHAGE) 500 MG tablet Take 1 tablet (500 mg total) by mouth 2 (two) times daily with a meal.   nystatin cream (MYCOSTATIN) Apply 1 application topically 2 (two) times daily.   omeprazole (PRILOSEC) 40 MG capsule TAKE 1 CAPSULE(40 MG) BY MOUTH DAILY   No facility-administered encounter medications on file as of 09/12/2021.     Lab Results  Component Value Date   WBC 4.3 10/12/2020   HGB 12.9 10/12/2020   HCT 38.8 10/12/2020   PLT 289.0 10/12/2020   GLUCOSE 86 02/08/2021   CHOL 119 02/08/2021   TRIG 80.0 02/08/2021   HDL 43.90 02/08/2021   LDLCALC 59 02/08/2021   ALT 13 02/08/2021   AST 11 02/08/2021   NA 143 02/08/2021   K 4.8 02/08/2021   CL 106 02/08/2021   CREATININE 0.65 02/08/2021   BUN 9 02/08/2021   CO2 29 02/08/2021   TSH 1.61 10/12/2020   HGBA1C 6.4 02/08/2021   MICROALBUR 1.1 10/12/2020       Assessment & Plan:   Problem List Items Addressed This Visit     Anemia    Follow cbc.  Colonoscopy 2018.  Recommended f/u in 5 years.       Relevant Orders   CBC with Differential/Platelet    Chronic back pain    Seeing neurosurgery. Had f/u MRI as outlined.  On hydrocodone.  Seeing pain clinic.        Diabetes mellitus ([place])    Low carb diet and exercise given elevated sugars.   Continues on metformin.  Follow met b and a1c.        GERD (gastroesophageal reflux disease)    Continues on omeprazole.       History of breast cancer    Followed by oncology.  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       History of colonic polyps    Colonoscopy 2018.  Recommended f/u in 5 years.        Hypercholesterolemia    Continue lipitor.  Low cholesterol diet and exercise.  Follow lipid panel and liver function tests.        Relevant Orders   TSH   Hypertension    Not on any medication currently.  Has lasix to take prn.  Blood pressure as outlined.  Follow pressures.  Follow metabolic panel.        Lichen sclerosus    Has been followed by gyn.  Clobetasol.        Lung nodule    Has been followed by oncology.  F/u chest CT stable.  Recommended no further scanning.        Stress    Increased stress.  Discussed.  Continues on citalopram.  Does not feel needs any further intervention at this time.         Einar Pheasant, MD

## 2021-09-13 DIAGNOSIS — G894 Chronic pain syndrome: Secondary | ICD-10-CM | POA: Diagnosis not present

## 2021-09-13 DIAGNOSIS — M4802 Spinal stenosis, cervical region: Secondary | ICD-10-CM | POA: Diagnosis not present

## 2021-09-13 DIAGNOSIS — M4722 Other spondylosis with radiculopathy, cervical region: Secondary | ICD-10-CM | POA: Diagnosis not present

## 2021-09-13 DIAGNOSIS — M5416 Radiculopathy, lumbar region: Secondary | ICD-10-CM | POA: Diagnosis not present

## 2021-09-22 ENCOUNTER — Encounter: Payer: Self-pay | Admitting: Internal Medicine

## 2021-09-22 NOTE — Assessment & Plan Note (Signed)
Seeing neurosurgery. Had f/u MRI as outlined.  On hydrocodone.  Seeing pain clinic.

## 2021-09-22 NOTE — Assessment & Plan Note (Signed)
Continue lipitor.  Low cholesterol diet and exercise.  Follow lipid panel and liver function tests.   

## 2021-09-22 NOTE — Assessment & Plan Note (Signed)
Increased stress.  Discussed.  Continues on citalopram.  Does not feel needs any further intervention at this time.

## 2021-09-22 NOTE — Assessment & Plan Note (Signed)
Follow cbc.  Colonoscopy 2018.  Recommended f/u in 5 years.

## 2021-09-22 NOTE — Assessment & Plan Note (Signed)
Has been followed by oncology.  F/u chest CT stable.  Recommended no further scanning.   

## 2021-09-22 NOTE — Assessment & Plan Note (Signed)
Followed by oncology.  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

## 2021-09-22 NOTE — Assessment & Plan Note (Signed)
Continues on omeprazole.   

## 2021-09-22 NOTE — Assessment & Plan Note (Signed)
Low carb diet and exercise given elevated sugars.   Continues on metformin.  Follow met b and a1c.

## 2021-09-22 NOTE — Assessment & Plan Note (Signed)
Has been followed by gyn.  Clobetasol.   

## 2021-09-22 NOTE — Assessment & Plan Note (Signed)
Not on any medication currently.  Has lasix to take prn.  Blood pressure as outlined.  Follow pressures.  Follow metabolic panel.   

## 2021-09-22 NOTE — Assessment & Plan Note (Signed)
Colonoscopy 2018.  Recommended f/u in 5 years.   

## 2021-10-02 DIAGNOSIS — L900 Lichen sclerosus et atrophicus: Secondary | ICD-10-CM | POA: Diagnosis not present

## 2021-10-03 DIAGNOSIS — M4802 Spinal stenosis, cervical region: Secondary | ICD-10-CM | POA: Diagnosis not present

## 2021-10-03 DIAGNOSIS — M4722 Other spondylosis with radiculopathy, cervical region: Secondary | ICD-10-CM | POA: Diagnosis not present

## 2021-10-03 DIAGNOSIS — M50121 Cervical disc disorder at C4-C5 level with radiculopathy: Secondary | ICD-10-CM | POA: Diagnosis not present

## 2021-10-03 DIAGNOSIS — M5013 Cervical disc disorder with radiculopathy, cervicothoracic region: Secondary | ICD-10-CM | POA: Diagnosis not present

## 2021-10-04 ENCOUNTER — Other Ambulatory Visit: Payer: Medicare Other

## 2021-10-05 ENCOUNTER — Other Ambulatory Visit (INDEPENDENT_AMBULATORY_CARE_PROVIDER_SITE_OTHER): Payer: Medicare Other

## 2021-10-05 ENCOUNTER — Other Ambulatory Visit: Payer: Self-pay

## 2021-10-05 DIAGNOSIS — I10 Essential (primary) hypertension: Secondary | ICD-10-CM

## 2021-10-05 DIAGNOSIS — E7800 Pure hypercholesterolemia, unspecified: Secondary | ICD-10-CM

## 2021-10-05 DIAGNOSIS — D649 Anemia, unspecified: Secondary | ICD-10-CM | POA: Diagnosis not present

## 2021-10-05 DIAGNOSIS — E1165 Type 2 diabetes mellitus with hyperglycemia: Secondary | ICD-10-CM

## 2021-10-05 LAB — CBC WITH DIFFERENTIAL/PLATELET
Basophils Absolute: 0 10*3/uL (ref 0.0–0.1)
Basophils Relative: 0.5 % (ref 0.0–3.0)
Eosinophils Absolute: 0.1 10*3/uL (ref 0.0–0.7)
Eosinophils Relative: 1.9 % (ref 0.0–5.0)
HCT: 38.2 % (ref 36.0–46.0)
Hemoglobin: 12.7 g/dL (ref 12.0–15.0)
Lymphocytes Relative: 34.4 % (ref 12.0–46.0)
Lymphs Abs: 1.4 10*3/uL (ref 0.7–4.0)
MCHC: 33.2 g/dL (ref 30.0–36.0)
MCV: 93.5 fl (ref 78.0–100.0)
Monocytes Absolute: 0.3 10*3/uL (ref 0.1–1.0)
Monocytes Relative: 8.4 % (ref 3.0–12.0)
Neutro Abs: 2.2 10*3/uL (ref 1.4–7.7)
Neutrophils Relative %: 54.8 % (ref 43.0–77.0)
Platelets: 295 10*3/uL (ref 150.0–400.0)
RBC: 4.08 Mil/uL (ref 3.87–5.11)
RDW: 12.8 % (ref 11.5–15.5)
WBC: 4.1 10*3/uL (ref 4.0–10.5)

## 2021-10-05 LAB — LIPID PANEL
Cholesterol: 139 mg/dL (ref 0–200)
HDL: 50.8 mg/dL (ref >39.00)
LDL Cholesterol: 76 mg/dL (ref 0–99)
NonHDL: 88.43
Total CHOL/HDL Ratio: 3
Triglycerides: 63 mg/dL (ref 0.0–149.0)
VLDL: 12.6 mg/dL (ref 0.0–40.0)

## 2021-10-05 LAB — BASIC METABOLIC PANEL
BUN: 15 mg/dL (ref 6–23)
CO2: 29 mEq/L (ref 19–32)
Calcium: 9.8 mg/dL (ref 8.4–10.5)
Chloride: 103 mEq/L (ref 96–112)
Creatinine, Ser: 0.65 mg/dL (ref 0.40–1.20)
GFR: 86.02 mL/min (ref >60.00)
Glucose, Bld: 100 mg/dL — ABNORMAL HIGH (ref 70–99)
Potassium: 5 mEq/L (ref 3.5–5.1)
Sodium: 139 mEq/L (ref 135–145)

## 2021-10-05 LAB — TSH: TSH: 1.42 u[IU]/mL (ref 0.35–5.50)

## 2021-10-05 LAB — HEPATIC FUNCTION PANEL
ALT: 16 U/L (ref 0–35)
AST: 16 U/L (ref 0–37)
Albumin: 4.5 g/dL (ref 3.5–5.2)
Alkaline Phosphatase: 76 U/L (ref 39–117)
Bilirubin, Direct: 0.2 mg/dL (ref 0.0–0.3)
Total Bilirubin: 1.3 mg/dL — ABNORMAL HIGH (ref 0.2–1.2)
Total Protein: 6.7 g/dL (ref 6.0–8.3)

## 2021-10-05 LAB — HEMOGLOBIN A1C: Hgb A1c MFr Bld: 6.3 % (ref 4.6–6.5)

## 2021-10-07 ENCOUNTER — Other Ambulatory Visit: Payer: Self-pay | Admitting: Internal Medicine

## 2021-10-07 NOTE — Progress Notes (Signed)
Order placed for abdominal ultrasound.   

## 2021-10-09 ENCOUNTER — Telehealth: Payer: Self-pay | Admitting: Internal Medicine

## 2021-10-09 ENCOUNTER — Other Ambulatory Visit: Payer: Self-pay

## 2021-10-09 MED ORDER — ACYCLOVIR 400 MG PO TABS: ORAL_TABLET | ORAL | 3 refills | Status: DC

## 2021-10-09 NOTE — Telephone Encounter (Signed)
Pt is needing a Rx refill for acyclovir (ZOVIRAX) 400 MG tablet  Pharmacy is  [facility] ([place], [place] Phone:  [phone]  Fax:  [phone]     Please advise and Thank you!  Pt is all out of it.   Call pt at [phone].

## 2021-10-09 NOTE — Telephone Encounter (Signed)
Medication refilled

## 2021-10-19 DIAGNOSIS — H43813 Vitreous degeneration, bilateral: Secondary | ICD-10-CM | POA: Diagnosis not present

## 2021-10-19 DIAGNOSIS — H4422 Degenerative myopia, left eye: Secondary | ICD-10-CM | POA: Diagnosis not present

## 2021-10-19 DIAGNOSIS — H35372 Puckering of macula, left eye: Secondary | ICD-10-CM | POA: Diagnosis not present

## 2021-10-19 DIAGNOSIS — H3322 Serous retinal detachment, left eye: Secondary | ICD-10-CM | POA: Diagnosis not present

## 2021-10-19 DIAGNOSIS — H35052 Retinal neovascularization, unspecified, left eye: Secondary | ICD-10-CM | POA: Diagnosis not present

## 2021-10-19 DIAGNOSIS — E1169 Type 2 diabetes mellitus with other specified complication: Secondary | ICD-10-CM | POA: Diagnosis not present

## 2021-10-23 DIAGNOSIS — M4802 Spinal stenosis, cervical region: Secondary | ICD-10-CM | POA: Diagnosis not present

## 2021-10-23 DIAGNOSIS — M5416 Radiculopathy, lumbar region: Secondary | ICD-10-CM | POA: Diagnosis not present

## 2021-10-23 DIAGNOSIS — M4722 Other spondylosis with radiculopathy, cervical region: Secondary | ICD-10-CM | POA: Diagnosis not present

## 2021-10-23 DIAGNOSIS — G894 Chronic pain syndrome: Secondary | ICD-10-CM | POA: Diagnosis not present

## 2021-11-02 ENCOUNTER — Other Ambulatory Visit: Payer: Self-pay | Admitting: Internal Medicine

## 2021-11-13 ENCOUNTER — Telehealth: Payer: Self-pay | Admitting: Internal Medicine

## 2021-11-13 NOTE — Telephone Encounter (Signed)
Refaxed and got confirmation via fax machine that fax went through.

## 2021-11-13 NOTE — Telephone Encounter (Signed)
[facility]iology calling in to check on the status of referral form they faxed over on 11/07/21. Spoke with Larena Glassman and form was faxed and stamped sent on 11/09/21.   Was informed that [facility] had not gotten this back. Confirmed the fax number on the bottom of the form and faxed it again at the front desk fax machine. They will keep an eye out for the referral on their fax this afternoon as the Patient is scheduled to be seen tomorrow, 11/14/21.

## 2021-11-14 DIAGNOSIS — H903 Sensorineural hearing loss, bilateral: Secondary | ICD-10-CM | POA: Diagnosis not present

## 2021-11-15 DIAGNOSIS — M5416 Radiculopathy, lumbar region: Secondary | ICD-10-CM | POA: Diagnosis not present

## 2021-11-15 DIAGNOSIS — M4722 Other spondylosis with radiculopathy, cervical region: Secondary | ICD-10-CM | POA: Diagnosis not present

## 2021-11-15 DIAGNOSIS — G894 Chronic pain syndrome: Secondary | ICD-10-CM | POA: Diagnosis not present

## 2021-11-20 ENCOUNTER — Ambulatory Visit: Payer: Medicare Other | Attending: Internal Medicine

## 2021-11-21 ENCOUNTER — Encounter: Payer: Self-pay | Admitting: Adult Health

## 2021-11-21 ENCOUNTER — Telehealth (INDEPENDENT_AMBULATORY_CARE_PROVIDER_SITE_OTHER): Payer: Medicare Other | Admitting: Adult Health

## 2021-11-21 DIAGNOSIS — T300 Burn of unspecified body region, unspecified degree: Secondary | ICD-10-CM

## 2021-11-21 DIAGNOSIS — R051 Acute cough: Secondary | ICD-10-CM

## 2021-11-21 DIAGNOSIS — U071 COVID-19: Secondary | ICD-10-CM | POA: Diagnosis not present

## 2021-11-21 MED ORDER — AMOXICILLIN-POT CLAVULANATE 875-125 MG PO TABS: 1.0000 | ORAL_TABLET | Freq: Two times a day (BID) | ORAL | 0 refills | Status: DC

## 2021-11-21 NOTE — Patient Instructions (Signed)
Burn Care, Adult A burn is an injury to the skin or the tissues under the skin. There are three types of burns: First degree. These burns may cause the skin to be red and a bit swollen. Second degree. These burns are very painful and cause the skin to be very red. The skin may also swell, leak fluid, look shiny, and start to have blisters. Third degree. These burns cause lasting damage. They turn the skin white or black and make it look charred, dry, and leathery. Treatment for your burn will depend on the type of burn you have. Taking good care of your burn can help to prevent pain and infection. It can also help the burn heal quickly. How to care for a first-degree burn Right after a burn: Rinse or soak the burn under cool water for 5 minutes or more. Do not put ice on your burn. That can cause more damage. Put a cool, clean, wet cloth on your burn. Put lotion or gel with aloe vera on your burn. Caring for the burn Clean and care for your burn. Your doctor may tell you: To clean the burn using soap and water. To pat the burn dry using a clean cloth. Do not rub or scrub the burn. To put lotion or gel with aloe vera on your burn. How to care for a second-degree burn Right after a burn: Rinse or soak the burn under cool water. Do this for 5 to 10 minutes. Do not put ice on your burn. This can cause more damage. Remove any jewelry near the burned area. Cover the burn with a clean cloth. Caring for the burn Raise (elevate) the burned area above the level of your heart while sitting or lying down. Clean and care for your burn. Your doctor may tell you: To clean or rinse your burn. To put a cream or ointment on the burn. To place a germ-free (sterile) dressing over the burn. A dressing is a material that is placed on a burn to help it heal. How to care for a third-degree burn Right after a burn: Cover the burn with a clean, dry cloth. Seek treatment right away if you have this kind of burn.  You may: Need to stay in the hospital. Have surgery to remove burned tissue. Have surgery to put new skin on the burned area. Be given fluids through an IV tube. Caring for the burn Clean and care for your burn. Your doctor may tell you: To clean or rinse your burn. To put a cream or ointment on the burn. To put a sterile dressing in the burn. This is called packing. To place a sterile dressing over the burn. Other things to do Raise the burned area above the level of your heart while sitting or lying down. Wear splints or immobilizers if told by your doctor. Rest as told by your doctor. Do not do sports or other activities until your doctor approves. How to prevent infection when caring for a burn  Take these steps to prevent infection: Wash your hands with soap and water for at least 20 seconds before and after caring for your burn. If you cannot use soap and water, use hand sanitizer. Wear clean or sterile gloves as told by your doctor. Do not put butter, oil, toothpaste, or other home remedies on the burn. Do not scratch or pick at the burn. Do not break any blisters. Do not peel the skin. Do not rub your burn, even when  you are cleaning it. Check your burn every day for these signs of infection: More redness, swelling, or pain. Warmth. Pus or a bad smell. Red streaks around the burn. Follow these instructions at home Medicines Take over-the-counter and prescription medicines only as told by your doctor. If you were prescribed an antibiotic medicine, use it as told by your doctor. Do not stop using the antibiotic even if your condition gets better. Your doctor may ask you to take medicine for pain before you change your dressing. General instructions  Protect your burn from the sun. Drink enough fluid to keep your pee (urine) pale yellow. Do not use any products that contain nicotine or tobacco, such as cigarettes, e-cigarettes, and chewing tobacco. These can delay healing.  If you need help quitting, ask your doctor. Keep all follow-up visits as told by your doctor. This is important. Contact a doctor if: Your condition does not get better. Your condition gets worse. You have a fever or chills. Your burn feels warm to the touch. You have more redness, swelling, or pain on your burn. Your burn looks different or starts to have black or red spots on it. Your pain does not get better with medicine. Get help right away if: You have more fluid, blood, or pus coming from your burn. You have red streaks near the burn. You have very bad pain. Summary There are three types of burns. They are first degree, second degree, and third degree. Of these, a third-degree burn is most serious. This must be treated right away. Treatment for your burn will depend on the type of burn you have. Do not put butter, oil, toothpaste, or other home remedies on the burn. These things can damage your skin. Follow instructions from your doctor about how to clean and take care of your burn. This information is not intended to replace advice given to you by your health care provider. Make sure you discuss any questions you have with your health care provider. Document Revised: 10/30/2019 Document Reviewed: 06/30/2019 Elsevier Patient Education  [place]. Amoxicillin; Clavulanic Acid Tablets What is this medication? AMOXICILLIN; CLAVULANIC ACID (a mox i SIL in; KLAV yoo lan ic AS id) treats infections caused by bacteria. It belongs to a group of medications called penicillin antibiotics. It will not treat colds, the flu, or infections caused by viruses. This medicine may be used for other purposes; ask your health care provider or pharmacist if you have questions. COMMON BRAND NAME(S): Augmentin What should I tell my care team before I take this medication? They need to know if you have any of these conditions: Kidney disease Liver disease Mononucleosis Stomach or intestine  problems such as colitis An unusual or allergic reaction to amoxicillin, other penicillin or cephalosporin antibiotics, clavulanic acid, other medications, foods, dyes, or preservatives Pregnant or trying to get pregnant Breast-feeding How should I use this medication? Take this medication by mouth. Take it as directed on the prescription label at the same time every day. Take it with food at the start of a meal or snack. Take all of this medication unless your care team tells you to stop it early. Keep taking it even if you think you are better. Talk to your care team about the use of this medication in children. While it may be prescribed for selected conditions, precautions do apply. Overdosage: If you think you have taken too much of this medicine contact a poison control center or emergency room at once. NOTE: This  medicine is only for you. Do not share this medicine with others. What if I miss a dose? If you miss a dose, take it as soon as you can. If it is almost time for your next dose, take only that dose. Do not take double or extra doses. What may interact with this medication? Allopurinol Anticoagulants Birth control pills Methotrexate Probenecid This list may not describe all possible interactions. Give your health care provider a list of all the medicines, herbs, non-prescription drugs, or dietary supplements you use. Also tell them if you smoke, drink alcohol, or use illegal drugs. Some items may interact with your medicine. What should I watch for while using this medication? Tell your care team if your symptoms do not start to get better or if they get worse. This medication may cause serious skin reactions. They can happen weeks to months after starting the medication. Contact your care team right away if you notice fevers or flu-like symptoms with a rash. The rash may be red or purple and then turn into blisters or peeling of the skin. Or, you might notice a red rash with  swelling of the face, lips or lymph nodes in your neck or under your arms. Do not treat diarrhea with over the counter products. Contact your care team if you have diarrhea that lasts more than 2 days or if it is severe and watery. If you have diabetes, you may get a false-positive result for sugar in your urine. Check with your care team. Birth control may not work properly while you are taking this medication. Talk to your care team about using an extra method of birth control. What side effects may I notice from receiving this medication? Side effects that you should report to your care team as soon as possible: Allergic reactions--skin rash, itching, hives, swelling of the face, lips, tongue, or throat Liver injury--right upper belly pain, loss of appetite, nausea, light-colored stool, dark yellow or brown urine, yellowing skin or eyes, unusual weakness or fatigue Redness, blistering, peeling, or loosening of the skin, including inside the mouth Severe diarrhea, fever Unusual vaginal discharge, itching, or odor Side effects that usually do not require medical attention (report to your care team if they continue or are bothersome): Diarrhea Nausea Vomiting This list may not describe all possible side effects. Call your doctor for medical advice about side effects. You may report side effects to FDA at 1-800-FDA-1088. Where should I keep my medication? Keep out of the reach of children and pets. Store at room temperature between 20 and 25 degrees C (68 and 77 degrees F). Throw away any unused medication after the expiration date. NOTE: This sheet is a summary. It may not cover all possible information. If you have questions about this medicine, talk to your doctor, pharmacist, or health care provider.  2022 Elsevier/Gold Standard (2020-09-04 00:00:00) 10 Things You Can Do to Manage Your COVID-19 Symptoms at Home If you have possible or confirmed COVID-19 Stay home except to get medical  care. Monitor your symptoms carefully. If your symptoms get worse, call your healthcare provider immediately. Get rest and stay hydrated. If you have a medical appointment, call the healthcare provider ahead of time and tell them that you have or may have COVID-19. For medical emergencies, call 911 and notify the dispatch personnel that you have or may have COVID-19. Cover your cough and sneezes with a tissue or use the inside of your elbow. Wash your hands often with soap and water  for at least 20 seconds or clean your hands with an alcohol-based hand sanitizer that contains at least 60% alcohol. As much as possible, stay in a specific room and away from other people in your home. Also, you should use a separate bathroom, if available. If you need to be around other people in or outside of the home, wear a mask. Avoid sharing personal items with other people in your household, like dishes, towels, and bedding. Clean all surfaces that are touched often, like counters, tabletops, and doorknobs. Use household cleaning sprays or wipes according to the label instructions. [email or web address] 04/08/2020 This information is not intended to replace advice given to you by your health care provider. Make sure you discuss any questions you have with your health care provider. Document Revised: 06/02/2021 Document Reviewed: 06/02/2021 Elsevier Patient Education  [place].

## 2021-11-21 NOTE — Progress Notes (Signed)
Virtual Visit via Video Note  I connected with Sherry Simon on 11/21/21 at 10:30 AM EST by a video enabled telemedicine application and verified that I am speaking with the correct person using two identifiers.  Location: Patient: at home  Provider: Provider: Provider's office at  [facility], [place].      I discussed the limitations of evaluation and management by telemedicine and the availability of in person appointments. The patient expressed understanding and agreed to proceed.  History of Present Illness: Tested positive at home test for Covid. On 11/20/21. Symptoms started on 11/19/21 with low grade fever 100, and cough. Nasal stuffiness, mild headache. Body aches. Dry non productive cough. Throat congestion. No sore throat.   No known exposures.  She feels she has mild symptoms  She is tired but feels well otherwise.   Patient  denies any  body aches,chills, rash, chest pain, shortness of breath, nausea, vomiting, or diarrhea.  Denies dizziness, lightheadedness, pre syncopal or syncopal episodes.   Pneumonia history 6- 7 years ago per patient. None since and was not hospital.  No history of covid in past per patient.    She also would like to discuss a burn to left lower anterior arm that occurred with using her curling iron and occurred 1 week ago, blister popped and drained, area is still tender and slightly warm pert patient.    Past Medical History:  Diagnosis Date   Anxiety and depression    Colon polyps    Depression    Diabetes mellitus ([place])    Endometriosis    requiring hysterectomy   History of chicken pox    Hypercholesterolemia    Hypertension    Nephrolithiasis    Pericarditis    recurrent, unkown origin   Tachycardia       Observations/Objective:   Patient is alert and oriented and responsive to questions Engages in conversation with provider. Speaks in full sentences without any pauses without any  shortness of breath or distress.    Able to visualize burn/ blister approximately 2 cm on left anterior arm. Mild surrounding erythema.  Assessment and Plan:   1. Positive self-administered antigen test for COVID-19 Discussed antiviral medication Molnupiravir and side effects possible, she prefers to not start medication due to mild symptoms and possible side effects known, is aware can start within 5 days of symptoms and she can call office as needed. Patient verbalized understanding of all instructions given and denies any further questions at this time.    Symptomatic with over the counter medications discussed.  Offered cough medication however she declined as cough is " not bad".   2. Acute cough Symptomatic with over the counter medications discussed.  Offered cough medication however she declined as cough is " not bad".   Discussed follow up visit in person for labs and chest x ray if any symptoms worsening at anytime.   3. Burn 2nd degree/ burn left lower arm anterior on wrist.  Erythema present. Will treat for mild cellulitis. Over the counter burn cream advised, did not send silvadene due to cross allergy with her history of Azopt allergy.strict return precautions given. She will return to office in one week for recheck.  - amoxicillin-clavulanate (AUGMENTIN) 875-125 MG tablet; Take 1 tablet by mouth 2 (two) times daily.  Dispense: 20 tablet; Refill: 0  Discussed antibiotic use as directed and yogurt while on and one week post. Side effects possible were discussed.  Follow Up Instructions: Advised  in person evaluation at anytime is advised if any symptoms do not improve, worsen or change at any given time.  Red Flags discussed. The patient was given clear instructions to go to ER or return to medical center if any red flags develop, symptoms do not improve, worsen or new problems develop. They verbalized understanding.   Return in about 1 week (around 11/28/2021), or if symptoms  worsen or fail to improve, for at any time for any worsening symptoms, Go to Emergency room/ urgent care if worse.   recheck on burn left arm and covid.  I discussed the assessment and treatment plan with the patient. The patient was provided an opportunity to ask questions and all were answered. The patient agreed with the plan and demonstrated an understanding of the instructions.   The patient was advised to call back or seek an in-person evaluation if the symptoms worsen or if the condition fails to improve as anticipated.    Sherry Buffy, FNP

## 2021-12-13 ENCOUNTER — Other Ambulatory Visit: Payer: Self-pay

## 2021-12-13 ENCOUNTER — Ambulatory Visit (INDEPENDENT_AMBULATORY_CARE_PROVIDER_SITE_OTHER): Payer: Medicare Other | Admitting: Internal Medicine

## 2021-12-13 DIAGNOSIS — G8929 Other chronic pain: Secondary | ICD-10-CM | POA: Diagnosis not present

## 2021-12-13 DIAGNOSIS — Z853 Personal history of malignant neoplasm of breast: Secondary | ICD-10-CM | POA: Diagnosis not present

## 2021-12-13 DIAGNOSIS — Z860100 Personal history of colon polyps, unspecified: Secondary | ICD-10-CM

## 2021-12-13 DIAGNOSIS — L900 Lichen sclerosus et atrophicus: Secondary | ICD-10-CM | POA: Diagnosis not present

## 2021-12-13 DIAGNOSIS — M5442 Lumbago with sciatica, left side: Secondary | ICD-10-CM | POA: Diagnosis not present

## 2021-12-13 DIAGNOSIS — D649 Anemia, unspecified: Secondary | ICD-10-CM | POA: Diagnosis not present

## 2021-12-13 DIAGNOSIS — K219 Gastro-esophageal reflux disease without esophagitis: Secondary | ICD-10-CM | POA: Diagnosis not present

## 2021-12-13 DIAGNOSIS — E538 Deficiency of other specified B group vitamins: Secondary | ICD-10-CM

## 2021-12-13 DIAGNOSIS — E1165 Type 2 diabetes mellitus with hyperglycemia: Secondary | ICD-10-CM

## 2021-12-13 DIAGNOSIS — I10 Essential (primary) hypertension: Secondary | ICD-10-CM | POA: Diagnosis not present

## 2021-12-13 DIAGNOSIS — F439 Reaction to severe stress, unspecified: Secondary | ICD-10-CM

## 2021-12-13 DIAGNOSIS — E7800 Pure hypercholesterolemia, unspecified: Secondary | ICD-10-CM

## 2021-12-13 NOTE — Progress Notes (Signed)
Patient ID: Sherry Simon, female   DOB: 11-Nov-1945, 76 y.o.   MRN: [id number] ? ? ?Subjective:  ? ? Patient ID: Sherry Simon, female    DOB: 30-Oct-1945, 76 y.o.   MRN: [id number] ? ?This visit occurred during the SARS-CoV-2 public health emergency.  Safety protocols were in place, including screening questions prior to the visit, additional usage of staff PPE, and extensive cleaning of exam room while observing appropriate contact time as indicated for disinfecting solutions.  ? ?Patient here for a scheduled follow up.  ? ?Chief Complaint  ?Patient presents with  ? Follow-up  ?  4 mo f/u - Hypertension. Pt c/o severe pain in L Leg (mostly thigh area) and back. Pt does see ortho for this. Pt also requesting B12 inj. Got off her sched. For injection due to COVID.  ? .  ? ?HPI ?Recently diagnosed with covid - 11/21/21.  Better.  No residual problems.  Main complaint is that of persistent increased pain - back/thigh pain.  Being followed by [facility] clinic.  Taking hydrocodone.  Using a heating pad.  They have discussed placing nerve stim.  She is to follow up regarding decision.  Breathing stable.  No increased cough or congestion.  No chest pain.  No nausea or vomiting.  Seeing gyn for f/u LS.  Using clobetasol.  Had colonoscopy 2018.  Recommended f/u in 5 years.   ? ? ?Past Medical History:  ?Diagnosis Date  ? Anxiety and depression   ? Colon polyps   ? Depression   ? Diabetes mellitus ([place])   ? Endometriosis   ? requiring hysterectomy  ? History of chicken pox   ? Hypercholesterolemia   ? Hypertension   ? Nephrolithiasis   ? Pericarditis   ? recurrent, unkown origin  ? Tachycardia   ? ?Past Surgical History:  ?Procedure Laterality Date  ? ABDOMINAL HYSTERECTOMY  1980  ? APPENDECTOMY  1055  ? BACK SURGERY  1005-2010  ? laminectomy  ? CHOLECYSTECTOMY    ? open  ? OOPHORECTOMY  1982  ? TONSILLECTOMY    ? ?Family History  ?Problem Relation Age of Onset  ? Heart disease Father   ?     myocardial infarction - died  27  ? Thyroid disease Mother   ? Transient ischemic attack Mother   ?     multiple  ? Breast cancer Mother   ? Hyperlipidemia Mother   ? Kidney disease Mother   ? Diabetes Mother   ? Rheumatic fever Sister   ?     mitral valve problems  ? Breast cancer Paternal Aunt   ? Other Sister   ?     Small vessel disease  ? Colon cancer Neg Hx   ? ?Social History  ? ?Socioeconomic History  ? Marital status: Married  ?  Spouse name: Not on file  ? Number of children: 3  ? Years of education: Not on file  ? Highest education level: Not on file  ?Occupational History  ? Not on file  ?Tobacco Use  ? Smoking status: Never  ? Smokeless tobacco: Never  ?Vaping Use  ? Vaping Use: Never used  ?Substance and Sexual Activity  ? Alcohol use: No  ?  Alcohol/week: 0.0 standard drinks  ? Drug use: No  ? Sexual activity: Never  ?Other Topics Concern  ? Not on file  ?Social History Narrative  ? Not on file  ? ?Social Determinants of Health  ? ?Financial Resource Strain: Not  on file  ?Food Insecurity: Not on file  ?Transportation Needs: Not on file  ?Physical Activity: Not on file  ?Stress: Not on file  ?Social Connections: Not on file  ? ? ? ?Review of Systems  ?Constitutional:  Negative for appetite change and unexpected weight change.  ?HENT:  Negative for congestion and sinus pressure.   ?Respiratory:  Negative for cough, chest tightness and shortness of breath.   ?Cardiovascular:  Negative for chest pain, palpitations and leg swelling.  ?Gastrointestinal:  Negative for abdominal pain, diarrhea, nausea and vomiting.  ?Genitourinary:  Negative for difficulty urinating and dysuria.  ?Musculoskeletal:  Negative for myalgias.  ?     Back and leg pain as outlined.    ?Skin:  Negative for color change and rash.  ?Neurological:  Negative for dizziness, light-headedness and headaches.  ?Psychiatric/Behavioral:  Negative for agitation.   ?     Increased stress related to her health issues and her husband's health issues.   ? ?   ?Objective:  ?   ? ?BP 134/78 (BP Location: Left Arm, Patient Position: Sitting, Cuff Size: Small)   Pulse 76   Temp 98.1 ?F (36.7 ?C) (Oral)   Ht '5\' 4"'$  (1.626 m)   Wt 161 lb 9.6 oz (73.3 kg)   SpO2 98%   BMI 27.74 kg/m?  ?Wt Readings from Last 3 Encounters:  ?12/13/21 161 lb 9.6 oz (73.3 kg)  ?09/12/21 161 lb 6.4 oz (73.2 kg)  ?06/13/21 160 lb (72.6 kg)  ? ? ?Physical Exam ?Vitals reviewed.  ?Constitutional:   ?   General: She is not in acute distress. ?   Appearance: Normal appearance.  ?HENT:  ?   Head: Normocephalic and atraumatic.  ?   Right Ear: External ear normal.  ?   Left Ear: External ear normal.  ?Eyes:  ?   General: No scleral icterus.    ?   Right eye: No discharge.     ?   Left eye: No discharge.  ?   Conjunctiva/sclera: Conjunctivae normal.  ?Neck:  ?   Thyroid: No thyromegaly.  ?Cardiovascular:  ?   Rate and Rhythm: Normal rate and regular rhythm.  ?Pulmonary:  ?   Effort: No respiratory distress.  ?   Breath sounds: Normal breath sounds. No wheezing.  ?Abdominal:  ?   General: Bowel sounds are normal.  ?   Palpations: Abdomen is soft.  ?   Tenderness: There is no abdominal tenderness.  ?Musculoskeletal:     ?   General: No swelling or tenderness.  ?   Cervical back: Neck supple. No tenderness.  ?Lymphadenopathy:  ?   Cervical: No cervical adenopathy.  ?Skin: ?   Findings: No erythema or rash.  ?Neurological:  ?   Mental Status: She is alert.  ?Psychiatric:     ?   Mood and Affect: Mood normal.     ?   Behavior: Behavior normal.  ? ? ? ?Outpatient Encounter Medications as of 12/13/2021  ?Medication Sig  ? acyclovir (ZOVIRAX) 400 MG tablet TAKE 1 TABLET BY MOUTH  DAILY  ? albuterol (VENTOLIN HFA) 108 (90 Base) MCG/ACT inhaler Inhale 2 puffs into the lungs every 6 (six) hours as needed for wheezing or shortness of breath.  ? amoxicillin-clavulanate (AUGMENTIN) 875-125 MG tablet Take 1 tablet by mouth 2 (two) times daily.  ? aspirin 81 MG EC tablet Take by mouth.  ? atorvastatin (LIPITOR) 40 MG tablet TAKE 1  TABLET BY MOUTH  DAILY  ? Calcium  Carbonate-Vitamin D 600-400 MG-UNIT tablet Take by mouth.  ? citalopram (CELEXA) 40 MG tablet TAKE 1 TABLET BY MOUTH  DAILY  ? clobetasol ointment (TEMOVATE) 0.05 % Apply pea size amount daily x 4 weeks, then every other day x 4 weeks, then 2-3 times weekly for maintenance  ? ferrous sulfate 325 (65 FE) MG EC tablet Take 325 mg by mouth daily with breakfast.   ? furosemide (LASIX) 20 MG tablet TAKE 1 TABLET(20 MG) BY MOUTH DAILY AS NEEDED  ? gabapentin (NEURONTIN) 600 MG tablet TAKE 2 TABLETS BY MOUTH IN  THE MORNING AND 3 TABLETS  BY MOUTH IN THE EVENING (Patient taking differently: Take 600 mg by mouth 2 (two) times daily.)  ? glucose blood (ONE TOUCH ULTRA TEST) test strip TEST BLOOD SUGAR TWICE A DAY  ? HYDROcodone-acetaminophen (NORCO) 10-325 MG tablet TAKE 1 TABLET BY MOUTH THREE TIMES DAILY AS NEEDED FOR CHRONIC PAIN  ? latanoprost (XALATAN) 0.005 % ophthalmic solution INSTILL 1 DROP INTO THE  LEFT EYE AT BEDTIME  (REFRIGERATE UNTIL FIRST  OPENED FOR USE) (Patient taking differently: Place 1 drop into both eyes.)  ? Melatonin 5 MG CAPS Take by mouth.  ? metFORMIN (GLUCOPHAGE) 500 MG tablet TAKE 1 TABLET BY MOUTH  TWICE DAILY WITH A MEAL  ? nystatin cream (MYCOSTATIN) Apply 1 application topically 2 (two) times daily.  ? omeprazole (PRILOSEC) 40 MG capsule TAKE 1 CAPSULE(40 MG) BY MOUTH DAILY  ? ?No facility-administered encounter medications on file as of 12/13/2021.  ?  ? ?Lab Results  ?Component Value Date  ? WBC 4.1 10/05/2021  ? HGB 12.7 10/05/2021  ? HCT 38.2 10/05/2021  ? PLT 295.0 10/05/2021  ? GLUCOSE 100 (H) 10/05/2021  ? CHOL 139 10/05/2021  ? TRIG 63.0 10/05/2021  ? HDL 50.80 10/05/2021  ? [place] 76 10/05/2021  ? ALT 16 10/05/2021  ? AST 16 10/05/2021  ? NA 139 10/05/2021  ? K 5.0 10/05/2021  ? CL 103 10/05/2021  ? CREATININE 0.65 10/05/2021  ? BUN 15 10/05/2021  ? CO2 29 10/05/2021  ? TSH 1.42 10/05/2021  ? HGBA1C 6.3 10/05/2021  ? MICROALBUR 1.1 10/12/2020  ? ? ?    ?Assessment & Plan:  ? ?Problem List Items Addressed This Visit   ? ? Anemia  ?  Follow cbc.  Colonoscopy 2018.  Recommended f/u in 5 years.  ?  ?  ? B12 deficiency  ?  Got off schedule with covid.  B12 injectio

## 2021-12-18 ENCOUNTER — Encounter: Payer: Self-pay | Admitting: Internal Medicine

## 2021-12-18 NOTE — Assessment & Plan Note (Signed)
Continue lipitor.  Low cholesterol diet and exercise.  Follow lipid panel and liver function tests.   

## 2021-12-18 NOTE — Assessment & Plan Note (Signed)
Colonoscopy 2018.  Recommended f/u in 5 years.   

## 2021-12-18 NOTE — Assessment & Plan Note (Signed)
Low carb diet and exercise given elevated sugars.   Continues on metformin.  Follow met b and a1c.   

## 2021-12-18 NOTE — Assessment & Plan Note (Signed)
Got off schedule with covid.  B12 injection given today.  ?

## 2021-12-18 NOTE — Assessment & Plan Note (Signed)
Not on any medication currently.  Has lasix to take prn.  Blood pressure as outlined.  Follow pressures.  Follow metabolic panel.   

## 2021-12-18 NOTE — Assessment & Plan Note (Signed)
Increased pain (into leg).  Seeing [facility] clinic. Taking hydrocodone.  Continue f/u as outlined.   

## 2021-12-18 NOTE — Assessment & Plan Note (Signed)
Has been followed by gyn.  Clobetasol.   

## 2021-12-18 NOTE — Assessment & Plan Note (Signed)
Followed by oncology.  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  ?

## 2021-12-18 NOTE — Assessment & Plan Note (Signed)
Follow [facility].  Colonoscopy 2018.  Recommended f/u in 5 years.  ?

## 2021-12-18 NOTE — Assessment & Plan Note (Signed)
Increased stress.  Discussed.  Continues on citalopram.  Does not feel needs any further intervention at this time. Follow.  

## 2021-12-18 NOTE — Assessment & Plan Note (Signed)
Continues on omeprazole.   

## 2021-12-20 DIAGNOSIS — M4722 Other spondylosis with radiculopathy, cervical region: Secondary | ICD-10-CM | POA: Diagnosis not present

## 2021-12-20 DIAGNOSIS — G894 Chronic pain syndrome: Secondary | ICD-10-CM | POA: Diagnosis not present

## 2021-12-20 DIAGNOSIS — M5416 Radiculopathy, lumbar region: Secondary | ICD-10-CM | POA: Diagnosis not present

## 2021-12-26 ENCOUNTER — Ambulatory Visit (INDEPENDENT_AMBULATORY_CARE_PROVIDER_SITE_OTHER): Payer: Medicare Other

## 2021-12-26 VITALS — Ht 64.0 in | Wt 161.0 lb

## 2021-12-26 DIAGNOSIS — Z0000 Encounter for general adult medical examination without abnormal findings: Secondary | ICD-10-CM

## 2021-12-26 NOTE — Progress Notes (Signed)
Subjective:   Sherry Simon is a 76 y.o. female who presents for Medicare Annual (Subsequent) preventive examination.  Review of Systems    No ROS.  Medicare Wellness Virtual Visit.  Visual/audio telehealth visit, UTA vital signs.   See social history for additional risk factors.   Cardiac Risk Factors include: advanced age (>57men, >63 women);diabetes mellitus;hypertension     Objective:    Today's Vitals   12/26/21 1538  Weight: 161 lb (73 kg)  Height: 5\' 4"  (1.626 m)   Body mass index is 27.64 kg/m.     12/26/2021    3:51 PM 09/03/2019    3:40 PM 07/14/2019   10:56 AM 07/24/2017    5:43 PM 10/11/2015   11:35 AM  Advanced Directives  Does Patient Have a Medical Advance Directive? Yes No Yes Yes Yes  Type of Estate agent of [place];Living will  Healthcare Power of [place];Living will Living will Healthcare Power of [place];Living will  Does patient want to make changes to medical advance directive? No - Patient declined  No - Patient declined  No - Patient declined  Copy of Healthcare Power of Attorney in Chart? No - copy requested  No - copy requested  No - copy requested    Current Medications (verified) Outpatient Encounter Medications as of 12/26/2021  Medication Sig   acyclovir (ZOVIRAX) 400 MG tablet TAKE 1 TABLET BY MOUTH  DAILY   albuterol (VENTOLIN HFA) 108 (90 Base) MCG/ACT inhaler Inhale 2 puffs into the lungs every 6 (six) hours as needed for wheezing or shortness of breath.   amoxicillin-clavulanate (AUGMENTIN) 875-125 MG tablet Take 1 tablet by mouth 2 (two) times daily.   aspirin 81 MG EC tablet Take by mouth.   atorvastatin (LIPITOR) 40 MG tablet TAKE 1 TABLET BY MOUTH  DAILY   Calcium Carbonate-Vitamin D 600-400 MG-UNIT tablet Take by mouth.   citalopram (CELEXA) 40 MG tablet TAKE 1 TABLET BY MOUTH  DAILY   clobetasol ointment (TEMOVATE) 0.05 % Apply pea size amount daily x 4 weeks, then every other day x 4 weeks, then 2-3  times weekly for maintenance   ferrous sulfate 325 (65 FE) MG EC tablet Take 325 mg by mouth daily with breakfast.    furosemide (LASIX) 20 MG tablet TAKE 1 TABLET(20 MG) BY MOUTH DAILY AS NEEDED   gabapentin (NEURONTIN) 600 MG tablet TAKE 2 TABLETS BY MOUTH IN  THE MORNING AND 3 TABLETS  BY MOUTH IN THE EVENING (Patient taking differently: Take 600 mg by mouth 2 (two) times daily.)   glucose blood (ONE TOUCH ULTRA TEST) test strip TEST BLOOD SUGAR TWICE A DAY   HYDROcodone-acetaminophen (NORCO) 10-325 MG tablet TAKE 1 TABLET BY MOUTH THREE TIMES DAILY AS NEEDED FOR CHRONIC PAIN   latanoprost (XALATAN) 0.005 % ophthalmic solution INSTILL 1 DROP INTO THE  LEFT EYE AT BEDTIME  (REFRIGERATE UNTIL FIRST  OPENED FOR USE) (Patient taking differently: Place 1 drop into both eyes.)   Melatonin 5 MG CAPS Take by mouth.   metFORMIN (GLUCOPHAGE) 500 MG tablet TAKE 1 TABLET BY MOUTH  TWICE DAILY WITH A MEAL   nystatin cream (MYCOSTATIN) Apply 1 application topically 2 (two) times daily.   omeprazole (PRILOSEC) 40 MG capsule TAKE 1 CAPSULE(40 MG) BY MOUTH DAILY   No facility-administered encounter medications on file as of 12/26/2021.    Allergies (verified) Dorzolamide and Ivp dye [iodinated contrast media]   History: Past Medical History:  Diagnosis Date   Anxiety and depression  Colon polyps    Depression    Diabetes mellitus (HCC)    Endometriosis    requiring hysterectomy   History of chicken pox    Hypercholesterolemia    Hypertension    Nephrolithiasis    Pericarditis    recurrent, unkown origin   Tachycardia    Past Surgical History:  Procedure Laterality Date   ABDOMINAL HYSTERECTOMY  1980   APPENDECTOMY  1055   BACK SURGERY  1005-2010   laminectomy   CHOLECYSTECTOMY     open   OOPHORECTOMY  1982   TONSILLECTOMY     Family History  Problem Relation Age of Onset   Heart disease Father        myocardial infarction - died 34   Thyroid disease Mother    Transient ischemic  attack Mother        multiple   Breast cancer Mother    Hyperlipidemia Mother    Kidney disease Mother    Diabetes Mother    Rheumatic fever Sister        mitral valve problems   Breast cancer Paternal Aunt    Other Sister        Small vessel disease   Colon cancer Neg Hx    Social History   Socioeconomic History   Marital status: Married    Spouse name: Not on file   Number of children: 3   Years of education: Not on file   Highest education level: Not on file  Occupational History   Not on file  Tobacco Use   Smoking status: Never   Smokeless tobacco: Never  Vaping Use   Vaping Use: Never used  Substance and Sexual Activity   Alcohol use: No    Alcohol/week: 0.0 standard drinks   Drug use: No   Sexual activity: Never  Other Topics Concern   Not on file  Social History Narrative   Not on file   Social Determinants of Health   Financial Resource Strain: Low Risk    Difficulty of Paying Living Expenses: Not hard at all  Food Insecurity: No Food Insecurity   Worried About Programme researcher, broadcasting/film/video in the Last Year: Never true   Ran Out of Food in the Last Year: Never true  Transportation Needs: No Transportation Needs   Lack of Transportation (Medical): No   Lack of Transportation (Non-Medical): No  Physical Activity: Not on file  Stress: No Stress Concern Present   Feeling of Stress : Not at all  Social Connections: Unknown   Frequency of Communication with Friends and Family: Not on file   Frequency of Social Gatherings with Friends and Family: Not on file   Attends Religious Services: Not on file   Active Member of Clubs or Organizations: Not on file   Attends Banker Meetings: Not on file   Marital Status: Married    Tobacco Counseling Counseling given: Not Answered   Clinical Intake:  Pre-visit preparation completed: Yes        Diabetes: Yes (Followed by PCP)  How often do you need to have someone help you when you read instructions,  pamphlets, or other written materials from your doctor or pharmacy?: 1 - Never   Interpreter Needed?: No    Activities of Daily Living    12/26/2021    3:53 PM  In your present state of health, do you have any difficulty performing the following activities:  Hearing? 1  Comment Followed by Audiology  Vision? 0  Difficulty concentrating or making decisions? 0  Walking or climbing stairs? 1  Comment Chronic knee and back pain. Paces self.  Dressing or bathing? 0  Doing errands, shopping? 0  Preparing Food and eating ? N  Using the Toilet? N  In the past six months, have you accidently leaked urine? N  Do you have problems with loss of bowel control? N  Managing your Medications? N  Managing your Finances? N  Housekeeping or managing your Housekeeping? N  Comment Paces self.    Patient Care Team: Dale Wilmington, MD as PCP - General (Internal Medicine)  Indicate any recent Medical Services you may have received from other than [facility] providers in the past year (date may be approximate).     Assessment:   This is a routine wellness examination for [place].  Virtual Visit via Telephone Note  I connected with  KELYSE KERRIGAN on 12/26/21 at  3:30 PM EDT by telephone and verified that I am speaking with the correct person using two identifiers.  Persons participating in the virtual visit: patient/Nurse Health Advisor   I discussed the limitations of performing an evaluation and management service by telehealth.  The patient expressed understanding and agreed to proceed. We continued and completed visit with audio only. Some vital signs may be absent or patient reported.   Hearing/Vision screen Hearing Screening - Comments:: Moderate hearing loss Does not wear hearing aids Vision Screening - Comments:: Followed by [facility],  Dr. Clent Ridges Visits q 3 months Partial retina detachment Glaucoma  Dietary issues and exercise activities discussed:   Regular diet Good water  intake   Goals Addressed               This Visit's Progress     Patient Stated     Increase physical activity (pt-stated)        Stay active       Depression Screen    12/26/2021    3:50 PM 12/13/2021   11:13 AM 09/12/2021   11:44 AM 09/30/2020    2:49 PM 07/14/2020   10:43 AM 07/14/2019   10:52 AM 02/06/2018    1:36 PM  PHQ 2/9 Scores  PHQ - 2 Score 0 0 0 0 0 0 3  PHQ- 9 Score       10    Fall Risk    12/26/2021    3:53 PM 12/13/2021   11:13 AM 09/12/2021   11:44 AM 09/30/2020    2:49 PM 07/14/2020   10:43 AM  Fall Risk   Falls in the past year? 0 0 0 0 0  Number falls in past yr: 0  0 0 0  Injury with Fall?   0 0   Risk for fall due to :  No Fall Risks     Follow up Falls evaluation completed Falls evaluation completed Falls evaluation completed Falls evaluation completed Falls evaluation completed   FALL RISK PREVENTION PERTAINING TO THE HOME: Home free of loose throw rugs in walkways, pet beds, electrical cords, etc? Yes  Adequate lighting in your home to reduce risk of falls? Yes   ASSISTIVE DEVICES UTILIZED TO PREVENT FALLS: Life alert? No  Use of a cane, walker or w/c? No   TIMED UP AND GO: Was the test performed? No .   Cognitive Function: Patient is alert and oriented x3.     10/11/2015   11:52 AM  MMSE - Mini Mental State Exam  Orientation to time 5  Orientation  to Place 5  Registration 3  Attention/ Calculation 5  Recall 3  Language- name 2 objects 2  Language- repeat 1  Language- follow 3 step command 3  Language- read & follow direction 1  Write a sentence 1  Copy design 1  Total score 30        07/14/2019   10:55 AM  6CIT Screen  What Year? 0 points  What month? 0 points  What time? 0 points  Count back from 20 0 points  Months in reverse 0 points  Repeat phrase 0 points  Total Score 0 points    Immunizations Immunization History  Administered Date(s) Administered   Fluad Quad(high Dose 65+) 06/13/2021   Influenza, High  Dose Seasonal PF 06/08/2016, 07/02/2017, 07/17/2018, 07/28/2019   Influenza,inj,Quad PF,6+ Mos 06/18/2014, 05/23/2015   Influenza-Unspecified 06/08/2016, 07/02/2017, 09/22/2020   PFIZER(Purple Top)SARS-COV-2 Vaccination 11/18/2019, 12/16/2019   Pneumococcal Conjugate-13 06/09/2016   Pneumococcal-Unspecified 06/08/2016   Tdap 09/01/2007, 07/25/2020   Screening Tests Health Maintenance  Topic Date Due   OPHTHALMOLOGY EXAM  06/13/2021   COVID-19 Vaccine (3 - Pfizer risk series) 12/29/2021 (Originally 01/13/2020)   Zoster Vaccines- Shingrix (1 of 2) 02/18/2022 (Originally 03/04/1965)   Pneumonia Vaccine 51+ Years old (2 - PPSV23 if available, else PCV20) 09/12/2022 (Originally 06/09/2017)   FOOT EXAM  02/13/2022   MAMMOGRAM  03/02/2022   HEMOGLOBIN A1C  04/04/2022   INFLUENZA VACCINE  04/24/2022   COLONOSCOPY (Pts 45-85yrs Insurance coverage will need to be confirmed)  07/15/2022   TETANUS/TDAP  07/25/2030   DEXA SCAN  Completed   Hepatitis C Screening  Completed   HPV VACCINES  Aged Out   Health Maintenance Health Maintenance Due  Topic Date Due   OPHTHALMOLOGY EXAM  06/13/2021   Lung Cancer Screening: (Low Dose CT Chest recommended if Age 29-80 years, 30 pack-year currently smoking OR have quit w/in 15years.) does not qualify.   Vision Screening: Recommended annual ophthalmology exams for early detection of glaucoma and other disorders of the eye.  Dental Screening: Recommended annual dental exams for proper oral hygiene  Community Resource Referral / Chronic Care Management: CRR required this visit?  No   CCM required this visit?  No      Plan:   Keep all routine maintenance appointments.   I have personally reviewed and noted the following in the patient's chart:   Medical and social history Use of alcohol, tobacco or illicit drugs  Current medications and supplements including opioid prescriptions. Taking opioid, followed by Neurology, Dr. Lynn Ito. Visits every one  month.  Functional ability and status Nutritional status Physical activity Advanced directives List of other physicians Hospitalizations, surgeries, and ER visits in previous 12 months Vitals Screenings to include cognitive, depression, and falls Referrals and appointments  In addition, I have reviewed and discussed with patient certain preventive protocols, quality metrics, and best practice recommendations. A written personalized care plan for preventive services as well as general preventive health recommendations were provided to patient.     Ashok Pall, LPN   09/29/1094

## 2021-12-26 NOTE — Patient Instructions (Addendum)
Sherry Simon , Thank you for taking time to come for your Medicare Wellness Visit. I appreciate your ongoing commitment to your health goals. Please review the following plan we discussed and let me know if I can assist you in the future.   These are the goals we discussed:  Goals       Patient Stated     Increase physical activity (pt-stated)      Stay active        This is a list of the screening recommended for you and due dates:  Health Maintenance  Topic Date Due   Eye exam for diabetics  06/13/2021   COVID-19 Vaccine (3 - Pfizer risk series) 12/29/2021*   Zoster (Shingles) Vaccine (1 of 2) 02/18/2022*   Pneumonia Vaccine (2 - PPSV23 if available, else PCV20) 09/12/2022*   Complete foot exam   02/13/2022   Mammogram  03/02/2022   Hemoglobin A1C  04/04/2022   Flu Shot  04/24/2022   Colon Cancer Screening  07/15/2022   Tetanus Vaccine  07/25/2030   DEXA scan (bone density measurement)  Completed   Hepatitis C Screening: USPSTF Recommendation to screen - Ages 57-79 yo.  Completed   HPV Vaccine  Aged Out  *Topic was postponed. The date shown is not the original due date.   Opioid Pain Medicine Management Opioids are powerful medicines that are used to treat moderate to severe pain. When used for short periods of time, they can help you to: Sleep better. Do better in physical or occupational therapy. Feel better in the first few days after an injury. Recover from surgery. Opioids should be taken with the supervision of a trained health care provider. They should be taken for the shortest period of time possible. This is because opioids can be addictive, and the longer you take opioids, the greater your risk of addiction. This addiction can also be called opioid use disorder. What are the risks? Using opioid pain medicines for longer than 3 days increases your risk of side effects. Side effects include: Constipation. Nausea and vomiting. Breathing difficulties (respiratory  depression). Drowsiness. Confusion. Opioid use disorder. Itching. Taking opioid pain medicine for a long period of time can affect your ability to do daily tasks. It also puts you at risk for: Motor vehicle crashes. Depression. Suicide. Heart attack. Overdose, which can be life-threatening. What is a pain treatment plan? A pain treatment plan is an agreement between you and your health care provider. Pain is unique to each person, and treatments vary depending on your condition. To manage your pain, you and your health care provider need to work together. To help you do this: Discuss the goals of your treatment, including how much pain you might expect to have and how you will manage the pain. Review the risks and benefits of taking opioid medicines. Remember that a good treatment plan uses more than one approach and minimizes the chance of side effects. Be honest about the amount of medicines you take and about any drug or alcohol use. Get pain medicine prescriptions from only one health care provider. Pain can be managed with many types of alternative treatments. Ask your health care provider to refer you to one or more specialists who can help you manage pain through: Physical or occupational therapy. Counseling (cognitive behavioral therapy). Good nutrition. Biofeedback. Massage. Meditation. Non-opioid medicine. Following a gentle exercise program. How to use opioid pain medicine Taking medicine Take your pain medicine exactly as told by your health care provider.  Take it only when you need it. If your pain gets less severe, you may take less than your prescribed dose if your health care provider approves. If you are not having pain, do nottake pain medicine unless your health care provider tells you to take it. If your pain is severe, do nottry to treat it yourself by taking more pills than instructed on your prescription. Contact your health care provider for help. Write down  the times when you take your pain medicine. It is easy to become confused while on pain medicine. Writing the time can help you avoid overdose. Take other over-the-counter or prescription medicines only as told by your health care provider. Keeping yourself and others safe  While you are taking opioid pain medicine: Do not drive, use machinery, or power tools. Do not sign legal documents. Do not drink alcohol. Do not take sleeping pills. Do not supervise children by yourself. Do not do activities that require climbing or being in high places. Do not go to a lake, river, ocean, spa, or swimming pool. Do not share your pain medicine with anyone. Keep pain medicine in a locked cabinet or in a secure area where pets and children cannot reach it. Stopping your use of opioids If you have been taking opioid medicine for more than a few weeks, you may need to slowly decrease (taper) how much you take until you stop completely. Tapering your use of opioids can decrease your risk of symptoms of withdrawal, such as: Pain and cramping in the abdomen. Nausea. Sweating. Sleepiness. Restlessness. Uncontrollable shaking (tremors). Cravings for the medicine. Do not attempt to taper your use of opioids on your own. Talk with your health care provider about how to do this. Your health care provider may prescribe a step-down schedule based on how much medicine you are taking and how long you have been taking it. Getting rid of leftover pills Do not save any leftover pills. Get rid of leftover pills safely by: Taking the medicine to a prescription take-back program. This is usually offered by the county or law enforcement. Bringing them to a pharmacy that has a drug disposal container. Flushing them down the toilet. Check the label or package insert of your medicine to see whether this is safe to do. Throwing them out in the trash. Check the label or package insert of your medicine to see whether this is  safe to do. If it is safe to throw it out, remove the medicine from the original container, put it into a sealable bag or container, and mix it with used coffee grounds, food scraps, dirt, or cat litter before putting it in the trash. Follow these instructions at home: Activity Do exercises as told by your health care provider. Avoid activities that make your pain worse. Return to your normal activities as told by your health care provider. Ask your health care provider what activities are safe for you. General instructions You may need to take these actions to prevent or treat constipation: Drink enough fluid to keep your urine pale yellow. Take over-the-counter or prescription medicines. Eat foods that are high in fiber, such as beans, whole grains, and fresh fruits and vegetables. Limit foods that are high in fat and processed sugars, such as fried or sweet foods. Keep all follow-up visits. This is important. Where to find support If you have been taking opioids for a long time, you may benefit from receiving support for quitting from a local support group or counselor. Ask  your health care provider for a referral to these resources in your area. Where to find more information Centers for Disease Control and Prevention (CDC): FootballExhibition.com.br [place] Food and Drug Administration (FDA): PumpkinSearch.com.ee Get help right away if: You may have taken too much of an opioid (overdosed). Common symptoms of an overdose: Your breathing is slower or more shallow than normal. You have a very slow heartbeat (pulse). You have slurred speech. You have nausea and vomiting. Your pupils become very small. You have other potential symptoms: You are very confused. You faint or feel like you will faint. You have cold, clammy skin. You have blue lips or fingernails. You have thoughts of harming yourself or harming others. These symptoms may represent a serious problem that is an emergency. Do not wait to see if the  symptoms will go away. Get medical help right away. Call your local emergency services (911 in the [place] Do not drive yourself to the hospital.  If you ever feel like you may hurt yourself or others, or have thoughts about taking your own life, get help right away. Go to your nearest emergency department or: Call your local emergency services (911 in the [place] Call the [facility] ((561) 471-3045 in the [place] Call a suicide crisis helpline, such as the National Suicide Prevention Lifeline at 716 358 2174 or 988 in the [place] This is open 24 hours a day in the [place] Text the Crisis Text Line at 904-677-7080 (in the [place] Summary Opioid medicines can help you manage moderate to severe pain for a short period of time. A pain treatment plan is an agreement between you and your health care provider. Discuss the goals of your treatment, including how much pain you might expect to have and how you will manage the pain. If you think that you or someone else may have taken too much of an opioid, get medical help right away. This information is not intended to replace advice given to you by your health care provider. Make sure you discuss any questions you have with your health care provider. Document Revised: 04/05/2021 Document Reviewed: 12/21/2020 Elsevier Patient Education  2022 [employer].

## 2021-12-29 ENCOUNTER — Other Ambulatory Visit: Payer: Self-pay | Admitting: Internal Medicine

## 2022-01-04 ENCOUNTER — Ambulatory Visit: Payer: Medicare Other | Attending: Internal Medicine

## 2022-01-08 DIAGNOSIS — M5136 Other intervertebral disc degeneration, lumbar region: Secondary | ICD-10-CM | POA: Diagnosis not present

## 2022-01-08 DIAGNOSIS — M5416 Radiculopathy, lumbar region: Secondary | ICD-10-CM | POA: Diagnosis not present

## 2022-01-08 DIAGNOSIS — G894 Chronic pain syndrome: Secondary | ICD-10-CM | POA: Diagnosis not present

## 2022-01-30 ENCOUNTER — Other Ambulatory Visit: Payer: Self-pay | Admitting: Internal Medicine

## 2022-02-14 DIAGNOSIS — M160 Bilateral primary osteoarthritis of hip: Secondary | ICD-10-CM | POA: Diagnosis not present

## 2022-02-14 DIAGNOSIS — G894 Chronic pain syndrome: Secondary | ICD-10-CM | POA: Diagnosis not present

## 2022-02-14 DIAGNOSIS — M25552 Pain in left hip: Secondary | ICD-10-CM | POA: Diagnosis not present

## 2022-02-14 DIAGNOSIS — M5416 Radiculopathy, lumbar region: Secondary | ICD-10-CM | POA: Diagnosis not present

## 2022-02-14 DIAGNOSIS — M4722 Other spondylosis with radiculopathy, cervical region: Secondary | ICD-10-CM | POA: Diagnosis not present

## 2022-02-26 ENCOUNTER — Other Ambulatory Visit: Payer: Medicare Other

## 2022-02-27 ENCOUNTER — Encounter: Payer: Medicare Other | Admitting: Internal Medicine

## 2022-03-01 ENCOUNTER — Encounter: Payer: Medicare Other | Admitting: Internal Medicine

## 2022-03-01 ENCOUNTER — Ambulatory Visit (INDEPENDENT_AMBULATORY_CARE_PROVIDER_SITE_OTHER): Payer: Medicare Other

## 2022-03-01 DIAGNOSIS — E538 Deficiency of other specified B group vitamins: Secondary | ICD-10-CM

## 2022-03-01 MED ORDER — CYANOCOBALAMIN 1000 MCG/ML IJ SOLN
1000.0000 ug | Freq: Once | INTRAMUSCULAR | Status: AC
  Administered 2022-03-01: 1000 ug via INTRAMUSCULAR

## 2022-03-01 NOTE — Progress Notes (Signed)
Sherry Simon presents today for injection per MD orders. B12  administered SQ in left Upper Arm. Administration without incident. Patient tolerated well.  Ruger Saxer,cma

## 2022-03-08 DIAGNOSIS — C50911 Malignant neoplasm of unspecified site of right female breast: Secondary | ICD-10-CM | POA: Diagnosis not present

## 2022-03-08 DIAGNOSIS — Z923 Personal history of irradiation: Secondary | ICD-10-CM | POA: Diagnosis not present

## 2022-03-08 DIAGNOSIS — Z170 Estrogen receptor positive status [ER+]: Secondary | ICD-10-CM | POA: Diagnosis not present

## 2022-03-08 DIAGNOSIS — Z1231 Encounter for screening mammogram for malignant neoplasm of breast: Secondary | ICD-10-CM | POA: Diagnosis not present

## 2022-03-08 LAB — HM MAMMOGRAPHY

## 2022-03-14 DIAGNOSIS — M4722 Other spondylosis with radiculopathy, cervical region: Secondary | ICD-10-CM | POA: Diagnosis not present

## 2022-03-14 DIAGNOSIS — G894 Chronic pain syndrome: Secondary | ICD-10-CM | POA: Diagnosis not present

## 2022-03-14 DIAGNOSIS — M5416 Radiculopathy, lumbar region: Secondary | ICD-10-CM | POA: Diagnosis not present

## 2022-04-06 ENCOUNTER — Other Ambulatory Visit: Payer: Self-pay | Admitting: Internal Medicine

## 2022-04-17 ENCOUNTER — Other Ambulatory Visit (INDEPENDENT_AMBULATORY_CARE_PROVIDER_SITE_OTHER): Payer: Medicare Other

## 2022-04-17 DIAGNOSIS — E7800 Pure hypercholesterolemia, unspecified: Secondary | ICD-10-CM

## 2022-04-17 DIAGNOSIS — E1165 Type 2 diabetes mellitus with hyperglycemia: Secondary | ICD-10-CM | POA: Diagnosis not present

## 2022-04-17 LAB — BASIC METABOLIC PANEL
BUN: 10 mg/dL (ref 6–23)
CO2: 28 mEq/L (ref 19–32)
Calcium: 9.5 mg/dL (ref 8.4–10.5)
Chloride: 102 mEq/L (ref 96–112)
Creatinine, Ser: 0.62 mg/dL (ref 0.40–1.20)
GFR: 86.69 mL/min (ref >60.00)
Glucose, Bld: 96 mg/dL (ref 70–99)
Potassium: 4.5 mEq/L (ref 3.5–5.1)
Sodium: 139 mEq/L (ref 135–145)

## 2022-04-17 LAB — LIPID PANEL
Cholesterol: 148 mg/dL (ref 0–200)
HDL: 51 mg/dL (ref >39.00)
LDL Cholesterol: 78 mg/dL (ref 0–99)
NonHDL: 96.81
Total CHOL/HDL Ratio: 3
Triglycerides: 92 mg/dL (ref 0.0–149.0)
VLDL: 18.4 mg/dL (ref 0.0–40.0)

## 2022-04-17 LAB — HEPATIC FUNCTION PANEL
ALT: 12 U/L (ref 0–35)
AST: 13 U/L (ref 0–37)
Albumin: 4.4 g/dL (ref 3.5–5.2)
Alkaline Phosphatase: 66 U/L (ref 39–117)
Bilirubin, Direct: 0.3 mg/dL (ref 0.0–0.3)
Total Bilirubin: 1.5 mg/dL — ABNORMAL HIGH (ref 0.2–1.2)
Total Protein: 6.6 g/dL (ref 6.0–8.3)

## 2022-04-17 LAB — HEMOGLOBIN A1C: Hgb A1c MFr Bld: 6.4 % (ref 4.6–6.5)

## 2022-04-24 ENCOUNTER — Encounter: Payer: Self-pay | Admitting: Internal Medicine

## 2022-04-24 ENCOUNTER — Ambulatory Visit (INDEPENDENT_AMBULATORY_CARE_PROVIDER_SITE_OTHER): Payer: Medicare Other | Admitting: Internal Medicine

## 2022-04-24 VITALS — BP 140/72 | HR 85 | Temp 98.3°F | Ht 64.0 in | Wt 161.8 lb

## 2022-04-24 DIAGNOSIS — E538 Deficiency of other specified B group vitamins: Secondary | ICD-10-CM

## 2022-04-24 DIAGNOSIS — I10 Essential (primary) hypertension: Secondary | ICD-10-CM | POA: Diagnosis not present

## 2022-04-24 DIAGNOSIS — Z0000 Encounter for general adult medical examination without abnormal findings: Secondary | ICD-10-CM

## 2022-04-24 DIAGNOSIS — M549 Dorsalgia, unspecified: Secondary | ICD-10-CM

## 2022-04-24 DIAGNOSIS — D649 Anemia, unspecified: Secondary | ICD-10-CM | POA: Diagnosis not present

## 2022-04-24 DIAGNOSIS — Z853 Personal history of malignant neoplasm of breast: Secondary | ICD-10-CM

## 2022-04-24 DIAGNOSIS — Z86010 Personal history of colonic polyps: Secondary | ICD-10-CM

## 2022-04-24 DIAGNOSIS — E7800 Pure hypercholesterolemia, unspecified: Secondary | ICD-10-CM | POA: Diagnosis not present

## 2022-04-24 DIAGNOSIS — K219 Gastro-esophageal reflux disease without esophagitis: Secondary | ICD-10-CM

## 2022-04-24 DIAGNOSIS — R911 Solitary pulmonary nodule: Secondary | ICD-10-CM | POA: Diagnosis not present

## 2022-04-24 DIAGNOSIS — G8929 Other chronic pain: Secondary | ICD-10-CM

## 2022-04-24 DIAGNOSIS — E1165 Type 2 diabetes mellitus with hyperglycemia: Secondary | ICD-10-CM

## 2022-04-24 DIAGNOSIS — F439 Reaction to severe stress, unspecified: Secondary | ICD-10-CM

## 2022-04-24 DIAGNOSIS — L900 Lichen sclerosus et atrophicus: Secondary | ICD-10-CM | POA: Diagnosis not present

## 2022-04-24 MED ORDER — CYANOCOBALAMIN 1000 MCG/ML IJ SOLN
1000.0000 ug | Freq: Once | INTRAMUSCULAR | Status: AC
  Administered 2022-04-24: 1000 ug via INTRAMUSCULAR

## 2022-04-24 NOTE — Progress Notes (Signed)
Patient ID: BEATRICE ZIEHM, female   DOB: 10-Oct-1945, 76 y.o.   MRN: [id number]   Subjective:    Patient ID: JULIANA BOLING, female    DOB: 09-May-1946, 76 y.o.   MRN: [id number]   Patient here for physical exam.   Chief Complaint  Patient presents with   Follow-up    Physical   .   HPI Continues to have significant back pain and pain into left hip and leg.  Is followed by pain clinic.  Pain is aggravated by the physical activity she has to do - lifting her husband, etc. Saw her oncologist 6/15.  Stable.  Recommended f/u in one year.  DXA - stable osteopenia.  Increased stress related to her husband's medical issues.  Discussed.  Will notify me if feels needs further intervention.  No chest pain.  Breathing stable.  No abdominal pain or bowel change reported.     Past Medical History:  Diagnosis Date   Anxiety and depression    Colon polyps    Depression    Diabetes mellitus ([place])    Endometriosis    requiring hysterectomy   History of chicken pox    Hypercholesterolemia    Hypertension    Nephrolithiasis    Pericarditis    recurrent, unkown origin   Tachycardia    Past Surgical History:  Procedure Laterality Date   ABDOMINAL HYSTERECTOMY  1980   APPENDECTOMY  55   BACK SURGERY  1005-2010   laminectomy   CHOLECYSTECTOMY     open   OOPHORECTOMY  1982   TONSILLECTOMY     Family History  Problem Relation Age of Onset   Heart disease Father        myocardial infarction - died 39   Thyroid disease Mother    Transient ischemic attack Mother        multiple   Breast cancer Mother    Hyperlipidemia Mother    Kidney disease Mother    Diabetes Mother    Rheumatic fever Sister        mitral valve problems   Breast cancer Paternal Aunt    Other Sister        Small vessel disease   Colon cancer Neg Hx    Social History   Socioeconomic History   Marital status: Married    Spouse name: Not on file   Number of children: 3   Years of education: Not on file    Highest education level: Not on file  Occupational History   Not on file  Tobacco Use   Smoking status: Never   Smokeless tobacco: Never  Vaping Use   Vaping Use: Never used  Substance and Sexual Activity   Alcohol use: No    Alcohol/week: 0.0 standard drinks of alcohol   Drug use: No   Sexual activity: Never  Other Topics Concern   Not on file  Social History Narrative   Not on file   Social Determinants of Health   Financial Resource Strain: Low Risk  (12/26/2021)   Overall Financial Resource Strain (CARDIA)    Difficulty of Paying Living Expenses: Not hard at all  Food Insecurity: No Food Insecurity (12/26/2021)   Hunger Vital Sign    Worried About Running Out of Food in the Last Year: Never true    Ran Out of Food in the Last Year: Never true  Transportation Needs: No Transportation Needs (12/26/2021)   PRAPARE - Hydrologist (Medical): No  Lack of Transportation (Non-Medical): No  Physical Activity: Unknown (07/14/2019)   Exercise Vital Sign    Days of Exercise per Week: 0 days    Minutes of Exercise per Session: Not on file  Stress: No Stress Concern Present (12/26/2021)   [place]    Feeling of Stress : Not at all  Social Connections: Unknown (12/26/2021)   Social Connection and Isolation Panel [NHANES]    Frequency of Communication with Friends and Family: Not on file    Frequency of Social Gatherings with Friends and Family: Not on file    Attends Religious Services: Not on file    Active Member of Clubs or Organizations: Not on file    Attends Archivist Meetings: Not on file    Marital Status: Married     Review of Systems  Constitutional:  Negative for appetite change and unexpected weight change.  HENT:  Negative for congestion, sinus pressure and sore throat.   Eyes:  Negative for pain and visual disturbance.  Respiratory:  Negative for cough, chest  tightness and shortness of breath.   Cardiovascular:  Negative for chest pain, palpitations and leg swelling.  Gastrointestinal:  Negative for abdominal pain, diarrhea, nausea and vomiting.  Genitourinary:  Negative for difficulty urinating and dysuria.  Musculoskeletal:  Positive for back pain. Negative for joint swelling.  Skin:  Negative for color change and rash.  Neurological:  Negative for dizziness and headaches.  Hematological:  Negative for adenopathy. Does not bruise/bleed easily.  Psychiatric/Behavioral:  Negative for agitation and dysphoric mood.        Increased stress as outlined.        Objective:     BP (!) 140/72 (BP Location: Left Arm, Patient Position: Sitting, Cuff Size: Normal)   Pulse 85   Temp 98.3 F (36.8 C) (Oral)   Ht '5\' 4"'  (1.626 m)   Wt 161 lb 12.8 oz (73.4 kg)   SpO2 97%   BMI 27.77 kg/m  Wt Readings from Last 3 Encounters:  04/24/22 161 lb 12.8 oz (73.4 kg)  12/26/21 161 lb (73 kg)  12/13/21 161 lb 9.6 oz (73.3 kg)    Physical Exam Vitals reviewed.  Constitutional:      General: She is not in acute distress.    Appearance: Normal appearance. She is well-developed.  HENT:     Head: Normocephalic and atraumatic.     Right Ear: External ear normal.     Left Ear: External ear normal.  Eyes:     General: No scleral icterus.       Right eye: No discharge.        Left eye: No discharge.     Conjunctiva/sclera: Conjunctivae normal.  Neck:     Thyroid: No thyromegaly.  Cardiovascular:     Rate and Rhythm: Normal rate and regular rhythm.  Pulmonary:     Effort: No tachypnea, accessory muscle usage or respiratory distress.     Breath sounds: Normal breath sounds. No decreased breath sounds or wheezing.  Chest:  Breasts:    Right: No inverted nipple, mass, nipple discharge or tenderness (no axillary adenopathy).     Left: No inverted nipple, mass, nipple discharge or tenderness (no axilarry adenopathy).  Abdominal:     General: Bowel sounds  are normal.     Palpations: Abdomen is soft.     Tenderness: There is no abdominal tenderness.  Musculoskeletal:        General: No  swelling or tenderness.     Cervical back: Neck supple.  Lymphadenopathy:     Cervical: No cervical adenopathy.  Skin:    Findings: No erythema or rash.  Neurological:     Mental Status: She is alert and oriented to person, place, and time.  Psychiatric:        Mood and Affect: Mood normal.        Behavior: Behavior normal.      Outpatient Encounter Medications as of 04/24/2022  Medication Sig   acyclovir (ZOVIRAX) 400 MG tablet TAKE 1 TABLET BY MOUTH  DAILY   aspirin 81 MG EC tablet Take by mouth.   atorvastatin (LIPITOR) 40 MG tablet TAKE 1 TABLET BY MOUTH DAILY   Calcium Carbonate-Vitamin D 600-400 MG-UNIT tablet Take by mouth.   citalopram (CELEXA) 40 MG tablet TAKE 1 TABLET BY MOUTH  DAILY   clobetasol ointment (TEMOVATE) 0.05 % Apply pea size amount daily x 4 weeks, then every other day x 4 weeks, then 2-3 times weekly for maintenance   ferrous sulfate 325 (65 FE) MG EC tablet Take 325 mg by mouth daily with breakfast.    furosemide (LASIX) 20 MG tablet TAKE 1 TABLET(20 MG) BY MOUTH DAILY AS NEEDED   gabapentin (NEURONTIN) 600 MG tablet TAKE 2 TABLETS BY MOUTH IN  THE MORNING AND 3 TABLETS  IN THE EVENING   glucose blood (ONE TOUCH ULTRA TEST) test strip TEST BLOOD SUGAR TWICE A DAY   HYDROcodone-acetaminophen (NORCO) 10-325 MG tablet TAKE 1 TABLET BY MOUTH THREE TIMES DAILY AS NEEDED FOR CHRONIC PAIN   latanoprost (XALATAN) 0.005 % ophthalmic solution INSTILL 1 DROP INTO THE  LEFT EYE AT BEDTIME  (REFRIGERATE UNTIL FIRST  OPENED FOR USE) (Patient taking differently: Place 1 drop into both eyes.)   Melatonin 5 MG CAPS Take by mouth.   metFORMIN (GLUCOPHAGE) 500 MG tablet TAKE 1 TABLET BY MOUTH  TWICE DAILY WITH A MEAL   nystatin cream (MYCOSTATIN) Apply 1 application topically 2 (two) times daily.   omeprazole (PRILOSEC) 40 MG capsule TAKE 1  CAPSULE BY MOUTH  DAILY   [DISCONTINUED] amoxicillin-clavulanate (AUGMENTIN) 875-125 MG tablet Take 1 tablet by mouth 2 (two) times daily.   [DISCONTINUED] albuterol (VENTOLIN HFA) 108 (90 Base) MCG/ACT inhaler Inhale 2 puffs into the lungs every 6 (six) hours as needed for wheezing or shortness of breath. (Patient not taking: Reported on 04/24/2022)   [EXPIRED] cyanocobalamin (VITAMIN B12) injection 1,000 mcg    No facility-administered encounter medications on file as of 04/24/2022.     Lab Results  Component Value Date   WBC 4.1 10/05/2021   HGB 12.7 10/05/2021   HCT 38.2 10/05/2021   PLT 295.0 10/05/2021   GLUCOSE 96 04/17/2022   CHOL 148 04/17/2022   TRIG 92.0 04/17/2022   HDL 51.00 04/17/2022   LDLCALC 78 04/17/2022   ALT 12 04/17/2022   AST 13 04/17/2022   NA 139 04/17/2022   K 4.5 04/17/2022   CL 102 04/17/2022   CREATININE 0.62 04/17/2022   BUN 10 04/17/2022   CO2 28 04/17/2022   TSH 1.42 10/05/2021   HGBA1C 6.4 04/17/2022   MICROALBUR 1.1 10/12/2020       Assessment & Plan:   Problem List Items Addressed This Visit     Anemia    Follow [facility]bc.  Colonoscopy 2018.  Recommended f/u in 5 years. Due this year.       B12 deficiency    Continue b12 injections.  Back pain    Increased pain (into leg).  Seeing [facility]/pain clinic. Taking hydrocodone.  Continue f/u as outlined.        Diabetes mellitus ([place])    Low carb diet and exercise given elevated sugars.   Continues on metformin.  Follow met b and a1c.        Relevant Orders   Urine Microalbumin w/creat. ratio   Basic metabolic panel   Hemoglobin A1c   GERD (gastroesophageal reflux disease)    Continues on omeprazole.       Health care maintenance    Physical today 04/24/22.  mammmogram 03/08/22 - birads II.  Colonoscopy 06/2017 - recommended f/u in 5 years.       History of breast cancer    Followed by oncology.  Mammogram 03/08/22 - Birads II.       History of colonic polyps    Colonoscopy 2018.   Recommended f/u in 5 years.        Hypercholesterolemia    Continue lipitor.  Low cholesterol diet and exercise.  Follow lipid panel and liver function tests.        Relevant Orders   Hepatic function panel   TSH   Lipid panel   Hypertension    Not on any medication currently.  Has lasix to take prn.  Blood pressure as outlined.  Follow pressures.  Follow metabolic panel.        Relevant Orders   Basic metabolic panel   Lichen sclerosus    Has been followed by gyn.  Clobetasol.        Lung nodule    Has been followed by oncology.  F/u chest CT stable.  Recommended no further scanning.        Stress    Increased stress.  Discussed.  Continues on citalopram.  Does not feel needs any further intervention at this time. Follow.       Other Visit Diagnoses     Routine general medical examination at a health care facility    -  Primary        Einar Pheasant, MD

## 2022-04-24 NOTE — Assessment & Plan Note (Addendum)
Physical today 04/24/22.  mammmogram 03/08/22 - birads II.  Colonoscopy 06/2017 - recommended f/u in 5 years.

## 2022-04-30 ENCOUNTER — Encounter: Payer: Self-pay | Admitting: Internal Medicine

## 2022-04-30 NOTE — Assessment & Plan Note (Signed)
Increased stress.  Discussed.  Continues on citalopram.  Does not feel needs any further intervention at this time. Follow.  

## 2022-04-30 NOTE — Assessment & Plan Note (Signed)
Has been followed by oncology.  F/u chest CT stable.  Recommended no further scanning.

## 2022-04-30 NOTE — Assessment & Plan Note (Signed)
Low carb diet and exercise given elevated sugars.   Continues on metformin.  Follow met b and a1c.   

## 2022-04-30 NOTE — Assessment & Plan Note (Signed)
Continues on omeprazole.   

## 2022-04-30 NOTE — Assessment & Plan Note (Signed)
Continue b12 injections.  

## 2022-04-30 NOTE — Assessment & Plan Note (Signed)
Has been followed by gyn.  Clobetasol.

## 2022-04-30 NOTE — Assessment & Plan Note (Addendum)
Follow cbc.  Colonoscopy 2018.  Recommended f/u in 5 years. Due this year.

## 2022-04-30 NOTE — Assessment & Plan Note (Signed)
Not on any medication currently.  Has lasix to take prn.  Blood pressure as outlined.  Follow pressures.  Follow metabolic panel.   

## 2022-04-30 NOTE — Assessment & Plan Note (Signed)
Increased pain (into leg).  Seeing [facility]pain clinic. Taking hydrocodone.  Continue f/u as outlined.

## 2022-04-30 NOTE — Assessment & Plan Note (Signed)
Followed by oncology.  Mammogram 03/08/22 - Birads II.  

## 2022-04-30 NOTE — Assessment & Plan Note (Signed)
Continue lipitor.  Low cholesterol diet and exercise.  Follow lipid panel and liver function tests.   

## 2022-04-30 NOTE — Assessment & Plan Note (Signed)
Colonoscopy 2018.  Recommended f/u in 5 years.

## 2022-05-07 DIAGNOSIS — M4722 Other spondylosis with radiculopathy, cervical region: Secondary | ICD-10-CM | POA: Diagnosis not present

## 2022-05-07 DIAGNOSIS — G894 Chronic pain syndrome: Secondary | ICD-10-CM | POA: Diagnosis not present

## 2022-05-07 DIAGNOSIS — M5416 Radiculopathy, lumbar region: Secondary | ICD-10-CM | POA: Diagnosis not present

## 2022-05-07 DIAGNOSIS — M5136 Other intervertebral disc degeneration, lumbar region: Secondary | ICD-10-CM | POA: Diagnosis not present

## 2022-05-10 DIAGNOSIS — H401124 Primary open-angle glaucoma, left eye, indeterminate stage: Secondary | ICD-10-CM | POA: Diagnosis not present

## 2022-05-10 DIAGNOSIS — H401122 Primary open-angle glaucoma, left eye, moderate stage: Secondary | ICD-10-CM | POA: Diagnosis not present

## 2022-05-21 DIAGNOSIS — G453 Amaurosis fugax: Secondary | ICD-10-CM | POA: Diagnosis not present

## 2022-05-30 DIAGNOSIS — M4722 Other spondylosis with radiculopathy, cervical region: Secondary | ICD-10-CM | POA: Diagnosis not present

## 2022-05-30 DIAGNOSIS — M5416 Radiculopathy, lumbar region: Secondary | ICD-10-CM | POA: Diagnosis not present

## 2022-05-30 DIAGNOSIS — M5136 Other intervertebral disc degeneration, lumbar region: Secondary | ICD-10-CM | POA: Diagnosis not present

## 2022-05-30 DIAGNOSIS — G894 Chronic pain syndrome: Secondary | ICD-10-CM | POA: Diagnosis not present

## 2022-06-19 IMAGING — DX DG CHEST 2V
2 series · 2 of 2 positions shown · non-contrast
Comparison: 07/24/2017 chest radiograph.

CLINICAL DATA: Cough for 2 months

EXAM:
CHEST - 2 VIEW

[chest pa]
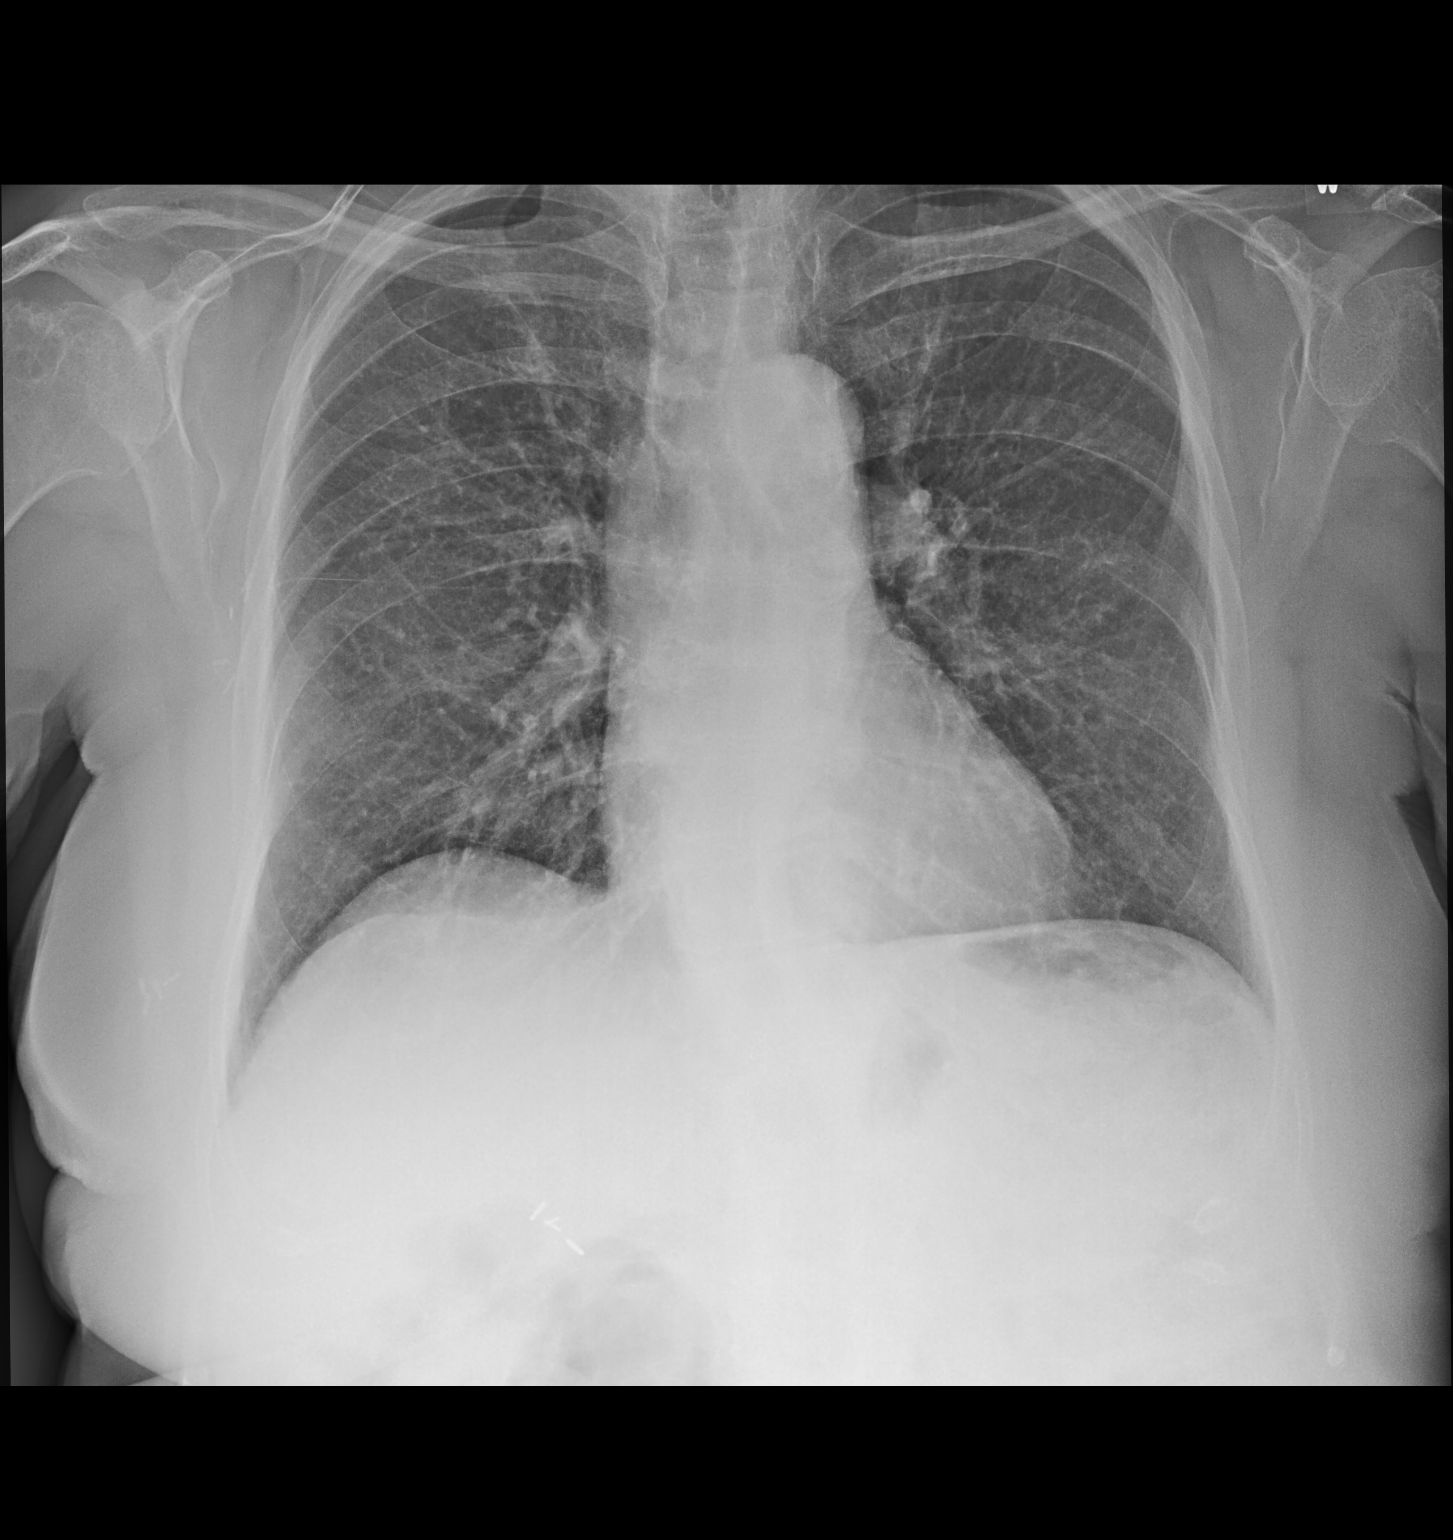

[chest lat]
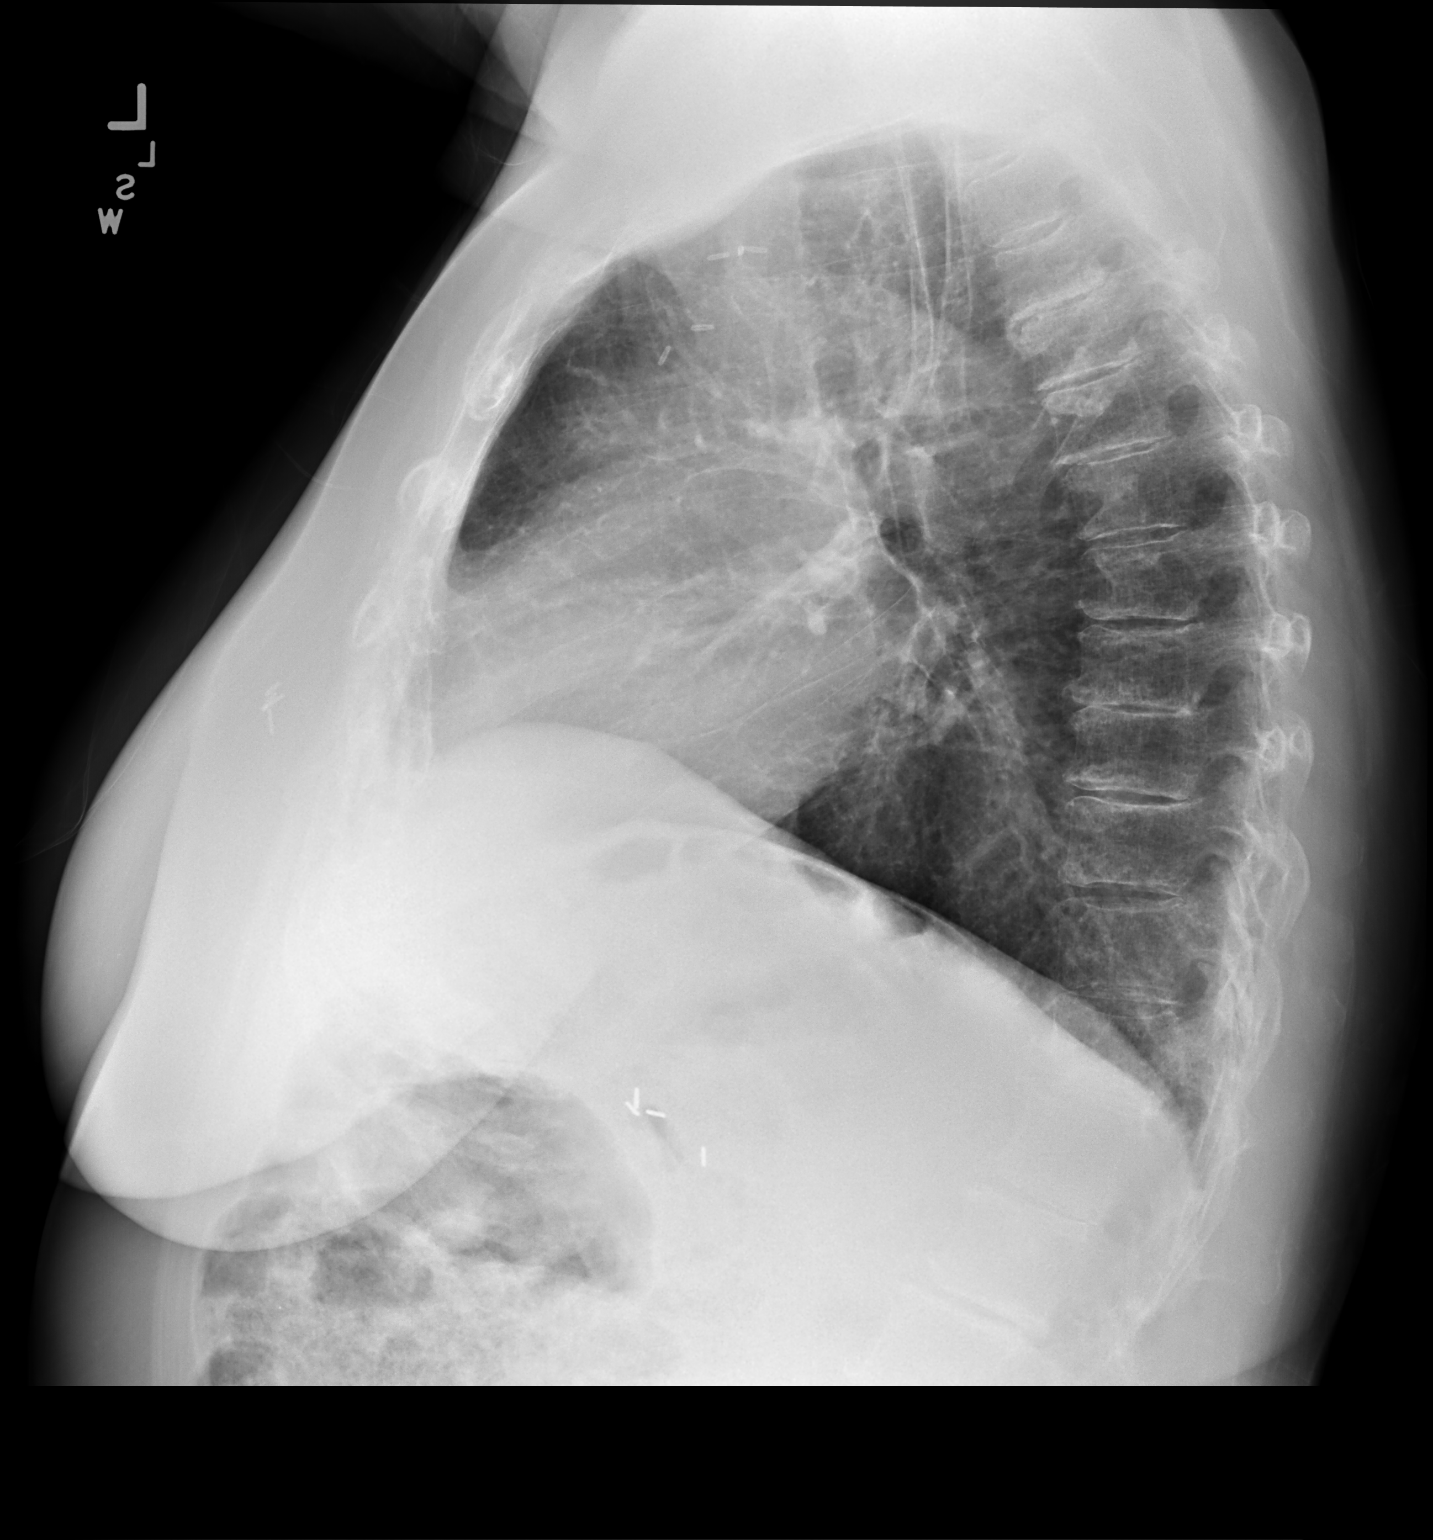

[2 of 2 positions shown; findings below may reference images not displayed]

FINDINGS: Cholecystectomy clips are seen in the right upper quadrant of the
abdomen. Stable cardiomediastinal silhouette with normal heart size.
No pneumothorax. No pleural effusion. Lungs appear clear, with no
acute consolidative airspace disease and no pulmonary edema.
Surgical clips noted in the right axilla and outer right breast.
IMPRESSION: No active cardiopulmonary disease.

## 2022-06-22 ENCOUNTER — Other Ambulatory Visit: Payer: Self-pay | Admitting: Internal Medicine

## 2022-06-22 DIAGNOSIS — I10 Essential (primary) hypertension: Secondary | ICD-10-CM

## 2022-06-27 DIAGNOSIS — M4722 Other spondylosis with radiculopathy, cervical region: Secondary | ICD-10-CM | POA: Diagnosis not present

## 2022-06-27 DIAGNOSIS — G894 Chronic pain syndrome: Secondary | ICD-10-CM | POA: Diagnosis not present

## 2022-06-27 DIAGNOSIS — M5136 Other intervertebral disc degeneration, lumbar region: Secondary | ICD-10-CM | POA: Diagnosis not present

## 2022-06-27 DIAGNOSIS — M5416 Radiculopathy, lumbar region: Secondary | ICD-10-CM | POA: Diagnosis not present

## 2022-07-19 DIAGNOSIS — H401122 Primary open-angle glaucoma, left eye, moderate stage: Secondary | ICD-10-CM | POA: Diagnosis not present

## 2022-07-25 DIAGNOSIS — M4722 Other spondylosis with radiculopathy, cervical region: Secondary | ICD-10-CM | POA: Diagnosis not present

## 2022-07-25 DIAGNOSIS — M5416 Radiculopathy, lumbar region: Secondary | ICD-10-CM | POA: Diagnosis not present

## 2022-07-25 DIAGNOSIS — G894 Chronic pain syndrome: Secondary | ICD-10-CM | POA: Diagnosis not present

## 2022-07-25 DIAGNOSIS — M5136 Other intervertebral disc degeneration, lumbar region: Secondary | ICD-10-CM | POA: Diagnosis not present

## 2022-08-15 ENCOUNTER — Other Ambulatory Visit: Payer: Self-pay | Admitting: Internal Medicine

## 2022-08-15 ENCOUNTER — Other Ambulatory Visit: Payer: Self-pay | Admitting: Family

## 2022-08-20 ENCOUNTER — Other Ambulatory Visit: Payer: Medicare Other

## 2022-08-22 DIAGNOSIS — G894 Chronic pain syndrome: Secondary | ICD-10-CM | POA: Diagnosis not present

## 2022-08-22 DIAGNOSIS — M4722 Other spondylosis with radiculopathy, cervical region: Secondary | ICD-10-CM | POA: Diagnosis not present

## 2022-08-22 DIAGNOSIS — M5416 Radiculopathy, lumbar region: Secondary | ICD-10-CM | POA: Diagnosis not present

## 2022-08-22 DIAGNOSIS — M5136 Other intervertebral disc degeneration, lumbar region: Secondary | ICD-10-CM | POA: Diagnosis not present

## 2022-08-24 ENCOUNTER — Encounter: Payer: Self-pay | Admitting: Internal Medicine

## 2022-08-24 ENCOUNTER — Ambulatory Visit (INDEPENDENT_AMBULATORY_CARE_PROVIDER_SITE_OTHER): Payer: Medicare Other | Admitting: Internal Medicine

## 2022-08-24 DIAGNOSIS — E7800 Pure hypercholesterolemia, unspecified: Secondary | ICD-10-CM | POA: Diagnosis not present

## 2022-08-24 DIAGNOSIS — I10 Essential (primary) hypertension: Secondary | ICD-10-CM

## 2022-08-24 DIAGNOSIS — K219 Gastro-esophageal reflux disease without esophagitis: Secondary | ICD-10-CM

## 2022-08-24 DIAGNOSIS — F439 Reaction to severe stress, unspecified: Secondary | ICD-10-CM

## 2022-08-24 DIAGNOSIS — H539 Unspecified visual disturbance: Secondary | ICD-10-CM

## 2022-08-24 DIAGNOSIS — E538 Deficiency of other specified B group vitamins: Secondary | ICD-10-CM | POA: Diagnosis not present

## 2022-08-24 DIAGNOSIS — Z853 Personal history of malignant neoplasm of breast: Secondary | ICD-10-CM | POA: Diagnosis not present

## 2022-08-24 DIAGNOSIS — D649 Anemia, unspecified: Secondary | ICD-10-CM

## 2022-08-24 DIAGNOSIS — E041 Nontoxic single thyroid nodule: Secondary | ICD-10-CM

## 2022-08-24 DIAGNOSIS — E1165 Type 2 diabetes mellitus with hyperglycemia: Secondary | ICD-10-CM

## 2022-08-24 LAB — LIPID PANEL
Cholesterol: 136 mg/dL (ref 0–200)
HDL: 50.6 mg/dL (ref >39.00)
LDL Cholesterol: 69 mg/dL (ref 0–99)
NonHDL: 85.32
Total CHOL/HDL Ratio: 3
Triglycerides: 83 mg/dL (ref 0.0–149.0)
VLDL: 16.6 mg/dL (ref 0.0–40.0)

## 2022-08-24 LAB — MICROALBUMIN / CREATININE URINE RATIO
Creatinine,U: 85.9 mg/dL
Microalb Creat Ratio: 0.8 mg/g (ref 0.0–30.0)
Microalb, Ur: <0.7 mg/dL (ref 0.0–1.9)

## 2022-08-24 LAB — BASIC METABOLIC PANEL
BUN: 13 mg/dL (ref 6–23)
CO2: 29 mEq/L (ref 19–32)
Calcium: 9.4 mg/dL (ref 8.4–10.5)
Chloride: 103 mEq/L (ref 96–112)
Creatinine, Ser: 0.63 mg/dL (ref 0.40–1.20)
GFR: 86.14 mL/min (ref >60.00)
Glucose, Bld: 108 mg/dL — ABNORMAL HIGH (ref 70–99)
Potassium: 4.3 mEq/L (ref 3.5–5.1)
Sodium: 140 mEq/L (ref 135–145)

## 2022-08-24 LAB — HEPATIC FUNCTION PANEL
ALT: 15 U/L (ref 0–35)
AST: 14 U/L (ref 0–37)
Albumin: 4.4 g/dL (ref 3.5–5.2)
Alkaline Phosphatase: 73 U/L (ref 39–117)
Bilirubin, Direct: 0.2 mg/dL (ref 0.0–0.3)
Total Bilirubin: 0.9 mg/dL (ref 0.2–1.2)
Total Protein: 6.5 g/dL (ref 6.0–8.3)

## 2022-08-24 LAB — TSH: TSH: 0.85 u[IU]/mL (ref 0.35–5.50)

## 2022-08-24 LAB — HEMOGLOBIN A1C: Hgb A1c MFr Bld: 6.4 % (ref 4.6–6.5)

## 2022-08-24 MED ORDER — CYANOCOBALAMIN 1000 MCG/ML IJ SOLN
1000.0000 ug | Freq: Once | INTRAMUSCULAR | Status: AC
  Administered 2022-08-24: 1000 ug via INTRAMUSCULAR

## 2022-08-24 NOTE — Progress Notes (Unsigned)
Patient ID: Sherry Simon, female   DOB: 04-Aug-1946, 76 y.o.   MRN: [id number]   Subjective:    Patient ID: Sherry Simon, female    DOB: 10-22-1945, 76 y.o.   MRN: [id number]   Patient here for No chief complaint on file.  Marland Kitchen   HPI Here for follow up appt.    Past Medical History:  Diagnosis Date   Anxiety and depression    Colon polyps    Depression    Diabetes mellitus ([place])    Endometriosis    requiring hysterectomy   History of chicken pox    Hypercholesterolemia    Hypertension    Nephrolithiasis    Pericarditis    recurrent, unkown origin   Tachycardia    Past Surgical History:  Procedure Laterality Date   ABDOMINAL HYSTERECTOMY  1980   APPENDECTOMY  19   BACK SURGERY  1005-2010   laminectomy   CHOLECYSTECTOMY     open   OOPHORECTOMY  1982   TONSILLECTOMY     Family History  Problem Relation Age of Onset   Heart disease Father        myocardial infarction - died 97   Thyroid disease Mother    Transient ischemic attack Mother        multiple   Breast cancer Mother    Hyperlipidemia Mother    Kidney disease Mother    Diabetes Mother    Rheumatic fever Sister        mitral valve problems   Breast cancer Paternal Aunt    Other Sister        Small vessel disease   Colon cancer Neg Hx    Social History   Socioeconomic History   Marital status: Married    Spouse name: Not on file   Number of children: 3   Years of education: Not on file   Highest education level: Not on file  Occupational History   Not on file  Tobacco Use   Smoking status: Never   Smokeless tobacco: Never  Vaping Use   Vaping Use: Never used  Substance and Sexual Activity   Alcohol use: No    Alcohol/week: 0.0 standard drinks of alcohol   Drug use: No   Sexual activity: Never  Other Topics Concern   Not on file  Social History Narrative   Not on file   Social Determinants of Health   Financial Resource Strain: Low Risk  (12/26/2021)   Overall Financial  Resource Strain (CARDIA)    Difficulty of Paying Living Expenses: Not hard at all  Food Insecurity: No Food Insecurity (12/26/2021)   Hunger Vital Sign    Worried About Running Out of Food in the Last Year: Never true    Ran Out of Food in the Last Year: Never true  Transportation Needs: No Transportation Needs (12/26/2021)   PRAPARE - Hydrologist (Medical): No    Lack of Transportation (Non-Medical): No  Physical Activity: Unknown (07/14/2019)   Exercise Vital Sign    Days of Exercise per Week: 0 days    Minutes of Exercise per Session: Not on file  Stress: No Stress Concern Present (12/26/2021)   [place]    Feeling of Stress : Not at all  Social Connections: Unknown (12/26/2021)   Social Connection and Isolation Panel [NHANES]    Frequency of Communication with Friends and Family: Not on file    Frequency  of Social Gatherings with Friends and Family: Not on file    Attends Religious Services: Not on file    Active Member of Clubs or Organizations: Not on file    Attends Archivist Meetings: Not on file    Marital Status: Married     Review of Systems     Objective:     There were no vitals taken for this visit. Wt Readings from Last 3 Encounters:  04/24/22 161 lb 12.8 oz (73.4 kg)  12/26/21 161 lb (73 kg)  12/13/21 161 lb 9.6 oz (73.3 kg)    Physical Exam   Outpatient Encounter Medications as of 08/24/2022  Medication Sig   acyclovir (ZOVIRAX) 400 MG tablet TAKE 1 TABLET BY MOUTH  DAILY   aspirin 81 MG EC tablet Take by mouth.   atorvastatin (LIPITOR) 40 MG tablet TAKE 1 TABLET BY MOUTH DAILY   Calcium Carbonate-Vitamin D 600-400 MG-UNIT tablet Take by mouth.   citalopram (CELEXA) 40 MG tablet TAKE 1 TABLET BY MOUTH DAILY   clobetasol ointment (TEMOVATE) 0.05 % Apply pea size amount daily x 4 weeks, then every other day x 4 weeks, then 2-3 times weekly for  maintenance   ferrous sulfate 325 (65 FE) MG EC tablet Take 325 mg by mouth daily with breakfast.    furosemide (LASIX) 20 MG tablet TAKE 1 TABLET(20 MG) BY MOUTH DAILY AS NEEDED   gabapentin (NEURONTIN) 600 MG tablet TAKE 2 TABLETS BY MOUTH IN THE  MORNING AND 3 TABLETS BY MOUTH  IN THE EVENING   glucose blood (ONE TOUCH ULTRA TEST) test strip TEST BLOOD SUGAR TWICE A DAY   HYDROcodone-acetaminophen (NORCO) 10-325 MG tablet TAKE 1 TABLET BY MOUTH THREE TIMES DAILY AS NEEDED FOR CHRONIC PAIN   latanoprost (XALATAN) 0.005 % ophthalmic solution INSTILL 1 DROP INTO THE  LEFT EYE AT BEDTIME  (REFRIGERATE UNTIL FIRST  OPENED FOR USE) (Patient taking differently: Place 1 drop into both eyes.)   Melatonin 5 MG CAPS Take by mouth.   metFORMIN (GLUCOPHAGE) 500 MG tablet TAKE 1 TABLET BY MOUTH  TWICE DAILY WITH A MEAL   nystatin cream (MYCOSTATIN) Apply 1 application topically 2 (two) times daily.   omeprazole (PRILOSEC) 40 MG capsule TAKE 1 CAPSULE BY MOUTH  DAILY   No facility-administered encounter medications on file as of 08/24/2022.     Lab Results  Component Value Date   WBC 4.1 10/05/2021   HGB 12.7 10/05/2021   HCT 38.2 10/05/2021   PLT 295.0 10/05/2021   GLUCOSE 96 04/17/2022   CHOL 148 04/17/2022   TRIG 92.0 04/17/2022   HDL 51.00 04/17/2022   LDLCALC 78 04/17/2022   ALT 12 04/17/2022   AST 13 04/17/2022   NA 139 04/17/2022   K 4.5 04/17/2022   CL 102 04/17/2022   CREATININE 0.62 04/17/2022   BUN 10 04/17/2022   CO2 28 04/17/2022   TSH 1.42 10/05/2021   HGBA1C 6.4 04/17/2022   MICROALBUR 1.1 10/12/2020       Assessment & Plan:   Problem List Items Addressed This Visit   None    Einar Pheasant, MD

## 2022-08-25 ENCOUNTER — Encounter: Payer: Self-pay | Admitting: Internal Medicine

## 2022-08-25 DIAGNOSIS — H539 Unspecified visual disturbance: Secondary | ICD-10-CM | POA: Insufficient documentation

## 2022-08-25 NOTE — Assessment & Plan Note (Signed)
Continue b12 injections.  

## 2022-08-25 NOTE — Assessment & Plan Note (Signed)
Increased stress.  Discussed.  Continues on citalopram.  Does not feel needs any further intervention at this time. Follow.

## 2022-08-25 NOTE — Assessment & Plan Note (Signed)
Not on any medication currently.  Has lasix to take prn.  Blood pressure as outlined.  Follow pressures.  Follow metabolic panel.

## 2022-08-25 NOTE — Assessment & Plan Note (Signed)
Followed by oncology.  Mammogram 03/08/22 - Birads II.

## 2022-08-25 NOTE — Assessment & Plan Note (Signed)
Vision change as outlined.  Discussed concern regarding possible TIA.  Episode occurred in 04/2022.  Resolved.  No further problems or change.  Taking aspirin.  Was instructed to continue.  Obtain carotid ultrasound and MRI brain. Further w/up pending.

## 2022-08-25 NOTE — Assessment & Plan Note (Signed)
Continue lipitor.  Low cholesterol diet and exercise.  Follow lipid panel and liver function tests.   

## 2022-08-25 NOTE — Assessment & Plan Note (Signed)
S/p previous biopsy.  Follow tsh.

## 2022-08-25 NOTE — Assessment & Plan Note (Signed)
Follow [facility].  Colonoscopy 2018.  Recommended f/u in 5 years. Due this year. Discussed.  Will notify me when agreeable for referral.

## 2022-08-25 NOTE — Assessment & Plan Note (Signed)
Recently off prilosec and took protonix.  Flare of symptoms as outlined.  Regurgitation.  Back on prilosec now.  Doing better.  Discussed f/u with GI and question of need for endoscopy given increased symptoms after of PPI for short period of time.

## 2022-08-25 NOTE — Assessment & Plan Note (Signed)
Low carb diet and exercise given elevated sugars.   Continues on metformin.  Follow met b and a1c.   

## 2022-08-30 DIAGNOSIS — H4422 Degenerative myopia, left eye: Secondary | ICD-10-CM | POA: Diagnosis not present

## 2022-08-30 DIAGNOSIS — E119 Type 2 diabetes mellitus without complications: Secondary | ICD-10-CM | POA: Diagnosis not present

## 2022-08-30 DIAGNOSIS — H43813 Vitreous degeneration, bilateral: Secondary | ICD-10-CM | POA: Diagnosis not present

## 2022-08-30 DIAGNOSIS — H35052 Retinal neovascularization, unspecified, left eye: Secondary | ICD-10-CM | POA: Diagnosis not present

## 2022-08-30 DIAGNOSIS — H35372 Puckering of macula, left eye: Secondary | ICD-10-CM | POA: Diagnosis not present

## 2022-08-30 DIAGNOSIS — H3322 Serous retinal detachment, left eye: Secondary | ICD-10-CM | POA: Diagnosis not present

## 2022-09-04 ENCOUNTER — Ambulatory Visit
Admission: RE | Admit: 2022-09-04 | Discharge: 2022-09-04 | Disposition: A | Discharge status: Home / Self Care | Payer: Medicare Other | Source: Ambulatory Visit | Attending: Internal Medicine | Admitting: Internal Medicine

## 2022-09-04 DIAGNOSIS — H539 Unspecified visual disturbance: Secondary | ICD-10-CM | POA: Diagnosis not present

## 2022-09-04 DIAGNOSIS — R42 Dizziness and giddiness: Secondary | ICD-10-CM | POA: Diagnosis not present

## 2022-09-04 MED ORDER — GADOBUTROL 1 MMOL/ML IV SOLN
7.0000 mL | Freq: Once | INTRAVENOUS | Status: AC | PRN
  Administered 2022-09-04: 7 mL via INTRAVENOUS

## 2022-09-07 ENCOUNTER — Other Ambulatory Visit: Payer: Self-pay

## 2022-09-07 MED ORDER — ACYCLOVIR 400 MG PO TABS: ORAL_TABLET | ORAL | 3 refills | Status: DC

## 2022-09-12 ENCOUNTER — Encounter (INDEPENDENT_AMBULATORY_CARE_PROVIDER_SITE_OTHER): Payer: Self-pay

## 2022-09-19 ENCOUNTER — Encounter (INDEPENDENT_AMBULATORY_CARE_PROVIDER_SITE_OTHER): Payer: Medicare Other

## 2022-09-19 ENCOUNTER — Other Ambulatory Visit: Payer: Self-pay | Admitting: Internal Medicine

## 2022-09-19 DIAGNOSIS — G894 Chronic pain syndrome: Secondary | ICD-10-CM | POA: Diagnosis not present

## 2022-09-20 ENCOUNTER — Ambulatory Visit (INDEPENDENT_AMBULATORY_CARE_PROVIDER_SITE_OTHER): Payer: Medicare Other

## 2022-09-20 DIAGNOSIS — H539 Unspecified visual disturbance: Secondary | ICD-10-CM | POA: Diagnosis not present

## 2022-09-22 DIAGNOSIS — S93402A Sprain of unspecified ligament of left ankle, initial encounter: Secondary | ICD-10-CM | POA: Diagnosis not present

## 2022-10-04 DIAGNOSIS — R0781 Pleurodynia: Secondary | ICD-10-CM | POA: Diagnosis not present

## 2022-10-04 DIAGNOSIS — Y92009 Unspecified place in unspecified non-institutional (private) residence as the place of occurrence of the external cause: Secondary | ICD-10-CM | POA: Diagnosis not present

## 2022-10-04 DIAGNOSIS — W19XXXA Unspecified fall, initial encounter: Secondary | ICD-10-CM | POA: Diagnosis not present

## 2022-10-16 DIAGNOSIS — L900 Lichen sclerosus et atrophicus: Secondary | ICD-10-CM | POA: Diagnosis not present

## 2022-10-24 DIAGNOSIS — G894 Chronic pain syndrome: Secondary | ICD-10-CM | POA: Diagnosis not present

## 2022-10-24 DIAGNOSIS — M5416 Radiculopathy, lumbar region: Secondary | ICD-10-CM | POA: Diagnosis not present

## 2022-10-24 DIAGNOSIS — M25561 Pain in right knee: Secondary | ICD-10-CM | POA: Diagnosis not present

## 2022-10-24 DIAGNOSIS — M25551 Pain in right hip: Secondary | ICD-10-CM | POA: Diagnosis not present

## 2022-10-24 DIAGNOSIS — M5136 Other intervertebral disc degeneration, lumbar region: Secondary | ICD-10-CM | POA: Diagnosis not present

## 2022-10-24 DIAGNOSIS — T1490XA Injury, unspecified, initial encounter: Secondary | ICD-10-CM | POA: Diagnosis not present

## 2022-10-24 DIAGNOSIS — M4722 Other spondylosis with radiculopathy, cervical region: Secondary | ICD-10-CM | POA: Diagnosis not present

## 2022-11-06 DIAGNOSIS — M160 Bilateral primary osteoarthritis of hip: Secondary | ICD-10-CM | POA: Diagnosis not present

## 2022-11-06 DIAGNOSIS — M7601 Gluteal tendinitis, right hip: Secondary | ICD-10-CM | POA: Diagnosis not present

## 2022-11-06 DIAGNOSIS — S76311A Strain of muscle, fascia and tendon of the posterior muscle group at thigh level, right thigh, initial encounter: Secondary | ICD-10-CM | POA: Diagnosis not present

## 2022-11-26 ENCOUNTER — Encounter: Payer: Self-pay | Admitting: Internal Medicine

## 2022-11-26 ENCOUNTER — Ambulatory Visit (INDEPENDENT_AMBULATORY_CARE_PROVIDER_SITE_OTHER): Payer: Medicare Other | Admitting: Internal Medicine

## 2022-11-26 VITALS — BP 112/70 | HR 81 | Temp 98.3°F | Resp 16 | Ht 64.0 in | Wt 154.0 lb

## 2022-11-26 DIAGNOSIS — Z853 Personal history of malignant neoplasm of breast: Secondary | ICD-10-CM

## 2022-11-26 DIAGNOSIS — F439 Reaction to severe stress, unspecified: Secondary | ICD-10-CM

## 2022-11-26 DIAGNOSIS — H539 Unspecified visual disturbance: Secondary | ICD-10-CM

## 2022-11-26 DIAGNOSIS — M549 Dorsalgia, unspecified: Secondary | ICD-10-CM

## 2022-11-26 DIAGNOSIS — E1165 Type 2 diabetes mellitus with hyperglycemia: Secondary | ICD-10-CM

## 2022-11-26 DIAGNOSIS — I10 Essential (primary) hypertension: Secondary | ICD-10-CM | POA: Diagnosis not present

## 2022-11-26 DIAGNOSIS — D649 Anemia, unspecified: Secondary | ICD-10-CM | POA: Diagnosis not present

## 2022-11-26 DIAGNOSIS — G8929 Other chronic pain: Secondary | ICD-10-CM

## 2022-11-26 DIAGNOSIS — E041 Nontoxic single thyroid nodule: Secondary | ICD-10-CM | POA: Diagnosis not present

## 2022-11-26 DIAGNOSIS — E7800 Pure hypercholesterolemia, unspecified: Secondary | ICD-10-CM | POA: Diagnosis not present

## 2022-11-26 DIAGNOSIS — W19XXXS Unspecified fall, sequela: Secondary | ICD-10-CM

## 2022-11-26 LAB — HEPATIC FUNCTION PANEL
ALT: 11 U/L (ref 0–35)
AST: 12 U/L (ref 0–37)
Albumin: 4.2 g/dL (ref 3.5–5.2)
Alkaline Phosphatase: 93 U/L (ref 39–117)
Bilirubin, Direct: 0.2 mg/dL (ref 0.0–0.3)
Total Bilirubin: 1 mg/dL (ref 0.2–1.2)
Total Protein: 6.5 g/dL (ref 6.0–8.3)

## 2022-11-26 LAB — LIPID PANEL
Cholesterol: 153 mg/dL (ref 0–200)
HDL: 53.4 mg/dL (ref >39.00)
LDL Cholesterol: 69 mg/dL (ref 0–99)
NonHDL: 99.19
Total CHOL/HDL Ratio: 3
Triglycerides: 151 mg/dL — ABNORMAL HIGH (ref 0.0–149.0)
VLDL: 30.2 mg/dL (ref 0.0–40.0)

## 2022-11-26 LAB — BASIC METABOLIC PANEL
BUN: 13 mg/dL (ref 6–23)
CO2: 27 mEq/L (ref 19–32)
Calcium: 9.4 mg/dL (ref 8.4–10.5)
Chloride: 104 mEq/L (ref 96–112)
Creatinine, Ser: 0.6 mg/dL (ref 0.40–1.20)
GFR: 87 mL/min (ref >60.00)
Glucose, Bld: 78 mg/dL (ref 70–99)
Potassium: 4.4 mEq/L (ref 3.5–5.1)
Sodium: 140 mEq/L (ref 135–145)

## 2022-11-26 LAB — HM DIABETES FOOT EXAM

## 2022-11-26 LAB — HEMOGLOBIN A1C: Hgb A1c MFr Bld: 6.4 % (ref 4.6–6.5)

## 2022-11-26 NOTE — Progress Notes (Signed)
Subjective:    Patient ID: Sherry Simon, female    DOB: 1945-10-31, 77 y.o.   MRN: [id number]  Patient here for  Chief Complaint  Patient presents with   Medical Management of Chronic Issues    HPI Here for follow up appt - f/u regarding her blood sugars, blood pressure and cholesterol.  Has chronic back/leg pain.  Followed by [facility]. Has had two falls.  First 12/29 - [facility] - left ankle sprain.  Still some discomfort - walking.  Lying down - mild to lateral foot/ankle - pressure.  Second fall - ribs - pain.  Discussed.  F/u ortho.  No chest pain reported.  No increased cough or congestion.  Epigastric pain - better.  Feel full quick. No dysphagia - food.  Occasional pill dysphagia.  Increased pain - right hip and upper thigh pain.  First get up - increased pain.  Small tear - gluteus muscle.  States "cyst" present.  Plans to discuss with her back MD.  Recently had MRI brain - vision change.  No evidence of acute intracranial abnormality. Mild chronic small vessel ischemic changes within the cerebral white matter. Has macular degeneration.  Noticed - wavy/blurred - has noticed this when fluid present.  Plans to f/u with ophthalmology.  Increased stress.  Discussed.     Past Medical History:  Diagnosis Date   Anxiety and depression    Colon polyps    Depression    Diabetes mellitus ([place])    Endometriosis    requiring hysterectomy   History of chicken pox    Hypercholesterolemia    Hypertension    Nephrolithiasis    Pericarditis    recurrent, unkown origin   Tachycardia    Past Surgical History:  Procedure Laterality Date   ABDOMINAL HYSTERECTOMY  1980   APPENDECTOMY  77   BACK SURGERY  1005-2010   laminectomy   CHOLECYSTECTOMY     open   OOPHORECTOMY  1982   TONSILLECTOMY     Family History  Problem Relation Age of Onset   Heart disease Father        myocardial infarction - died 51   Thyroid disease Mother    Transient ischemic attack Mother        multiple    Breast cancer Mother    Hyperlipidemia Mother    Kidney disease Mother    Diabetes Mother    Rheumatic fever Sister        mitral valve problems   Breast cancer Paternal Aunt    Other Sister        Small vessel disease   Colon cancer Neg Hx    Social History   Socioeconomic History   Marital status: Married    Spouse name: Not on file   Number of children: 3   Years of education: Not on file   Highest education level: Not on file  Occupational History   Not on file  Tobacco Use   Smoking status: Never   Smokeless tobacco: Never  Vaping Use   Vaping Use: Never used  Substance and Sexual Activity   Alcohol use: No    Alcohol/week: 0.0 standard drinks of alcohol   Drug use: No   Sexual activity: Never  Other Topics Concern   Not on file  Social History Narrative   Not on file   Social Determinants of Health   Financial Resource Strain: Low Risk  (12/26/2021)   Overall Financial Resource Strain (CARDIA)    Difficulty  of Paying Living Expenses: Not hard at all  Food Insecurity: No Food Insecurity (12/26/2021)   Hunger Vital Sign    Worried About Running Out of Food in the Last Year: Never true    Ran Out of Food in the Last Year: Never true  Transportation Needs: No Transportation Needs (12/26/2021)   PRAPARE - Hydrologist (Medical): No    Lack of Transportation (Non-Medical): No  Physical Activity: Unknown (07/14/2019)   Exercise Vital Sign    Days of Exercise per Week: 0 days    Minutes of Exercise per Session: Not on file  Stress: No Stress Concern Present (12/26/2021)   [place]    Feeling of Stress : Not at all  Social Connections: Unknown (12/26/2021)   Social Connection and Isolation Panel [NHANES]    Frequency of Communication with Friends and Family: Not on file    Frequency of Social Gatherings with Friends and Family: Not on file    Attends Religious Services: Not  on file    Active Member of Clubs or Organizations: Not on file    Attends Archivist Meetings: Not on file    Marital Status: Married     Review of Systems  Constitutional:  Positive for fatigue. Negative for appetite change and unexpected weight change.  HENT:  Negative for congestion and sinus pressure.   Respiratory:  Negative for cough, chest tightness and shortness of breath.   Cardiovascular:  Negative for chest pain and palpitations.  Gastrointestinal:  Negative for abdominal pain, diarrhea, nausea and vomiting.  Genitourinary:  Negative for difficulty urinating and dysuria.  Musculoskeletal:  Positive for back pain. Negative for myalgias.  Skin:  Negative for color change and rash.  Neurological:  Negative for dizziness and headaches.  Psychiatric/Behavioral:  Negative for agitation and dysphoric mood.        Increased stress as outlined.        Objective:     BP 112/70   Pulse 81   Temp 98.3 F (36.8 C)   Resp 16   Ht '5\' 4"'$  (1.626 m)   Wt 154 lb (69.9 kg)   SpO2 98%   BMI 26.43 kg/m  Wt Readings from Last 3 Encounters:  11/26/22 154 lb (69.9 kg)  08/24/22 158 lb 12.8 oz (72 kg)  04/24/22 161 lb 12.8 oz (73.4 kg)    Physical Exam Vitals reviewed.  Constitutional:      General: She is not in acute distress.    Appearance: Normal appearance.  HENT:     Head: Normocephalic and atraumatic.     Right Ear: External ear normal.     Left Ear: External ear normal.  Eyes:     General: No scleral icterus.       Right eye: No discharge.        Left eye: No discharge.     Conjunctiva/sclera: Conjunctivae normal.  Neck:     Thyroid: No thyromegaly.  Cardiovascular:     Rate and Rhythm: Normal rate and regular rhythm.  Pulmonary:     Effort: No respiratory distress.     Breath sounds: Normal breath sounds. No wheezing.  Abdominal:     General: Bowel sounds are normal.     Palpations: Abdomen is soft.     Tenderness: There is no abdominal  tenderness.  Musculoskeletal:        General: No swelling or tenderness.  Cervical back: Neck supple. No tenderness.  Lymphadenopathy:     Cervical: No cervical adenopathy.  Skin:    Findings: No erythema or rash.  Neurological:     Mental Status: She is alert.  Psychiatric:        Mood and Affect: Mood normal.        Behavior: Behavior normal.      Outpatient Encounter Medications as of 11/26/2022  Medication Sig   acyclovir (ZOVIRAX) 400 MG tablet TAKE 1 TABLET BY MOUTH  DAILY   aspirin 81 MG EC tablet Take by mouth.   atorvastatin (LIPITOR) 40 MG tablet TAKE 1 TABLET BY MOUTH DAILY   Calcium Carbonate-Vitamin D 600-400 MG-UNIT tablet Take by mouth.   citalopram (CELEXA) 40 MG tablet TAKE 1 TABLET BY MOUTH DAILY   clobetasol ointment (TEMOVATE) 0.05 % Apply pea size amount daily x 4 weeks, then every other day x 4 weeks, then 2-3 times weekly for maintenance   ferrous sulfate 325 (65 FE) MG EC tablet Take 325 mg by mouth daily with breakfast.    gabapentin (NEURONTIN) 600 MG tablet TAKE 2 TABLETS BY MOUTH IN THE  MORNING AND 3 TABLETS BY MOUTH  IN THE EVENING   glucose blood (ONE TOUCH ULTRA TEST) test strip TEST BLOOD SUGAR TWICE A DAY   HYDROcodone-acetaminophen (NORCO) 10-325 MG tablet TAKE 1 TABLET BY MOUTH THREE TIMES DAILY AS NEEDED FOR CHRONIC PAIN   latanoprost (XALATAN) 0.005 % ophthalmic solution INSTILL 1 DROP INTO THE  LEFT EYE AT BEDTIME  (REFRIGERATE UNTIL FIRST  OPENED FOR USE) (Patient taking differently: Place 1 drop into both eyes.)   Melatonin 5 MG CAPS Take by mouth.   metFORMIN (GLUCOPHAGE) 500 MG tablet TAKE 1 TABLET BY MOUTH  TWICE DAILY WITH A MEAL   nystatin cream (MYCOSTATIN) Apply 1 application topically 2 (two) times daily.   omeprazole (PRILOSEC) 40 MG capsule TAKE 1 CAPSULE BY MOUTH DAILY   [DISCONTINUED] furosemide (LASIX) 20 MG tablet TAKE 1 TABLET(20 MG) BY MOUTH DAILY AS NEEDED   No facility-administered encounter medications on file as of  11/26/2022.     Lab Results  Component Value Date   WBC 4.6 11/26/2022   HGB 12.8 11/26/2022   HCT 38.1 11/26/2022   PLT 318.0 11/26/2022   GLUCOSE 78 11/26/2022   CHOL 153 11/26/2022   TRIG 151.0 (H) 11/26/2022   HDL 53.40 11/26/2022   LDLCALC 69 11/26/2022   ALT 11 11/26/2022   AST 12 11/26/2022   NA 140 11/26/2022   K 4.4 11/26/2022   CL 104 11/26/2022   CREATININE 0.60 11/26/2022   BUN 13 11/26/2022   CO2 27 11/26/2022   TSH 0.85 08/24/2022   HGBA1C 6.4 11/26/2022   MICROALBUR <0.7 08/24/2022    MR Brain W Wo Contrast  Result Date: 09/04/2022 CLINICAL DATA:  Provided history: Change in vision. Transient ischemic attack. Additional history provided by scanning technologist: Suspected TIA in September 2023 (sharp headache with loss of vision in left eye for several seconds followed by dizziness). EXAM: MRI HEAD WITHOUT AND WITH CONTRAST TECHNIQUE: Multiplanar, multiecho pulse sequences of the brain and surrounding structures were obtained without and with intravenous contrast. CONTRAST:  28m GADAVIST GADOBUTROL 1 MMOL/ML IV SOLN COMPARISON:  Head CT 01/24/2009. FINDINGS: Brain: No age advanced or lobar predominant parenchymal atrophy. Multifocal T2 FLAIR hyperintense signal abnormality within the cerebral white matter, nonspecific but compatible with mild chronic small vessel ischemic disease. There is no acute infarct. No evidence of an  intracranial mass. No extra-axial fluid collection. No midline shift. No pathologic intracranial enhancement identified. Vascular: Maintained flow voids within the proximal large arterial vessels. Skull and upper cervical spine: No focal suspicious marrow lesion. Sinuses/Orbits: No mass or acute finding within the imaged orbits. Prior bilateral ocular lens replacement. Trace mucosal thickening within the bilateral ethmoid air cells. IMPRESSION: No evidence of acute intracranial abnormality. Mild chronic small vessel ischemic changes within the cerebral  white matter. Electronically Signed   By: Kellie Simmering D.O.   On: 09/04/2022 18:16       Assessment & Plan:  Type 2 diabetes mellitus with hyperglycemia, without long-term current use of insulin (HCC) Assessment & Plan: Low carb diet and exercise given elevated sugars.   Continues on metformin.  Follow met b and a1c.    Orders: -     Hemoglobin A1c  Hypercholesterolemia Assessment & Plan: Continue lipitor.  Low cholesterol diet and exercise.  Follow lipid panel and liver function tests.    Orders: -     Lipid panel -     Hepatic function panel  Primary hypertension Assessment & Plan: Not on any medication currently.  Blood pressure as outlined.  Follow pressures.  Follow metabolic panel.    Orders: -     CBC with Differential/Platelet -     Basic metabolic panel  Anemia, unspecified type Assessment & Plan: Follow cbc.  Colonoscopy 2018.  Recommended f/u in 5 years. Overdue.  Have discussed.    Chronic midline back pain, unspecified back location Assessment & Plan: Increased pain (into leg).  Seeing [facility]pain clinic. Taking hydrocodone.  Continue f/u as outlined. Had MRI as outlined.  F/u ortho   Change in vision Assessment & Plan: F/u with ophthalmology.    History of breast cancer Assessment & Plan: Followed by oncology.  Mammogram 03/08/22 - Birads II.    Stress Assessment & Plan: Increased stress.  Discussed.  Continues on citalopram. Notify if feels needs any further intervention. Follow.    Thyroid nodule Assessment & Plan: S/p previous biopsy.  Follow tsh.    Fall, sequela Assessment & Plan: Two recent falls as outlined.  Slow position changes and movements.  Walker, etc - if needed.  F/u ortho.       Einar Pheasant, MD

## 2022-11-27 LAB — CBC WITH DIFFERENTIAL/PLATELET
Basophils Absolute: 0 10*3/uL (ref 0.0–0.1)
Basophils Relative: 0.4 % (ref 0.0–3.0)
Eosinophils Absolute: 0.1 10*3/uL (ref 0.0–0.7)
Eosinophils Relative: 2.1 % (ref 0.0–5.0)
HCT: 38.1 % (ref 36.0–46.0)
Hemoglobin: 12.8 g/dL (ref 12.0–15.0)
Lymphocytes Relative: 36.5 % (ref 12.0–46.0)
Lymphs Abs: 1.7 10*3/uL (ref 0.7–4.0)
MCHC: 33.7 g/dL (ref 30.0–36.0)
MCV: 93.3 fl (ref 78.0–100.0)
Monocytes Absolute: 0.4 10*3/uL (ref 0.1–1.0)
Monocytes Relative: 8.8 % (ref 3.0–12.0)
Neutro Abs: 2.4 10*3/uL (ref 1.4–7.7)
Neutrophils Relative %: 52.2 % (ref 43.0–77.0)
Platelets: 318 10*3/uL (ref 150.0–400.0)
RBC: 4.08 Mil/uL (ref 3.87–5.11)
RDW: 14.1 % (ref 11.5–15.5)
WBC: 4.6 10*3/uL (ref 4.0–10.5)

## 2022-11-29 DIAGNOSIS — H3322 Serous retinal detachment, left eye: Secondary | ICD-10-CM | POA: Diagnosis not present

## 2022-11-29 DIAGNOSIS — H35052 Retinal neovascularization, unspecified, left eye: Secondary | ICD-10-CM | POA: Diagnosis not present

## 2022-11-29 DIAGNOSIS — G453 Amaurosis fugax: Secondary | ICD-10-CM | POA: Diagnosis not present

## 2022-11-29 DIAGNOSIS — H4422 Degenerative myopia, left eye: Secondary | ICD-10-CM | POA: Diagnosis not present

## 2022-11-29 DIAGNOSIS — H43813 Vitreous degeneration, bilateral: Secondary | ICD-10-CM | POA: Diagnosis not present

## 2022-11-29 DIAGNOSIS — H35372 Puckering of macula, left eye: Secondary | ICD-10-CM | POA: Diagnosis not present

## 2022-11-29 DIAGNOSIS — E119 Type 2 diabetes mellitus without complications: Secondary | ICD-10-CM | POA: Diagnosis not present

## 2022-12-01 DIAGNOSIS — W19XXXA Unspecified fall, initial encounter: Secondary | ICD-10-CM | POA: Insufficient documentation

## 2022-12-01 NOTE — Assessment & Plan Note (Addendum)
Not on any medication currently.  Blood pressure as outlined.  Follow pressures.  Follow metabolic panel.

## 2022-12-01 NOTE — Assessment & Plan Note (Addendum)
F/u with ophthalmology

## 2022-12-01 NOTE — Assessment & Plan Note (Signed)
Low carb diet and exercise given elevated sugars.   Continues on metformin.  Follow met b and a1c.   

## 2022-12-01 NOTE — Assessment & Plan Note (Addendum)
Increased stress.  Discussed.  Continues on citalopram. Notify if feels needs any further intervention. Follow.

## 2022-12-01 NOTE — Assessment & Plan Note (Signed)
Followed by oncology.  Mammogram 03/08/22 - Birads II.  

## 2022-12-01 NOTE — Assessment & Plan Note (Signed)
Two recent falls as outlined.  Slow position changes and movements.  Walker, etc - if needed.  F/u ortho.

## 2022-12-01 NOTE — Assessment & Plan Note (Signed)
S/p previous biopsy.  Follow tsh.  

## 2022-12-01 NOTE — Assessment & Plan Note (Signed)
Continue lipitor.  Low cholesterol diet and exercise.  Follow lipid panel and liver function tests.   

## 2022-12-01 NOTE — Assessment & Plan Note (Signed)
Follow cbc.  Colonoscopy 2018.  Recommended f/u in 5 years. Overdue.  Have discussed.

## 2022-12-01 NOTE — Assessment & Plan Note (Signed)
Increased pain (into leg).  Seeing [facility]pain clinic. Taking hydrocodone.  Continue f/u as outlined. Had MRI as outlined.  F/u ortho

## 2022-12-12 DIAGNOSIS — H401122 Primary open-angle glaucoma, left eye, moderate stage: Secondary | ICD-10-CM | POA: Diagnosis not present

## 2022-12-19 DIAGNOSIS — M5136 Other intervertebral disc degeneration, lumbar region: Secondary | ICD-10-CM | POA: Diagnosis not present

## 2022-12-19 DIAGNOSIS — M25551 Pain in right hip: Secondary | ICD-10-CM | POA: Diagnosis not present

## 2022-12-19 DIAGNOSIS — M4722 Other spondylosis with radiculopathy, cervical region: Secondary | ICD-10-CM | POA: Diagnosis not present

## 2022-12-19 DIAGNOSIS — G894 Chronic pain syndrome: Secondary | ICD-10-CM | POA: Diagnosis not present

## 2022-12-19 DIAGNOSIS — M5416 Radiculopathy, lumbar region: Secondary | ICD-10-CM | POA: Diagnosis not present

## 2022-12-26 ENCOUNTER — Telehealth: Payer: Self-pay | Admitting: Internal Medicine

## 2022-12-26 NOTE — Telephone Encounter (Signed)
Contacted [facility] to schedule their annual wellness visit. Appointment made for 12/31/2022.  Thank you,  [place] Direct dial  [phone]

## 2022-12-31 ENCOUNTER — Ambulatory Visit (INDEPENDENT_AMBULATORY_CARE_PROVIDER_SITE_OTHER): Payer: Medicare Other

## 2022-12-31 VITALS — Ht 64.0 in | Wt 154.0 lb

## 2022-12-31 DIAGNOSIS — Z0000 Encounter for general adult medical examination without abnormal findings: Secondary | ICD-10-CM | POA: Diagnosis not present

## 2022-12-31 NOTE — Progress Notes (Signed)
Subjective:   Sherry Simon is a 77 y.o. female who presents for Medicare Annual (Subsequent) preventive examination.  Review of Systems    No ROS.  Medicare Wellness Virtual Visit.  Visual/audio telehealth visit, UTA vital signs.   See social history for additional risk factors.   Cardiac Risk Factors include: advanced age (>70men, >33 women);diabetes mellitus;hypertension     Objective:    Today's Vitals   12/31/22 1306  Weight: 154 lb (69.9 kg)  Height: 5\' 4"  (1.626 m)   Body mass index is 26.43 kg/m.     12/31/2022    1:13 PM 12/26/2021    3:51 PM 09/03/2019    3:40 PM 07/14/2019   10:56 AM 07/24/2017    5:43 PM 10/11/2015   11:35 AM  Advanced Directives  Does Patient Have a Medical Advance Directive? Yes Yes No Yes Yes Yes  Type of Estate agent of [place];Living will Healthcare Power of [place];Living will  Healthcare Power of [place];Living will Living will Healthcare Power of [place];Living will  Does patient want to make changes to medical advance directive? No - Patient declined No - Patient declined  No - Patient declined  No - Patient declined  Copy of Healthcare Power of Attorney in Chart? No - copy requested No - copy requested  No - copy requested  No - copy requested    Current Medications (verified) Outpatient Encounter Medications as of 12/31/2022  Medication Sig   acyclovir (ZOVIRAX) 400 MG tablet TAKE 1 TABLET BY MOUTH  DAILY   aspirin 81 MG EC tablet Take by mouth.   atorvastatin (LIPITOR) 40 MG tablet TAKE 1 TABLET BY MOUTH DAILY   Calcium Carbonate-Vitamin D 600-400 MG-UNIT tablet Take by mouth.   citalopram (CELEXA) 40 MG tablet TAKE 1 TABLET BY MOUTH DAILY   clobetasol ointment (TEMOVATE) 0.05 % Apply pea size amount daily x 4 weeks, then every other day x 4 weeks, then 2-3 times weekly for maintenance   ferrous sulfate 325 (65 FE) MG EC tablet Take 325 mg by mouth daily with breakfast.    gabapentin (NEURONTIN) 600 MG  tablet TAKE 2 TABLETS BY MOUTH IN THE  MORNING AND 3 TABLETS BY MOUTH  IN THE EVENING   glucose blood (ONE TOUCH ULTRA TEST) test strip TEST BLOOD SUGAR TWICE A DAY   HYDROcodone-acetaminophen (NORCO) 10-325 MG tablet TAKE 1 TABLET BY MOUTH THREE TIMES DAILY AS NEEDED FOR CHRONIC PAIN   latanoprost (XALATAN) 0.005 % ophthalmic solution INSTILL 1 DROP INTO THE  LEFT EYE AT BEDTIME  (REFRIGERATE UNTIL FIRST  OPENED FOR USE) (Patient taking differently: Place 1 drop into both eyes.)   Melatonin 5 MG CAPS Take by mouth.   metFORMIN (GLUCOPHAGE) 500 MG tablet TAKE 1 TABLET BY MOUTH  TWICE DAILY WITH A MEAL   nystatin cream (MYCOSTATIN) Apply 1 application topically 2 (two) times daily.   omeprazole (PRILOSEC) 40 MG capsule TAKE 1 CAPSULE BY MOUTH DAILY   No facility-administered encounter medications on file as of 12/31/2022.    Allergies (verified) Dorzolamide and Ivp dye [iodinated contrast media]   History: Past Medical History:  Diagnosis Date   Anxiety and depression    Colon polyps    Depression    Diabetes mellitus    Endometriosis    requiring hysterectomy   History of chicken pox    Hypercholesterolemia    Hypertension    Nephrolithiasis    Pericarditis    recurrent, unkown origin   Tachycardia  Past Surgical History:  Procedure Laterality Date   ABDOMINAL HYSTERECTOMY  1980   APPENDECTOMY  1055   BACK SURGERY  1005-2010   laminectomy   CHOLECYSTECTOMY     open   OOPHORECTOMY  1982   TONSILLECTOMY     Family History  Problem Relation Age of Onset   Heart disease Father        myocardial infarction - died 60   Thyroid disease Mother    Transient ischemic attack Mother        multiple   Breast cancer Mother    Hyperlipidemia Mother    Kidney disease Mother    Diabetes Mother    Rheumatic fever Sister        mitral valve problems   Breast cancer Paternal Aunt    Other Sister        Small vessel disease   Colon cancer Neg Hx    Social History    Socioeconomic History   Marital status: Married    Spouse name: Not on file   Number of children: 3   Years of education: Not on file   Highest education level: Not on file  Occupational History   Not on file  Tobacco Use   Smoking status: Never   Smokeless tobacco: Never  Vaping Use   Vaping Use: Never used  Substance and Sexual Activity   Alcohol use: No    Alcohol/week: 0.0 standard drinks of alcohol   Drug use: No   Sexual activity: Never  Other Topics Concern   Not on file  Social History Narrative   Not on file   Social Determinants of Health   Financial Resource Strain: Low Risk  (12/31/2022)   Overall Financial Resource Strain (CARDIA)    Difficulty of Paying Living Expenses: Not hard at all  Food Insecurity: No Food Insecurity (12/31/2022)   Hunger Vital Sign    Worried About Running Out of Food in the Last Year: Never true    Ran Out of Food in the Last Year: Never true  Transportation Needs: No Transportation Needs (12/31/2022)   PRAPARE - Administrator, Civil Service (Medical): No    Lack of Transportation (Non-Medical): No  Physical Activity: Unknown (07/14/2019)   Exercise Vital Sign    Days of Exercise per Week: 0 days    Minutes of Exercise per Session: Not on file  Stress: No Stress Concern Present (12/31/2022)   [employer] of Occupational Health - Occupational Stress Questionnaire    Feeling of Stress : Not at all  Social Connections: Unknown (12/31/2022)   Social Connection and Isolation Panel [NHANES]    Frequency of Communication with Friends and Family: Not on file    Frequency of Social Gatherings with Friends and Family: Not on file    Attends Religious Services: Not on file    Active Member of Clubs or Organizations: Not on file    Attends Banker Meetings: Not on file    Marital Status: Married    Tobacco Counseling Counseling given: Not Answered   Clinical Intake:  Pre-visit preparation completed: Yes         Diabetes:  (Followed by pcp)  How often do you need to have someone help you when you read instructions, pamphlets, or other written materials from your doctor or pharmacy?: 1 - Never  Nutrition Risk Assessment: Has the patient had any N/V/D within the last 2 months?  No  Does the patient have any non-healing  wounds?  No  Has the patient had any unintentional weight loss or weight gain?  No   Diabetes: Is the patient diabetic?  Yes  If diabetic, was a CBG obtained today?  No  Did the patient bring in their glucometer from home?  No  How often do you monitor your CBG's? Every other day.   Financial Strains and Diabetes Management: Are you having any financial strains with the device, your supplies or your medication? No .  Does the patient want to be seen by Chronic Care Management for management of their diabetes?  No  Would the patient like to be referred to a Nutritionist or for Diabetic Management?  No    Interpreter Needed?: No      Activities of Daily Living    12/31/2022    1:17 PM  In your present state of health, do you have any difficulty performing the following activities:  Hearing? 0  Vision? 0  Difficulty concentrating or making decisions? 0  Walking or climbing stairs? 0  Dressing or bathing? 0  Doing errands, shopping? 0  Preparing Food and eating ? N  Using the Toilet? N  In the past six months, have you accidently leaked urine? N  Do you have problems with loss of bowel control? N  Managing your Medications? N  Managing your Finances? N  Housekeeping or managing your Housekeeping? N    Patient Care Team: [place], MD as PCP - General (Internal Medicine)  Indicate any recent Medical Services you may have received from other than [facility] providers in the past year (date may be approximate).     Assessment:   This is a routine wellness examination for [place].  I connected with  Sherry Simon on 12/31/22 by a audio enabled  telemedicine application and verified that I am speaking with the correct person using two identifiers.  Patient Location: Home  Provider Location: Office/Clinic  I discussed the limitations of evaluation and management by telemedicine. The patient expressed understanding and agreed to proceed.   Hearing/Vision screen Hearing Screening - Comments:: Moderate hearing loss Does not wear hearing aids   Vision Screening - Comments:: Followed by [facility],  Dr.Muir and Dr.Fehrat  Visits q 3 months Partial retina detachment Glaucoma   Dietary issues and exercise activities discussed: Current Exercise Habits: The patient has a physically strenuous job, but has no regular exercise apart from work. Regular diet; monitors 32 ounces of water daily    Goals Addressed               This Visit's Progress     Patient Stated     Increase physical activity (pt-stated)        Stay active; stretch       Depression Screen    12/31/2022    1:13 PM 08/24/2022    1:30 PM 04/24/2022   11:25 AM 12/26/2021    3:50 PM 12/13/2021   11:13 AM 09/12/2021   11:44 AM 09/30/2020    2:49 PM  PHQ 2/9 Scores  PHQ - 2 Score 0 0 0 0 0 0 0    Fall Risk    12/31/2022    1:11 PM 08/24/2022    1:29 PM 04/24/2022   11:25 AM 12/26/2021    3:53 PM 12/13/2021   11:13 AM  Fall Risk   Falls in the past year? 1 1 0 0 0  Number falls in past yr: 1 0  0   Comment Tripped.  Tangled in the dog leash.      Injury with Fall? 0 0     Comment Sought medical care.      Risk for fall due to :  No Fall Risks No Fall Risks  No Fall Risks  Follow up Falls evaluation completed;Falls prevention discussed Falls evaluation completed Falls evaluation completed Falls evaluation completed Falls evaluation completed    FALL RISK PREVENTION PERTAINING TO THE HOME: Home free of loose throw rugs in walkways, pet beds, electrical cords, etc? Yes  Adequate lighting in your home to reduce risk of falls? Yes   ASSISTIVE DEVICES  UTILIZED TO PREVENT FALLS: Life alert? No  Use of a cane, walker or w/c? No  Grab bars in the bathroom? Yes  Shower chair or bench in shower? Yes  Elevated toilet seat or a handicapped toilet? No   TIMED UP AND GO: Was the test performed? No .    Cognitive Function:    10/11/2015   11:52 AM  MMSE - Mini Mental State Exam  Orientation to time 5  Orientation to Place 5  Registration 3  Attention/ Calculation 5  Recall 3  Language- name 2 objects 2  Language- repeat 1  Language- follow 3 step command 3  Language- read & follow direction 1  Write a sentence 1  Copy design 1  Total score 30        12/31/2022    1:19 PM 07/14/2019   10:55 AM  6CIT Screen  What Year? 0 points 0 points  What month? 0 points 0 points  What time? 0 points 0 points  Count back from 20 0 points 0 points  Months in reverse 0 points 0 points  Repeat phrase 0 points 0 points  Total Score 0 points 0 points    Immunizations Immunization History  Administered Date(s) Administered   Fluad Quad(high Dose 65+) 06/13/2021   Influenza, High Dose Seasonal PF 06/08/2016, 07/02/2017, 07/17/2018, 07/28/2019   Influenza,inj,Quad PF,6+ Mos 06/18/2014, 05/23/2015   Influenza-Unspecified 06/08/2016, 07/02/2017, 09/22/2020   PFIZER Comirnaty(Gray Top)Covid-19 Tri-Sucrose Vaccine 11/18/2019, 12/16/2019   PFIZER(Purple Top)SARS-COV-2 Vaccination 11/18/2019, 12/16/2019   Pneumococcal Conjugate-13 06/09/2016   Pneumococcal-Unspecified 06/08/2016   Respiratory Syncytial Virus Vaccine,Recomb Aduvanted(Arexvy) 09/03/2022   Tdap 09/01/2007, 07/25/2020   Screening Tests Health Maintenance  Topic Date Due   COLONOSCOPY (Pts 45-70yrs Insurance coverage will need to be confirmed)  07/15/2022   COVID-19 Vaccine (5 - 2023-24 season) 01/16/2023 (Originally 05/25/2022)   Zoster Vaccines- Shingrix (1 of 2) 02/26/2023 (Originally 03/04/1965)   Pneumonia Vaccine 45+ Years old (2 of 2 - PPSV23 or PCV20) 11/26/2023 (Originally  06/09/2017)   MAMMOGRAM  03/09/2023   INFLUENZA VACCINE  04/25/2023   HEMOGLOBIN A1C  05/29/2023   Diabetic kidney evaluation - Urine ACR  08/25/2023   OPHTHALMOLOGY EXAM  08/31/2023   Diabetic kidney evaluation - eGFR measurement  11/26/2023   FOOT EXAM  11/26/2023   Medicare Annual Wellness (AWV)  12/31/2023   DTaP/Tdap/Td (3 - Td or Tdap) 07/25/2030   DEXA SCAN  Completed   Hepatitis C Screening  Completed   HPV VACCINES  Aged Out    Health Maintenance Health Maintenance Due  Topic Date Due   COLONOSCOPY (Pts 45-51yrs Insurance coverage will need to be confirmed)  07/15/2022    Colonoscopy- deferred per patient preference.   Lung Cancer Screening: (Low Dose CT Chest recommended if Age 72-80 years, 30 pack-year currently smoking OR have quit w/in 15years.) does not qualify.   Hepatitis  C Screening: Completed 09/2015.   Vision Screening: Recommended annual ophthalmology exams for early detection of glaucoma and other disorders of the eye.  Dental Screening: Recommended annual dental exams for proper oral hygiene  Community Resource Referral / Chronic Care Management: CRR required this visit?  No   CCM required this visit?  No      Plan:     I have personally reviewed and noted the following in the patient's chart:   Medical and social history Use of alcohol, tobacco or illicit drugs  Current medications and supplements including opioid prescriptions. Patient is currently taking opioid prescriptions. Information provided to patient regarding non-opioid alternatives. Patient advised to discuss non-opioid treatment plan with their provider. Followed by [facility] Functional ability and status Nutritional status Physical activity Advanced directives List of other physicians Hospitalizations, surgeries, and ER visits in previous 12 months Vitals Screenings to include cognitive, depression, and falls Referrals and appointments  In addition, I have  reviewed and discussed with patient certain preventive protocols, quality metrics, and best practice recommendations. A written personalized care plan for preventive services as well as general preventive health recommendations were provided to patient.     Cathey EndowDenisa L Motley, LPN   1/6/10964/04/2023

## 2022-12-31 NOTE — Patient Instructions (Addendum)
Sherry Simon , Thank you for taking time to come for your Medicare Wellness Visit. I appreciate your ongoing commitment to your health goals. Please review the following plan we discussed and let me know if I can assist you in the future.   These are the goals we discussed:  Goals       Patient Stated     Increase physical activity (pt-stated)      Stay active; stretch        This is a list of the screening recommended for you and due dates:  Health Maintenance  Topic Date Due   Colon Cancer Screening  07/15/2022   COVID-19 Vaccine (5 - 2023-24 season) 01/16/2023*   Zoster (Shingles) Vaccine (1 of 2) 02/26/2023*   Pneumonia Vaccine (2 of 2 - PPSV23 or PCV20) 11/26/2023*   Mammogram  03/09/2023   Flu Shot  04/25/2023   Hemoglobin A1C  05/29/2023   Yearly kidney health urinalysis for diabetes  08/25/2023   Eye exam for diabetics  08/31/2023   Yearly kidney function blood test for diabetes  11/26/2023   Complete foot exam   11/26/2023   Medicare Annual Wellness Visit  12/31/2023   DTaP/Tdap/Td vaccine (3 - Td or Tdap) 07/25/2030   DEXA scan (bone density measurement)  Completed   Hepatitis C Screening: USPSTF Recommendation to screen - Ages 5618-79 yo.  Completed   HPV Vaccine  Aged Out  *Topic was postponed. The date shown is not the original due date.    Advanced directives: End of life planning; Advance aging; Advanced directives discussed.  Copy of current HCPOA/Living Will requested.    Conditions/risks identified: none new  Next appointment: Follow up in one year for your annual wellness visit    Preventive Care 65 Years and Older, Female Preventive care refers to lifestyle choices and visits with your health care provider that can promote health and wellness. What does preventive care include? A yearly physical exam. This is also called an annual well check. Dental exams once or twice a year. Routine eye exams. Ask your health care provider how often you should have  your eyes checked. Personal lifestyle choices, including: Daily care of your teeth and gums. Regular physical activity. Eating a healthy diet. Avoiding tobacco and drug use. Limiting alcohol use. Practicing safe sex. Taking low-dose aspirin every day. Taking vitamin and mineral supplements as recommended by your health care provider. What happens during an annual well check? The services and screenings done by your health care provider during your annual well check will depend on your age, overall health, lifestyle risk factors, and family history of disease. Counseling  Your health care provider may ask you questions about your: Alcohol use. Tobacco use. Drug use. Emotional well-being. Home and relationship well-being. Sexual activity. Eating habits. History of falls. Memory and ability to understand (cognition). Work and work Astronomerenvironment. Reproductive health. Screening  You may have the following tests or measurements: Height, weight, and BMI. Blood pressure. Lipid and cholesterol levels. These may be checked every 5 years, or more frequently if you are over 77 years old. Skin check. Lung cancer screening. You may have this screening every year starting at age 77 if you have a 30-pack-year history of smoking and currently smoke or have quit within the past 15 years. Fecal occult blood test (FOBT) of the stool. You may have this test every year starting at age 10550. Flexible sigmoidoscopy or colonoscopy. You may have a sigmoidoscopy every 5 years or a colonoscopy  every 10 years starting at age 15. Hepatitis C blood test. Hepatitis B blood test. Sexually transmitted disease (STD) testing. Diabetes screening. This is done by checking your blood sugar (glucose) after you have not eaten for a while (fasting). You may have this done every 1-3 years. Bone density scan. This is done to screen for osteoporosis. You may have this done starting at age 92. Mammogram. This may be done every  1-2 years. Talk to your health care provider about how often you should have regular mammograms. Talk with your health care provider about your test results, treatment options, and if necessary, the need for more tests. Vaccines  Your health care provider may recommend certain vaccines, such as: Influenza vaccine. This is recommended every year. Tetanus, diphtheria, and acellular pertussis (Tdap, Td) vaccine. You may need a Td booster every 10 years. Zoster vaccine. You may need this after age 23. Pneumococcal 13-valent conjugate (PCV13) vaccine. One dose is recommended after age 57. Pneumococcal polysaccharide (PPSV23) vaccine. One dose is recommended after age 62. Talk to your health care provider about which screenings and vaccines you need and how often you need them. This information is not intended to replace advice given to you by your health care provider. Make sure you discuss any questions you have with your health care provider. Document Released: 10/07/2015 Document Revised: 05/30/2016 Document Reviewed: 07/12/2015 Elsevier Interactive Patient Education  2017 [employer].  Fall Prevention in the Home Falls can cause injuries. They can happen to people of all ages. There are many things you can do to make your home safe and to help prevent falls. What can I do on the outside of my home? Regularly fix the edges of walkways and driveways and fix any cracks. Remove anything that might make you trip as you walk through a door, such as a raised step or threshold. Trim any bushes or trees on the path to your home. Use bright outdoor lighting. Clear any walking paths of anything that might make someone trip, such as rocks or tools. Regularly check to see if handrails are loose or broken. Make sure that both sides of any steps have handrails. Any raised decks and porches should have guardrails on the edges. Have any leaves, snow, or ice cleared regularly. Use sand or salt on walking  paths during winter. Clean up any spills in your garage right away. This includes oil or grease spills. What can I do in the bathroom? Use night lights. Install grab bars by the toilet and in the tub and shower. Do not use towel bars as grab bars. Use non-skid mats or decals in the tub or shower. If you need to sit down in the shower, use a plastic, non-slip stool. Keep the floor dry. Clean up any water that spills on the floor as soon as it happens. Remove soap buildup in the tub or shower regularly. Attach bath mats securely with double-sided non-slip rug tape. Do not have throw rugs and other things on the floor that can make you trip. What can I do in the bedroom? Use night lights. Make sure that you have a light by your bed that is easy to reach. Do not use any sheets or blankets that are too big for your bed. They should not hang down onto the floor. Have a firm chair that has side arms. You can use this for support while you get dressed. Do not have throw rugs and other things on the floor that can make  you trip. What can I do in the kitchen? Clean up any spills right away. Avoid walking on wet floors. Keep items that you use a lot in easy-to-reach places. If you need to reach something above you, use a strong step stool that has a grab bar. Keep electrical cords out of the way. Do not use floor polish or wax that makes floors slippery. If you must use wax, use non-skid floor wax. Do not have throw rugs and other things on the floor that can make you trip. What can I do with my stairs? Do not leave any items on the stairs. Make sure that there are handrails on both sides of the stairs and use them. Fix handrails that are broken or loose. Make sure that handrails are as long as the stairways. Check any carpeting to make sure that it is firmly attached to the stairs. Fix any carpet that is loose or worn. Avoid having throw rugs at the top or bottom of the stairs. If you do have  throw rugs, attach them to the floor with carpet tape. Make sure that you have a light switch at the top of the stairs and the bottom of the stairs. If you do not have them, ask someone to add them for you. What else can I do to help prevent falls? Wear shoes that: Do not have high heels. Have rubber bottoms. Are comfortable and fit you well. Are closed at the toe. Do not wear sandals. If you use a stepladder: Make sure that it is fully opened. Do not climb a closed stepladder. Make sure that both sides of the stepladder are locked into place. Ask someone to hold it for you, if possible. Clearly mark and make sure that you can see: Any grab bars or handrails. First and last steps. Where the edge of each step is. Use tools that help you move around (mobility aids) if they are needed. These include: Canes. Walkers. Scooters. Crutches. Turn on the lights when you go into a dark area. Replace any light bulbs as soon as they burn out. Set up your furniture so you have a clear path. Avoid moving your furniture around. If any of your floors are uneven, fix them. If there are any pets around you, be aware of where they are. Review your medicines with your doctor. Some medicines can make you feel dizzy. This can increase your chance of falling. Ask your doctor what other things that you can do to help prevent falls. This information is not intended to replace advice given to you by your health care provider. Make sure you discuss any questions you have with your health care provider. Document Released: 07/07/2009 Document Revised: 02/16/2016 Document Reviewed: 10/15/2014 Elsevier Interactive Patient Education  2017 [employer].

## 2023-01-11 ENCOUNTER — Other Ambulatory Visit: Payer: Self-pay | Admitting: Family

## 2023-01-23 DIAGNOSIS — M5416 Radiculopathy, lumbar region: Secondary | ICD-10-CM | POA: Diagnosis not present

## 2023-01-23 DIAGNOSIS — G894 Chronic pain syndrome: Secondary | ICD-10-CM | POA: Diagnosis not present

## 2023-01-23 DIAGNOSIS — M4722 Other spondylosis with radiculopathy, cervical region: Secondary | ICD-10-CM | POA: Diagnosis not present

## 2023-01-23 DIAGNOSIS — M5136 Other intervertebral disc degeneration, lumbar region: Secondary | ICD-10-CM | POA: Diagnosis not present

## 2023-01-23 DIAGNOSIS — M25551 Pain in right hip: Secondary | ICD-10-CM | POA: Diagnosis not present

## 2023-02-06 DIAGNOSIS — H43813 Vitreous degeneration, bilateral: Secondary | ICD-10-CM | POA: Diagnosis not present

## 2023-02-06 DIAGNOSIS — H33012 Retinal detachment with single break, left eye: Secondary | ICD-10-CM | POA: Diagnosis not present

## 2023-02-06 DIAGNOSIS — H4422 Degenerative myopia, left eye: Secondary | ICD-10-CM | POA: Diagnosis not present

## 2023-02-06 DIAGNOSIS — H3322 Serous retinal detachment, left eye: Secondary | ICD-10-CM | POA: Diagnosis not present

## 2023-02-06 DIAGNOSIS — H35052 Retinal neovascularization, unspecified, left eye: Secondary | ICD-10-CM | POA: Diagnosis not present

## 2023-02-06 DIAGNOSIS — E119 Type 2 diabetes mellitus without complications: Secondary | ICD-10-CM | POA: Diagnosis not present

## 2023-02-06 DIAGNOSIS — H35372 Puckering of macula, left eye: Secondary | ICD-10-CM | POA: Diagnosis not present

## 2023-02-07 DIAGNOSIS — Z7722 Contact with and (suspected) exposure to environmental tobacco smoke (acute) (chronic): Secondary | ICD-10-CM | POA: Diagnosis not present

## 2023-02-07 DIAGNOSIS — I2510 Atherosclerotic heart disease of native coronary artery without angina pectoris: Secondary | ICD-10-CM | POA: Diagnosis not present

## 2023-02-07 DIAGNOSIS — E119 Type 2 diabetes mellitus without complications: Secondary | ICD-10-CM | POA: Diagnosis not present

## 2023-02-07 DIAGNOSIS — H5212 Myopia, left eye: Secondary | ICD-10-CM | POA: Diagnosis not present

## 2023-02-07 DIAGNOSIS — I10 Essential (primary) hypertension: Secondary | ICD-10-CM | POA: Diagnosis not present

## 2023-02-07 DIAGNOSIS — H33002 Unspecified retinal detachment with retinal break, left eye: Secondary | ICD-10-CM | POA: Diagnosis not present

## 2023-02-07 DIAGNOSIS — H4422 Degenerative myopia, left eye: Secondary | ICD-10-CM | POA: Diagnosis not present

## 2023-02-07 DIAGNOSIS — Z91041 Radiographic dye allergy status: Secondary | ICD-10-CM | POA: Diagnosis not present

## 2023-02-07 DIAGNOSIS — H33022 Retinal detachment with multiple breaks, left eye: Secondary | ICD-10-CM | POA: Diagnosis not present

## 2023-02-07 DIAGNOSIS — Z888 Allergy status to other drugs, medicaments and biological substances status: Secondary | ICD-10-CM | POA: Diagnosis not present

## 2023-02-07 DIAGNOSIS — H3322 Serous retinal detachment, left eye: Secondary | ICD-10-CM | POA: Diagnosis not present

## 2023-02-07 DIAGNOSIS — Z7982 Long term (current) use of aspirin: Secondary | ICD-10-CM | POA: Diagnosis not present

## 2023-02-26 ENCOUNTER — Ambulatory Visit (INDEPENDENT_AMBULATORY_CARE_PROVIDER_SITE_OTHER): Payer: Medicare Other | Admitting: Internal Medicine

## 2023-02-26 VITALS — BP 126/74 | HR 74 | Temp 98.0°F | Resp 16 | Ht 64.0 in | Wt 154.2 lb

## 2023-02-26 DIAGNOSIS — E7800 Pure hypercholesterolemia, unspecified: Secondary | ICD-10-CM

## 2023-02-26 DIAGNOSIS — Z853 Personal history of malignant neoplasm of breast: Secondary | ICD-10-CM | POA: Diagnosis not present

## 2023-02-26 DIAGNOSIS — F439 Reaction to severe stress, unspecified: Secondary | ICD-10-CM

## 2023-02-26 DIAGNOSIS — D649 Anemia, unspecified: Secondary | ICD-10-CM

## 2023-02-26 DIAGNOSIS — E041 Nontoxic single thyroid nodule: Secondary | ICD-10-CM

## 2023-02-26 DIAGNOSIS — K219 Gastro-esophageal reflux disease without esophagitis: Secondary | ICD-10-CM

## 2023-02-26 DIAGNOSIS — M549 Dorsalgia, unspecified: Secondary | ICD-10-CM | POA: Diagnosis not present

## 2023-02-26 DIAGNOSIS — I10 Essential (primary) hypertension: Secondary | ICD-10-CM

## 2023-02-26 DIAGNOSIS — G8929 Other chronic pain: Secondary | ICD-10-CM | POA: Diagnosis not present

## 2023-02-26 DIAGNOSIS — E1165 Type 2 diabetes mellitus with hyperglycemia: Secondary | ICD-10-CM

## 2023-02-26 DIAGNOSIS — R911 Solitary pulmonary nodule: Secondary | ICD-10-CM

## 2023-02-26 MED ORDER — ATORVASTATIN CALCIUM 40 MG PO TABS: 40.0000 mg | ORAL_TABLET | Freq: Every day | ORAL | 2 refills | Status: DC

## 2023-02-26 NOTE — Progress Notes (Signed)
Subjective:    Patient ID: Sherry Simon, female    DOB: 1946-05-28, 77 y.o.   MRN: [id number]  Patient here for  Chief Complaint  Patient presents with   Medical Management of Chronic Issues    HPI Here for follow up appt - f/u regarding her blood sugars, blood pressure and cholesterol.  Has chronic back/leg pain. Followed by [facility]. Being followed by ophthalmology - retinal detachment. S/p procedure.  Vision has not returned in the left eye.  Continues to f/u with ophthalmology. Has not been as active due to her eye.  Not lifting, pulling as much.  Back and legs have been better.  Breathing stable.  No increased cough.  No abdominal pain.  No nausea now.  Eating.  Sugars - 100 in am and 140 two hours after eating.  Off metformin.     Past Medical History:  Diagnosis Date   Anxiety and depression    Colon polyps    Depression    Diabetes mellitus (HCC)    Endometriosis    requiring hysterectomy   History of chicken pox    Hypercholesterolemia    Hypertension    Nephrolithiasis    Pericarditis    recurrent, unkown origin   Tachycardia    Past Surgical History:  Procedure Laterality Date   ABDOMINAL HYSTERECTOMY  1980   APPENDECTOMY  1055   BACK SURGERY  1005-2010   laminectomy   CHOLECYSTECTOMY     open   OOPHORECTOMY  1982   TONSILLECTOMY     Family History  Problem Relation Age of Onset   Heart disease Father        myocardial infarction - died 72   Thyroid disease Mother    Transient ischemic attack Mother        multiple   Breast cancer Mother    Hyperlipidemia Mother    Kidney disease Mother    Diabetes Mother    Rheumatic fever Sister        mitral valve problems   Breast cancer Paternal Aunt    Other Sister        Small vessel disease   Colon cancer Neg Hx    Social History   Socioeconomic History   Marital status: Married    Spouse name: Not on file   Number of children: 3   Years of education: Not on file   Highest education level: Not on  file  Occupational History   Not on file  Tobacco Use   Smoking status: Never   Smokeless tobacco: Never  Vaping Use   Vaping Use: Never used  Substance and Sexual Activity   Alcohol use: No    Alcohol/week: 0.0 standard drinks of alcohol   Drug use: No   Sexual activity: Never  Other Topics Concern   Not on file  Social History Narrative   Not on file   Social Determinants of Health   Financial Resource Strain: Low Risk  (12/31/2022)   Overall Financial Resource Strain (CARDIA)    Difficulty of Paying Living Expenses: Not hard at all  Food Insecurity: No Food Insecurity (12/31/2022)   Hunger Vital Sign    Worried About Running Out of Food in the Last Year: Never true    Ran Out of Food in the Last Year: Never true  Transportation Needs: No Transportation Needs (12/31/2022)   PRAPARE - Administrator, Civil Service (Medical): No    Lack of Transportation (Non-Medical): No  Physical  Activity: Unknown (07/14/2019)   Exercise Vital Sign    Days of Exercise per Week: 0 days    Minutes of Exercise per Session: Not on file  Stress: No Stress Concern Present (12/31/2022)   [employer] of Occupational Health - Occupational Stress Questionnaire    Feeling of Stress : Not at all  Social Connections: Unknown (12/31/2022)   Social Connection and Isolation Panel [NHANES]    Frequency of Communication with Friends and Family: Not on file    Frequency of Social Gatherings with Friends and Family: Not on file    Attends Religious Services: Not on file    Active Member of Clubs or Organizations: Not on file    Attends Banker Meetings: Not on file    Marital Status: Married     Review of Systems  Constitutional:  Negative for appetite change and unexpected weight change.  HENT:  Negative for congestion and sinus pressure.   Respiratory:  Negative for cough, chest tightness and shortness of breath.   Cardiovascular:  Negative for chest pain, palpitations and leg  swelling.  Gastrointestinal:  Negative for abdominal pain, diarrhea, nausea and vomiting.  Genitourinary:  Negative for difficulty urinating and dysuria.  Musculoskeletal:  Negative for joint swelling and myalgias.  Skin:  Negative for color change and rash.  Neurological:  Negative for dizziness and headaches.  Psychiatric/Behavioral:  Negative for agitation and dysphoric mood.        Objective:     BP 126/74   Pulse 74   Temp 98 F (36.7 C)   Resp 16   Ht 5\' 4"  (1.626 m)   Wt 154 lb 3.2 oz (69.9 kg)   SpO2 98%   BMI 26.47 kg/m  Wt Readings from Last 3 Encounters:  02/26/23 154 lb 3.2 oz (69.9 kg)  12/31/22 154 lb (69.9 kg)  11/26/22 154 lb (69.9 kg)    Physical Exam Vitals reviewed.  Constitutional:      General: She is not in acute distress.    Appearance: Normal appearance.  HENT:     Head: Normocephalic and atraumatic.     Right Ear: External ear normal.     Left Ear: External ear normal.  Eyes:     General: No scleral icterus.       Right eye: No discharge.        Left eye: No discharge.     Conjunctiva/sclera: Conjunctivae normal.  Neck:     Thyroid: No thyromegaly.  Cardiovascular:     Rate and Rhythm: Normal rate and regular rhythm.  Pulmonary:     Effort: No respiratory distress.     Breath sounds: Normal breath sounds. No wheezing.  Abdominal:     General: Bowel sounds are normal.     Palpations: Abdomen is soft.     Tenderness: There is no abdominal tenderness.  Musculoskeletal:        General: No swelling or tenderness.     Cervical back: Neck supple. No tenderness.  Lymphadenopathy:     Cervical: No cervical adenopathy.  Skin:    Findings: No erythema or rash.  Neurological:     Mental Status: She is alert.  Psychiatric:        Mood and Affect: Mood normal.        Behavior: Behavior normal.      Outpatient Encounter Medications as of 02/26/2023  Medication Sig   acyclovir (ZOVIRAX) 400 MG tablet TAKE 1 TABLET BY MOUTH  DAILY  aspirin 81 MG EC tablet Take by mouth.   atorvastatin (LIPITOR) 40 MG tablet Take 1 tablet (40 mg total) by mouth daily.   Calcium Carbonate-Vitamin D 600-400 MG-UNIT tablet Take by mouth.   citalopram (CELEXA) 40 MG tablet TAKE 1 TABLET BY MOUTH DAILY   clobetasol ointment (TEMOVATE) 0.05 % Apply pea size amount daily x 4 weeks, then every other day x 4 weeks, then 2-3 times weekly for maintenance   ferrous sulfate 325 (65 FE) MG EC tablet Take 325 mg by mouth daily with breakfast.    gabapentin (NEURONTIN) 600 MG tablet TAKE 2 TABLETS BY MOUTH IN THE  MORNING AND 3 TABLETS BY MOUTH  IN THE EVENING   glucose blood (ONE TOUCH ULTRA TEST) test strip TEST BLOOD SUGAR TWICE A DAY   HYDROcodone-acetaminophen (NORCO) 10-325 MG tablet TAKE 1 TABLET BY MOUTH THREE TIMES DAILY AS NEEDED FOR CHRONIC PAIN   latanoprost (XALATAN) 0.005 % ophthalmic solution INSTILL 1 DROP INTO THE  LEFT EYE AT BEDTIME  (REFRIGERATE UNTIL FIRST  OPENED FOR USE) (Patient taking differently: Place 1 drop into both eyes.)   Melatonin 5 MG CAPS Take by mouth.   nystatin cream (MYCOSTATIN) Apply 1 application topically 2 (two) times daily.   omeprazole (PRILOSEC) 40 MG capsule TAKE 1 CAPSULE BY MOUTH DAILY   [DISCONTINUED] atorvastatin (LIPITOR) 40 MG tablet TAKE 1 TABLET BY MOUTH DAILY   [DISCONTINUED] metFORMIN (GLUCOPHAGE) 500 MG tablet TAKE 1 TABLET BY MOUTH  TWICE DAILY WITH A MEAL   No facility-administered encounter medications on file as of 02/26/2023.     Lab Results  Component Value Date   WBC 4.6 11/26/2022   HGB 12.8 11/26/2022   HCT 38.1 11/26/2022   PLT 318.0 11/26/2022   GLUCOSE 78 11/26/2022   CHOL 153 11/26/2022   TRIG 151.0 (H) 11/26/2022   HDL 53.40 11/26/2022   LDLCALC 69 11/26/2022   ALT 11 11/26/2022   AST 12 11/26/2022   NA 140 11/26/2022   K 4.4 11/26/2022   CL 104 11/26/2022   CREATININE 0.60 11/26/2022   BUN 13 11/26/2022   CO2 27 11/26/2022   TSH 0.85 08/24/2022   HGBA1C 6.4 11/26/2022    MICROALBUR <0.7 08/24/2022    MR Brain W Wo Contrast  Result Date: 09/04/2022 CLINICAL DATA:  Provided history: Change in vision. Transient ischemic attack. Additional history provided by scanning technologist: Suspected TIA in September 2023 (sharp headache with loss of vision in left eye for several seconds followed by dizziness). EXAM: MRI HEAD WITHOUT AND WITH CONTRAST TECHNIQUE: Multiplanar, multiecho pulse sequences of the brain and surrounding structures were obtained without and with intravenous contrast. CONTRAST:  7mL GADAVIST GADOBUTROL 1 MMOL/ML IV SOLN COMPARISON:  Head CT 01/24/2009. FINDINGS: Brain: No age advanced or lobar predominant parenchymal atrophy. Multifocal T2 FLAIR hyperintense signal abnormality within the cerebral white matter, nonspecific but compatible with mild chronic small vessel ischemic disease. There is no acute infarct. No evidence of an intracranial mass. No extra-axial fluid collection. No midline shift. No pathologic intracranial enhancement identified. Vascular: Maintained flow voids within the proximal large arterial vessels. Skull and upper cervical spine: No focal suspicious marrow lesion. Sinuses/Orbits: No mass or acute finding within the imaged orbits. Prior bilateral ocular lens replacement. Trace mucosal thickening within the bilateral ethmoid air cells. IMPRESSION: No evidence of acute intracranial abnormality. Mild chronic small vessel ischemic changes within the cerebral white matter. Electronically Signed   By: Jackey Loge D.O.   On: 09/04/2022 18:16  Assessment & Plan:  Primary hypertension Assessment & Plan: Not on any medication currently.  Blood pressure as outlined.  Follow pressures.  Follow metabolic panel.    Orders: -     Basic metabolic panel; Future  Hypercholesterolemia Assessment & Plan: Continue lipitor.  Low cholesterol diet and exercise.  Follow lipid panel and liver function tests.    Orders: -     Hepatic function  panel; Future -     Lipid panel; Future  Anemia, unspecified type Assessment & Plan: Follow [facility].  Colonoscopy 2018.  Recommended f/u in 5 years. Overdue.  Have discussed.    Chronic midline back pain, unspecified back location Assessment & Plan: Has chronic back/leg pain. Followed by [facility]. Has not been as active due to her eye.  Not lifting, pulling as much.  Back and legs have been better.    Type 2 diabetes mellitus with hyperglycemia, without long-term current use of insulin (HCC) Assessment & Plan: Low carb diet and exercise given elevated sugars.   Not taking metformin.  Sugars doing well. Follow met b and a1c.    Orders: -     Hemoglobin A1c; Future  Gastroesophageal reflux disease, unspecified whether esophagitis present Assessment & Plan: On prilosec.  No upper symptoms reported - while taking regularly.    History of breast cancer Assessment & Plan: Followed by oncology.  Mammogram 03/08/22 - Birads II.    Lung nodule Assessment & Plan: Has been followed by oncology.  F/u chest CT stable.  Recommended no further scanning.     Stress Assessment & Plan: Increased stress.  Discussed.  Continues on citalopram. Notify if feels needs any further intervention. Follow.    Thyroid nodule Assessment & Plan: S/p previous biopsy.  Follow tsh.    Other orders -     Atorvastatin Calcium; Take 1 tablet (40 mg total) by mouth daily.  Dispense: 100 tablet; Refill: 2     Dale Belmont, MD

## 2023-02-27 DIAGNOSIS — G894 Chronic pain syndrome: Secondary | ICD-10-CM | POA: Diagnosis not present

## 2023-03-03 ENCOUNTER — Encounter: Payer: Self-pay | Admitting: Internal Medicine

## 2023-03-03 NOTE — Assessment & Plan Note (Signed)
Followed by oncology.  Mammogram 03/08/22 - Birads II.  

## 2023-03-03 NOTE — Assessment & Plan Note (Signed)
Has been followed by oncology.  F/u chest CT stable.  Recommended no further scanning.   

## 2023-03-03 NOTE — Assessment & Plan Note (Signed)
Continue lipitor.  Low cholesterol diet and exercise.  Follow lipid panel and liver function tests.   

## 2023-03-03 NOTE — Assessment & Plan Note (Signed)
On prilosec.  No upper symptoms reported - while taking regularly.

## 2023-03-03 NOTE — Assessment & Plan Note (Addendum)
Low carb diet and exercise given elevated sugars.   Not taking metformin.  Sugars doing well. Follow met b and a1c.

## 2023-03-03 NOTE — Assessment & Plan Note (Signed)
Has chronic back/leg pain. Followed by [facility]. Has not been as active due to her eye.  Not lifting, pulling as much.  Back and legs have been better.

## 2023-03-03 NOTE — Assessment & Plan Note (Signed)
Follow [facility].  Colonoscopy 2018.  Recommended f/u in 5 years. Overdue.  Have discussed.  

## 2023-03-03 NOTE — Assessment & Plan Note (Signed)
Not on any medication currently.  Blood pressure as outlined.  Follow pressures.  Follow metabolic panel.   

## 2023-03-03 NOTE — Assessment & Plan Note (Signed)
Increased stress.  Discussed.  Continues on citalopram. Notify if feels needs any further intervention. Follow.  

## 2023-03-03 NOTE — Assessment & Plan Note (Signed)
S/p previous biopsy.  Follow tsh.  

## 2023-03-05 DIAGNOSIS — H3322 Serous retinal detachment, left eye: Secondary | ICD-10-CM | POA: Diagnosis not present

## 2023-03-05 DIAGNOSIS — H33012 Retinal detachment with single break, left eye: Secondary | ICD-10-CM | POA: Diagnosis not present

## 2023-03-05 DIAGNOSIS — H35372 Puckering of macula, left eye: Secondary | ICD-10-CM | POA: Diagnosis not present

## 2023-03-22 DIAGNOSIS — Z08 Encounter for follow-up examination after completed treatment for malignant neoplasm: Secondary | ICD-10-CM | POA: Diagnosis not present

## 2023-03-22 DIAGNOSIS — Z853 Personal history of malignant neoplasm of breast: Secondary | ICD-10-CM | POA: Diagnosis not present

## 2023-03-22 DIAGNOSIS — C50911 Malignant neoplasm of unspecified site of right female breast: Secondary | ICD-10-CM | POA: Diagnosis not present

## 2023-03-22 DIAGNOSIS — Z1231 Encounter for screening mammogram for malignant neoplasm of breast: Secondary | ICD-10-CM | POA: Diagnosis not present

## 2023-03-22 DIAGNOSIS — Z923 Personal history of irradiation: Secondary | ICD-10-CM | POA: Diagnosis not present

## 2023-03-22 DIAGNOSIS — Z170 Estrogen receptor positive status [ER+]: Secondary | ICD-10-CM | POA: Diagnosis not present

## 2023-03-22 LAB — HM MAMMOGRAPHY

## 2023-04-10 DIAGNOSIS — M5136 Other intervertebral disc degeneration, lumbar region: Secondary | ICD-10-CM | POA: Diagnosis not present

## 2023-04-10 DIAGNOSIS — M4722 Other spondylosis with radiculopathy, cervical region: Secondary | ICD-10-CM | POA: Diagnosis not present

## 2023-04-10 DIAGNOSIS — M5416 Radiculopathy, lumbar region: Secondary | ICD-10-CM | POA: Diagnosis not present

## 2023-04-10 DIAGNOSIS — G894 Chronic pain syndrome: Secondary | ICD-10-CM | POA: Diagnosis not present

## 2023-04-16 DIAGNOSIS — H35372 Puckering of macula, left eye: Secondary | ICD-10-CM | POA: Diagnosis not present

## 2023-04-23 ENCOUNTER — Other Ambulatory Visit (INDEPENDENT_AMBULATORY_CARE_PROVIDER_SITE_OTHER): Payer: Medicare Other

## 2023-04-23 DIAGNOSIS — I10 Essential (primary) hypertension: Secondary | ICD-10-CM

## 2023-04-23 DIAGNOSIS — E1165 Type 2 diabetes mellitus with hyperglycemia: Secondary | ICD-10-CM | POA: Diagnosis not present

## 2023-04-23 DIAGNOSIS — E7800 Pure hypercholesterolemia, unspecified: Secondary | ICD-10-CM | POA: Diagnosis not present

## 2023-04-23 LAB — HEPATIC FUNCTION PANEL
ALT: 14 U/L (ref 0–35)
AST: 15 U/L (ref 0–37)
Albumin: 4.2 g/dL (ref 3.5–5.2)
Alkaline Phosphatase: 79 U/L (ref 39–117)
Bilirubin, Direct: 0.2 mg/dL (ref 0.0–0.3)
Total Bilirubin: 1.3 mg/dL — ABNORMAL HIGH (ref 0.2–1.2)
Total Protein: 6.5 g/dL (ref 6.0–8.3)

## 2023-04-23 LAB — BASIC METABOLIC PANEL
BUN: 11 mg/dL (ref 6–23)
CO2: 28 mEq/L (ref 19–32)
Calcium: 9.3 mg/dL (ref 8.4–10.5)
Chloride: 104 mEq/L (ref 96–112)
Creatinine, Ser: 0.67 mg/dL (ref 0.40–1.20)
GFR: 84.48 mL/min (ref >60.00)
Glucose, Bld: 109 mg/dL — ABNORMAL HIGH (ref 70–99)
Potassium: 4.4 mEq/L (ref 3.5–5.1)
Sodium: 139 mEq/L (ref 135–145)

## 2023-04-23 LAB — LIPID PANEL
Cholesterol: 147 mg/dL (ref 0–200)
HDL: 52.7 mg/dL (ref >39.00)
LDL Cholesterol: 78 mg/dL (ref 0–99)
NonHDL: 94.56
Total CHOL/HDL Ratio: 3
Triglycerides: 83 mg/dL (ref 0.0–149.0)
VLDL: 16.6 mg/dL (ref 0.0–40.0)

## 2023-04-23 LAB — HEMOGLOBIN A1C: Hgb A1c MFr Bld: 6.4 % (ref 4.6–6.5)

## 2023-04-24 ENCOUNTER — Other Ambulatory Visit: Payer: Self-pay | Admitting: Internal Medicine

## 2023-04-24 DIAGNOSIS — I10 Essential (primary) hypertension: Secondary | ICD-10-CM

## 2023-05-01 DIAGNOSIS — G894 Chronic pain syndrome: Secondary | ICD-10-CM | POA: Diagnosis not present

## 2023-05-09 ENCOUNTER — Encounter (INDEPENDENT_AMBULATORY_CARE_PROVIDER_SITE_OTHER): Payer: Self-pay

## 2023-05-09 ENCOUNTER — Telehealth: Payer: Self-pay | Admitting: Internal Medicine

## 2023-05-09 NOTE — Telephone Encounter (Signed)
Prescription Request  05/09/2023  LOV: 02/26/2023  What is the name of the medication or equipment? acyclovir (ZOVIRAX) 400 MG tablet  Have you contacted your pharmacy to request a refill? Yes   Which pharmacy would you like this sent to?   [facility] [place]     Patient notified that their request is being sent to the clinical staff for review and that they should receive a response within 2 business days.   Please advise at [facility] [phone]

## 2023-05-10 ENCOUNTER — Other Ambulatory Visit: Payer: Self-pay

## 2023-05-10 MED ORDER — ACYCLOVIR 400 MG PO TABS: ORAL_TABLET | ORAL | 0 refills | Status: DC

## 2023-05-10 NOTE — Telephone Encounter (Signed)
Medication refilled

## 2023-05-10 NOTE — Telephone Encounter (Signed)
Patient just called about her refill. I did let her know it can take up to 2 business days. She states she is out of her medication.

## 2023-05-16 ENCOUNTER — Other Ambulatory Visit: Payer: Self-pay | Admitting: Internal Medicine

## 2023-05-16 NOTE — Telephone Encounter (Signed)
Refilled: 08/15/2022 Last OV: 02/26/2023 Next OV: 05/30/2023

## 2023-05-29 DIAGNOSIS — M4722 Other spondylosis with radiculopathy, cervical region: Secondary | ICD-10-CM | POA: Diagnosis not present

## 2023-05-29 DIAGNOSIS — M4807 Spinal stenosis, lumbosacral region: Secondary | ICD-10-CM | POA: Diagnosis not present

## 2023-05-29 DIAGNOSIS — G894 Chronic pain syndrome: Secondary | ICD-10-CM | POA: Diagnosis not present

## 2023-05-29 DIAGNOSIS — M5416 Radiculopathy, lumbar region: Secondary | ICD-10-CM | POA: Diagnosis not present

## 2023-05-30 ENCOUNTER — Encounter: Payer: Self-pay | Admitting: Internal Medicine

## 2023-05-30 ENCOUNTER — Ambulatory Visit (INDEPENDENT_AMBULATORY_CARE_PROVIDER_SITE_OTHER): Payer: Medicare Other | Admitting: Internal Medicine

## 2023-05-30 VITALS — BP 132/70 | HR 71 | Temp 97.8°F | Ht 64.0 in | Wt 162.0 lb

## 2023-05-30 DIAGNOSIS — E538 Deficiency of other specified B group vitamins: Secondary | ICD-10-CM

## 2023-05-30 DIAGNOSIS — M549 Dorsalgia, unspecified: Secondary | ICD-10-CM

## 2023-05-30 DIAGNOSIS — Z853 Personal history of malignant neoplasm of breast: Secondary | ICD-10-CM

## 2023-05-30 DIAGNOSIS — D649 Anemia, unspecified: Secondary | ICD-10-CM

## 2023-05-30 DIAGNOSIS — L900 Lichen sclerosus et atrophicus: Secondary | ICD-10-CM

## 2023-05-30 DIAGNOSIS — E1165 Type 2 diabetes mellitus with hyperglycemia: Secondary | ICD-10-CM | POA: Diagnosis not present

## 2023-05-30 DIAGNOSIS — F439 Reaction to severe stress, unspecified: Secondary | ICD-10-CM

## 2023-05-30 DIAGNOSIS — E7800 Pure hypercholesterolemia, unspecified: Secondary | ICD-10-CM | POA: Diagnosis not present

## 2023-05-30 DIAGNOSIS — W19XXXS Unspecified fall, sequela: Secondary | ICD-10-CM

## 2023-05-30 DIAGNOSIS — Z0000 Encounter for general adult medical examination without abnormal findings: Secondary | ICD-10-CM

## 2023-05-30 DIAGNOSIS — G8929 Other chronic pain: Secondary | ICD-10-CM | POA: Diagnosis not present

## 2023-05-30 DIAGNOSIS — Z1211 Encounter for screening for malignant neoplasm of colon: Secondary | ICD-10-CM

## 2023-05-30 DIAGNOSIS — K219 Gastro-esophageal reflux disease without esophagitis: Secondary | ICD-10-CM

## 2023-05-30 DIAGNOSIS — I10 Essential (primary) hypertension: Secondary | ICD-10-CM

## 2023-05-30 DIAGNOSIS — H539 Unspecified visual disturbance: Secondary | ICD-10-CM

## 2023-05-30 NOTE — Progress Notes (Signed)
Subjective:    Patient ID: Sherry Simon, female    DOB: August 15, 1946, 77 y.o.   MRN: [id number]  Patient here for  Chief Complaint  Patient presents with   Annual Exam    HPI Here for a physical exam.  Evaluated recently by [facility] - chronic back pain. Had f/u yesterday - [facility].  Injection did not help.  Continues on pain medication. Follow up right breast cancer - 03/22/23 - oncology.  S/p 2 falls in December/January - hurt her right ankle and ribs. Had surgical repair - detached retina. Has noticed some issues with her eyes - dizziness.  Unclear etiology.  Depth perception is affected.  Has f/u next week. No chest pain reported.  Breathing stable.  No abdominal pain or bowel change reported.  Increased stress.  Increased - with her husband's health issues.  Discussed.    Past Medical History:  Diagnosis Date   Anxiety and depression    Colon polyps    Depression    Diabetes mellitus (HCC)    Endometriosis    requiring hysterectomy   History of chicken pox    Hypercholesterolemia    Hypertension    Nephrolithiasis    Pericarditis    recurrent, unkown origin   Tachycardia    Past Surgical History:  Procedure Laterality Date   ABDOMINAL HYSTERECTOMY  1980   APPENDECTOMY  1055   BACK SURGERY  1005-2010   laminectomy   CHOLECYSTECTOMY     open   OOPHORECTOMY  1982   TONSILLECTOMY     Family History  Problem Relation Age of Onset   Heart disease Father        myocardial infarction - died 42   Thyroid disease Mother    Transient ischemic attack Mother        multiple   Breast cancer Mother    Hyperlipidemia Mother    Kidney disease Mother    Diabetes Mother    Rheumatic fever Sister        mitral valve problems   Breast cancer Paternal Aunt    Other Sister        Small vessel disease   Colon cancer Neg Hx    Social History   Socioeconomic History   Marital status: Married    Spouse name: Not on file   Number of children: 3   Years of education: Not on file    Highest education level: Not on file  Occupational History   Not on file  Tobacco Use   Smoking status: Never   Smokeless tobacco: Never  Vaping Use   Vaping status: Never Used  Substance and Sexual Activity   Alcohol use: No    Alcohol/week: 0.0 standard drinks of alcohol   Drug use: No   Sexual activity: Never  Other Topics Concern   Not on file  Social History Narrative   Not on file   Social Determinants of Health   Financial Resource Strain: Low Risk  (12/31/2022)   Overall Financial Resource Strain (CARDIA)    Difficulty of Paying Living Expenses: Not hard at all  Food Insecurity: No Food Insecurity (12/31/2022)   Hunger Vital Sign    Worried About Running Out of Food in the Last Year: Never true    Ran Out of Food in the Last Year: Never true  Transportation Needs: No Transportation Needs (12/31/2022)   PRAPARE - Administrator, Civil Service (Medical): No    Lack of Transportation (Non-Medical): No  Physical Activity: Unknown (07/14/2019)   Exercise Vital Sign    Days of Exercise per Week: 0 days    Minutes of Exercise per Session: Not on file  Stress: No Stress Concern Present (12/31/2022)   [employer] of Occupational Health - Occupational Stress Questionnaire    Feeling of Stress : Not at all  Social Connections: Unknown (12/31/2022)   Social Connection and Isolation Panel [NHANES]    Frequency of Communication with Friends and Family: Not on file    Frequency of Social Gatherings with Friends and Family: Not on file    Attends Religious Services: Not on file    Active Member of Clubs or Organizations: Not on file    Attends Banker Meetings: Not on file    Marital Status: Married     Review of Systems  Constitutional:  Negative for appetite change and unexpected weight change.  HENT:  Negative for congestion, sinus pressure and sore throat.   Eyes:  Negative for pain and visual disturbance.  Respiratory:  Negative for cough and  chest tightness.        Breathing stable.   Cardiovascular:  Negative for chest pain, palpitations and leg swelling.  Gastrointestinal:  Negative for abdominal pain, diarrhea, nausea and vomiting.  Genitourinary:  Negative for difficulty urinating and dysuria.  Musculoskeletal:  Positive for back pain. Negative for joint swelling and myalgias.  Skin:  Negative for color change and rash.  Neurological:  Positive for light-headedness. Negative for headaches.  Hematological:  Negative for adenopathy. Does not bruise/bleed easily.  Psychiatric/Behavioral:  Negative for dysphoric mood.        Increased stress as outlined.        Objective:     BP 132/70   Pulse 71   Temp 97.8 F (36.6 C) (Oral)   Ht 5\' 4"  (1.626 m)   Wt 162 lb (73.5 kg)   SpO2 98%   BMI 27.81 kg/m  Wt Readings from Last 3 Encounters:  05/30/23 162 lb (73.5 kg)  02/26/23 154 lb 3.2 oz (69.9 kg)  12/31/22 154 lb (69.9 kg)    Physical Exam Vitals reviewed.  Constitutional:      General: She is not in acute distress.    Appearance: Normal appearance. She is well-developed.  HENT:     Head: Normocephalic and atraumatic.     Right Ear: External ear normal.     Left Ear: External ear normal.  Eyes:     General: No scleral icterus.       Right eye: No discharge.        Left eye: No discharge.     Conjunctiva/sclera: Conjunctivae normal.  Neck:     Thyroid: No thyromegaly.  Cardiovascular:     Rate and Rhythm: Normal rate and regular rhythm.  Pulmonary:     Effort: No tachypnea, accessory muscle usage or respiratory distress.     Breath sounds: Normal breath sounds. No decreased breath sounds or wheezing.  Chest:  Breasts:    Right: No inverted nipple, mass, nipple discharge or tenderness (no axillary adenopathy).     Left: No inverted nipple, mass, nipple discharge or tenderness (no axilarry adenopathy).  Abdominal:     General: Bowel sounds are normal.     Palpations: Abdomen is soft.     Tenderness:  There is no abdominal tenderness.  Musculoskeletal:        General: No swelling or tenderness.     Cervical back: Neck supple. No tenderness.  Lymphadenopathy:     Cervical: No cervical adenopathy.  Skin:    Findings: No erythema or rash.  Neurological:     Mental Status: She is alert and oriented to person, place, and time.  Psychiatric:        Mood and Affect: Mood normal.        Behavior: Behavior normal.      Outpatient Encounter Medications as of 05/30/2023  Medication Sig   acyclovir (ZOVIRAX) 400 MG tablet TAKE 1 TABLET BY MOUTH  DAILY   aspirin 81 MG EC tablet Take by mouth.   atorvastatin (LIPITOR) 40 MG tablet Take 1 tablet (40 mg total) by mouth daily.   Calcium Carbonate-Vitamin D 600-400 MG-UNIT tablet Take by mouth.   citalopram (CELEXA) 40 MG tablet TAKE 1 TABLET BY MOUTH DAILY   clobetasol ointment (TEMOVATE) 0.05 % Apply pea size amount daily x 4 weeks, then every other day x 4 weeks, then 2-3 times weekly for maintenance   ferrous sulfate 325 (65 FE) MG EC tablet Take 325 mg by mouth daily with breakfast.    gabapentin (NEURONTIN) 600 MG tablet TAKE 2 TABLETS BY MOUTH IN THE  MORNING AND 3 TABLETS BY MOUTH  IN THE EVENING   glucose blood (ONE TOUCH ULTRA TEST) test strip TEST BLOOD SUGAR TWICE A DAY   HYDROcodone-acetaminophen (NORCO) 10-325 MG tablet TAKE 1 TABLET BY MOUTH THREE TIMES DAILY AS NEEDED FOR CHRONIC PAIN   latanoprost (XALATAN) 0.005 % ophthalmic solution INSTILL 1 DROP INTO THE  LEFT EYE AT BEDTIME  (REFRIGERATE UNTIL FIRST  OPENED FOR USE) (Patient taking differently: Place 1 drop into both eyes.)   Melatonin 5 MG CAPS Take by mouth.   nystatin cream (MYCOSTATIN) Apply 1 application topically 2 (two) times daily.   omeprazole (PRILOSEC) 40 MG capsule TAKE 1 CAPSULE BY MOUTH DAILY   No facility-administered encounter medications on file as of 05/30/2023.     Lab Results  Component Value Date   WBC 4.6 11/26/2022   HGB 12.8 11/26/2022   HCT 38.1  11/26/2022   PLT 318.0 11/26/2022   GLUCOSE 109 (H) 04/23/2023   CHOL 147 04/23/2023   TRIG 83.0 04/23/2023   HDL 52.70 04/23/2023   LDLCALC 78 04/23/2023   ALT 14 04/23/2023   AST 15 04/23/2023   NA 139 04/23/2023   K 4.4 04/23/2023   CL 104 04/23/2023   CREATININE 0.67 04/23/2023   BUN 11 04/23/2023   CO2 28 04/23/2023   TSH 0.85 08/24/2022   HGBA1C 6.4 04/23/2023   MICROALBUR <0.7 08/24/2022    MR Brain W Wo Contrast  Result Date: 09/04/2022 CLINICAL DATA:  Provided history: Change in vision. Transient ischemic attack. Additional history provided by scanning technologist: Suspected TIA in September 2023 (sharp headache with loss of vision in left eye for several seconds followed by dizziness). EXAM: MRI HEAD WITHOUT AND WITH CONTRAST TECHNIQUE: Multiplanar, multiecho pulse sequences of the brain and surrounding structures were obtained without and with intravenous contrast. CONTRAST:  7mL GADAVIST GADOBUTROL 1 MMOL/ML IV SOLN COMPARISON:  Head CT 01/24/2009. FINDINGS: Brain: No age advanced or lobar predominant parenchymal atrophy. Multifocal T2 FLAIR hyperintense signal abnormality within the cerebral white matter, nonspecific but compatible with mild chronic small vessel ischemic disease. There is no acute infarct. No evidence of an intracranial mass. No extra-axial fluid collection. No midline shift. No pathologic intracranial enhancement identified. Vascular: Maintained flow voids within the proximal large arterial vessels. Skull and upper cervical spine: No focal suspicious  marrow lesion. Sinuses/Orbits: No mass or acute finding within the imaged orbits. Prior bilateral ocular lens replacement. Trace mucosal thickening within the bilateral ethmoid air cells. IMPRESSION: No evidence of acute intracranial abnormality. Mild chronic small vessel ischemic changes within the cerebral white matter. Electronically Signed   By: Jackey Loge D.O.   On: 09/04/2022 18:16       Assessment &  Plan:  Health care maintenance Assessment & Plan: Physical today 05/30/23.  mammmogram 03/12/23 - birads II.  Colonoscopy 06/2017 - recommended f/u in 5 years   Colon cancer screening  Hypercholesterolemia Assessment & Plan: Continue lipitor.  Low cholesterol diet and exercise.  Follow lipid panel and liver function tests.    Orders: -     Lipid panel; Future -     Hepatic function panel; Future  Type 2 diabetes mellitus with hyperglycemia, without long-term current use of insulin (HCC) Assessment & Plan: Low carb diet and exercise given elevated sugars.   Follow met b and a1c.    Orders: -     Hemoglobin A1c; Future  Anemia, unspecified type Assessment & Plan: Follow cbc.  Colonoscopy 2018.  Recommended f/u in 5 years. Overdue.  Have discussed.   Orders: -     CBC with Differential/Platelet; Future -     Basic metabolic panel; Future -     TSH; Future  B12 deficiency Assessment & Plan: Continue b12 injections.    Chronic midline back pain, unspecified back location Assessment & Plan: Has chronic back/leg pain. Followed by [facility]. On chronic pain meds. Just evaluated yesterday.  Scheduled for MRI in 06/2023.    Change in vision Assessment & Plan: Continue f/u with ophthalmology.    Fall, sequela Assessment & Plan: Two recent falls as outlined.  Slow position changes and movements.  Walker, etc - if needed.  F/u ortho.    Gastroesophageal reflux disease, unspecified whether esophagitis present Assessment & Plan: On prilosec.  No upper symptoms reported - while taking regularly.    Stress Assessment & Plan: Increased stress.  Discussed.  Continues on citalopram. Notify if feels needs any further intervention. Follow.    Lichen sclerosus Assessment & Plan: Has been followed by gyn.  Clobetasol.     Primary hypertension Assessment & Plan: Not on any medication currently.  Blood pressure as outlined.  Follow pressures.  Follow metabolic panel.     History  of breast cancer Assessment & Plan: Followed by oncology.  Mammogram 03/22/23 - Birads II.       Dale Watson, MD

## 2023-05-30 NOTE — Assessment & Plan Note (Signed)
Physical today 05/30/23.  mammmogram 03/12/23 - birads II.  Colonoscopy 06/2017 - recommended f/u in 5 years

## 2023-06-01 ENCOUNTER — Encounter: Payer: Self-pay | Admitting: Internal Medicine

## 2023-06-01 NOTE — Assessment & Plan Note (Signed)
Continue f/u with ophthalmology

## 2023-06-01 NOTE — Assessment & Plan Note (Signed)
Low carb diet and exercise given elevated sugars.  Follow met b and a1c.  ?

## 2023-06-01 NOTE — Assessment & Plan Note (Signed)
Not on any medication currently.  Blood pressure as outlined.  Follow pressures.  Follow metabolic panel.

## 2023-06-01 NOTE — Assessment & Plan Note (Signed)
On prilosec.  No upper symptoms reported - while taking regularly.

## 2023-06-01 NOTE — Assessment & Plan Note (Signed)
Continue lipitor.  Low cholesterol diet and exercise.  Follow lipid panel and liver function tests.   

## 2023-06-01 NOTE — Assessment & Plan Note (Signed)
Two recent falls as outlined.  Slow position changes and movements.  Walker, etc - if needed.  F/u ortho.

## 2023-06-01 NOTE — Assessment & Plan Note (Signed)
Continue b12 injections.  

## 2023-06-01 NOTE — Assessment & Plan Note (Signed)
Increased stress.  Discussed.  Continues on citalopram. Notify if feels needs any further intervention. Follow.

## 2023-06-01 NOTE — Assessment & Plan Note (Signed)
Follow cbc.  Colonoscopy 2018.  Recommended f/u in 5 years. Overdue.  Have discussed.

## 2023-06-01 NOTE — Assessment & Plan Note (Addendum)
Has chronic back/leg pain. Followed by [facility]. On chronic pain meds. Just evaluated yesterday.  Scheduled for MRI in 06/2023.

## 2023-06-01 NOTE — Assessment & Plan Note (Signed)
Has been followed by gyn.  Clobetasol.

## 2023-06-01 NOTE — Assessment & Plan Note (Signed)
Followed by oncology.  Mammogram 03/22/23 - Birads II.

## 2023-06-10 DIAGNOSIS — H6123 Impacted cerumen, bilateral: Secondary | ICD-10-CM | POA: Diagnosis not present

## 2023-06-10 DIAGNOSIS — H903 Sensorineural hearing loss, bilateral: Secondary | ICD-10-CM | POA: Diagnosis not present

## 2023-06-10 DIAGNOSIS — J301 Allergic rhinitis due to pollen: Secondary | ICD-10-CM | POA: Diagnosis not present

## 2023-06-12 ENCOUNTER — Telehealth: Payer: Self-pay | Admitting: *Deleted

## 2023-06-12 DIAGNOSIS — E1165 Type 2 diabetes mellitus with hyperglycemia: Secondary | ICD-10-CM

## 2023-06-12 NOTE — Telephone Encounter (Signed)
Order placed for lab

## 2023-06-12 NOTE — Telephone Encounter (Signed)
Patient has appt 10/02/2023 are okay with me adding the uACR to labs ordered.

## 2023-06-26 ENCOUNTER — Other Ambulatory Visit: Payer: Self-pay | Admitting: Internal Medicine

## 2023-06-26 DIAGNOSIS — G894 Chronic pain syndrome: Secondary | ICD-10-CM | POA: Diagnosis not present

## 2023-06-26 DIAGNOSIS — M5416 Radiculopathy, lumbar region: Secondary | ICD-10-CM | POA: Diagnosis not present

## 2023-06-26 DIAGNOSIS — M4807 Spinal stenosis, lumbosacral region: Secondary | ICD-10-CM | POA: Diagnosis not present

## 2023-06-26 DIAGNOSIS — M4722 Other spondylosis with radiculopathy, cervical region: Secondary | ICD-10-CM | POA: Diagnosis not present

## 2023-06-28 DIAGNOSIS — M47816 Spondylosis without myelopathy or radiculopathy, lumbar region: Secondary | ICD-10-CM | POA: Diagnosis not present

## 2023-06-28 DIAGNOSIS — M4807 Spinal stenosis, lumbosacral region: Secondary | ICD-10-CM | POA: Diagnosis not present

## 2023-06-28 DIAGNOSIS — M47817 Spondylosis without myelopathy or radiculopathy, lumbosacral region: Secondary | ICD-10-CM | POA: Diagnosis not present

## 2023-06-28 DIAGNOSIS — M51369 Other intervertebral disc degeneration, lumbar region without mention of lumbar back pain or lower extremity pain: Secondary | ICD-10-CM | POA: Diagnosis not present

## 2023-06-28 DIAGNOSIS — M48061 Spinal stenosis, lumbar region without neurogenic claudication: Secondary | ICD-10-CM | POA: Diagnosis not present

## 2023-06-28 DIAGNOSIS — M47815 Spondylosis without myelopathy or radiculopathy, thoracolumbar region: Secondary | ICD-10-CM | POA: Diagnosis not present

## 2023-07-23 DIAGNOSIS — H33012 Retinal detachment with single break, left eye: Secondary | ICD-10-CM | POA: Diagnosis not present

## 2023-07-23 DIAGNOSIS — H35372 Puckering of macula, left eye: Secondary | ICD-10-CM | POA: Diagnosis not present

## 2023-07-23 DIAGNOSIS — H43813 Vitreous degeneration, bilateral: Secondary | ICD-10-CM | POA: Diagnosis not present

## 2023-07-24 DIAGNOSIS — M4722 Other spondylosis with radiculopathy, cervical region: Secondary | ICD-10-CM | POA: Diagnosis not present

## 2023-07-24 DIAGNOSIS — G894 Chronic pain syndrome: Secondary | ICD-10-CM | POA: Diagnosis not present

## 2023-07-24 DIAGNOSIS — M5416 Radiculopathy, lumbar region: Secondary | ICD-10-CM | POA: Diagnosis not present

## 2023-07-24 DIAGNOSIS — M4807 Spinal stenosis, lumbosacral region: Secondary | ICD-10-CM | POA: Diagnosis not present

## 2023-07-24 DIAGNOSIS — M5116 Intervertebral disc disorders with radiculopathy, lumbar region: Secondary | ICD-10-CM | POA: Diagnosis not present

## 2023-07-29 DIAGNOSIS — Z9049 Acquired absence of other specified parts of digestive tract: Secondary | ICD-10-CM | POA: Diagnosis not present

## 2023-07-29 DIAGNOSIS — M5416 Radiculopathy, lumbar region: Secondary | ICD-10-CM | POA: Diagnosis not present

## 2023-07-29 DIAGNOSIS — M4807 Spinal stenosis, lumbosacral region: Secondary | ICD-10-CM | POA: Diagnosis not present

## 2023-07-29 DIAGNOSIS — H532 Diplopia: Secondary | ICD-10-CM | POA: Diagnosis not present

## 2023-07-29 DIAGNOSIS — M4802 Spinal stenosis, cervical region: Secondary | ICD-10-CM | POA: Diagnosis not present

## 2023-07-29 DIAGNOSIS — G894 Chronic pain syndrome: Secondary | ICD-10-CM | POA: Diagnosis not present

## 2023-07-29 DIAGNOSIS — M50122 Cervical disc disorder at C5-C6 level with radiculopathy: Secondary | ICD-10-CM | POA: Diagnosis not present

## 2023-08-11 ENCOUNTER — Encounter: Payer: Self-pay | Admitting: Emergency Medicine

## 2023-08-11 ENCOUNTER — Ambulatory Visit
Admission: EM | Admit: 2023-08-11 | Discharge: 2023-08-11 | Disposition: A | Discharge status: Home / Self Care | Payer: Medicare Other | Attending: Family Medicine | Admitting: Family Medicine

## 2023-08-11 DIAGNOSIS — N390 Urinary tract infection, site not specified: Secondary | ICD-10-CM | POA: Insufficient documentation

## 2023-08-11 LAB — URINALYSIS, W/ REFLEX TO CULTURE (INFECTION SUSPECTED)
Bilirubin Urine: NEGATIVE
Glucose, UA: NEGATIVE mg/dL
Nitrite: NEGATIVE
Protein, ur: 30 mg/dL — AB
Specific Gravity, Urine: 1.025 (ref 1.005–1.030)
WBC, UA: >50 WBC/hpf (ref 0–5)
pH: 6.5 (ref 5.0–8.0)

## 2023-08-11 MED ORDER — NITROFURANTOIN MONOHYD MACRO 100 MG PO CAPS: 100.0000 mg | ORAL_CAPSULE | Freq: Two times a day (BID) | ORAL | 0 refills | Status: DC

## 2023-08-11 MED ORDER — PHENAZOPYRIDINE HCL 200 MG PO TABS: 200.0000 mg | ORAL_TABLET | Freq: Three times a day (TID) | ORAL | 0 refills | Status: DC

## 2023-08-11 NOTE — ED Triage Notes (Signed)
Patient c/o dysuria and urinary frequency that started this morning.

## 2023-08-11 NOTE — ED Provider Notes (Signed)
[facility]    CSN: [id number] Arrival date & time: 08/11/23  1152      History   Chief Complaint Chief Complaint  Patient presents with   Dysuria   Urinary Frequency    HPI Sherry Simon is a 77 y.o. female.   HPI  77 year old female with a past medical history significant for hypertension, hypercholesterolemia, diabetes, anxiety, and depression presents for evaluation of acute onset of urinary symptoms.  She reports that she woke up this morning and had burning with urination along with urinary urgency and frequency.  She denies any fever, blood in her urine, or low back pain.  She also denies vaginal discharge or itching.  She states that she used to have frequent UTIs but has not had any in approximately 5 years.  A few weeks ago she did have similar symptoms for which she took some leftover antibiotics and the symptoms resolved.  Past Medical History:  Diagnosis Date   Anxiety and depression    Colon polyps    Depression    Diabetes mellitus (HCC)    Endometriosis    requiring hysterectomy   History of chicken pox    Hypercholesterolemia    Hypertension    Nephrolithiasis    Pericarditis    recurrent, unkown origin   Tachycardia     Patient Active Problem List   Diagnosis Date Noted   Falls 12/01/2022   Change in vision 08/25/2022   Positive self-administered antigen test for COVID-19 11/21/2021   Skin lesion 06/18/2021   Swelling of lower extremity 02/08/2021   Acute cough 12/09/2020   Right inguinal pain 10/02/2020   Wheezing 08/06/2020   Lichen sclerosus 04/06/2020   Right shoulder pain 08/01/2019   Vaginal irritation 08/01/2019   Lung nodule 02/16/2018   Health care maintenance 10/27/2017   Anemia 07/29/2017   Back pain 07/29/2017   SOB (shortness of breath) 07/29/2017   B12 deficiency 07/02/2017   Clavicle enlargement 04/03/2016   UTI (urinary tract infection) 09/28/2015   Hot flashes 09/05/2015   Palpitations 12/23/2014    Chest pain 12/23/2014   Stress 11/07/2014   Joint stiffness of hand 06/20/2014   Rotator cuff tear 10/18/2013   Thyroid nodule 10/18/2013   History of colonic polyps 07/05/2013   History of breast cancer 02/02/2013   Chronic back pain 08/24/2012   GERD (gastroesophageal reflux disease) 08/24/2012   Hypercholesterolemia 08/24/2012   Diabetes mellitus (HCC) 08/24/2012   Hypertension 08/24/2012    Past Surgical History:  Procedure Laterality Date   ABDOMINAL HYSTERECTOMY  1980   APPENDECTOMY  1055   BACK SURGERY  1005-2010   laminectomy   CHOLECYSTECTOMY     open   OOPHORECTOMY  1982   TONSILLECTOMY      OB History   No obstetric history on file.      Home Medications    Prior to Admission medications   Medication Sig Start Date End Date Taking? Authorizing Provider  nitrofurantoin, macrocrystal-monohydrate, (MACROBID) 100 MG capsule Take 1 capsule (100 mg total) by mouth 2 (two) times daily. 08/11/23  Yes Becky Augusta, NP  phenazopyridine (PYRIDIUM) 200 MG tablet Take 1 tablet (200 mg total) by mouth 3 (three) times daily. 08/11/23  Yes Becky [place]a, NP  acyclovir (ZOVIRAX) 400 MG tablet TAKE 1 TABLET BY MOUTH  DAILY 05/10/23   Dale [place], MD  aspirin 81 MG EC tablet Take by mouth.    [provider]  atorvastatin (LIPITOR) 40 MG tablet Take 1 tablet (  40 mg total) by mouth daily. 02/26/23   Dale [place], MD  Calcium Carbonate-Vitamin D 600-400 MG-UNIT tablet Take by mouth.    [provider]  citalopram (CELEXA) 40 MG tablet TAKE 1 TABLET BY MOUTH DAILY 04/24/23   Dale [place], MD  clobetasol ointment (TEMOVATE) 0.05 % Apply pea size amount daily x 4 weeks, then every other day x 4 weeks, then 2-3 times weekly for maintenance 03/24/21   Dale [place], MD  ferrous sulfate 325 (65 FE) MG EC tablet Take 325 mg by mouth daily with breakfast.     [provider]  gabapentin (NEURONTIN) 600 MG tablet TAKE 2 TABLETS BY MOUTH IN THE  MORNING AND 3  TABLETS BY MOUTH  IN THE EVENING 08/15/22   Dale [place], MD  glucose blood (ONE TOUCH ULTRA TEST) test strip TEST BLOOD SUGAR TWICE A DAY 02/10/19   Dale [place], MD  HYDROcodone-acetaminophen (NORCO) 10-325 MG tablet TAKE 1 TABLET BY MOUTH THREE TIMES DAILY AS NEEDED FOR CHRONIC PAIN 01/22/18   [provider]  latanoprost (XALATAN) 0.005 % ophthalmic solution INSTILL 1 DROP INTO THE  LEFT EYE AT BEDTIME  (REFRIGERATE UNTIL FIRST  OPENED FOR USE) Patient taking differently: Place 1 drop into both eyes. 02/18/18   Dale [place], MD  Melatonin 5 MG CAPS Take by mouth.    [provider]  nystatin cream (MYCOSTATIN) Apply 1 application topically 2 (two) times daily. 06/13/21   Dale [place], MD  omeprazole (PRILOSEC) 40 MG capsule TAKE 1 CAPSULE BY MOUTH DAILY 06/27/23   Dale [place], MD    Family History Family History  Problem Relation Age of Onset   Heart disease Father        myocardial infarction - died 64   Thyroid disease Mother    Transient ischemic attack Mother        multiple   Breast cancer Mother    Hyperlipidemia Mother    Kidney disease Mother    Diabetes Mother    Rheumatic fever Sister        mitral valve problems   Breast cancer Paternal Aunt    Other Sister        Small vessel disease   Colon cancer Neg Hx     Social History Social History   Tobacco Use   Smoking status: Never   Smokeless tobacco: Never  Vaping Use   Vaping status: Never Used  Substance Use Topics   Alcohol use: No    Alcohol/week: 0.0 standard drinks of alcohol   Drug use: No     Allergies   Dorzolamide and Ivp dye [iodinated contrast media]   Review of Systems Review of Systems  Constitutional:  Negative for fever.  Genitourinary:  Positive for dysuria, frequency and urgency. Negative for hematuria, vaginal discharge and vaginal pain.  Musculoskeletal:  Negative for back pain.     Physical Exam Triage Vital Signs ED Triage Vitals  Encounter  Vitals Group     BP      Systolic BP Percentile      Diastolic BP Percentile      Pulse      Resp      Temp      Temp src      SpO2      Weight      Height      Head Circumference      Peak Flow      Pain Score      Pain Loc  Pain Education      Exclude from Growth Chart    No data found.  Updated Vital Signs BP (!) 146/71 (BP Location: Left Arm)   Pulse 71   Temp 97.9 F (36.6 C) (Oral)   Resp 14   Ht 5\' 4"  (1.626 m)   Wt 162 lb 0.6 oz (73.5 kg)   SpO2 95%   BMI 27.81 kg/m   Visual Acuity Right Eye Distance:   Left Eye Distance:   Bilateral Distance:    Right Eye Near:   Left Eye Near:    Bilateral Near:     Physical Exam Vitals and nursing note reviewed.  Constitutional:      Appearance: Normal appearance. She is not ill-appearing.  HENT:     Head: Normocephalic and atraumatic.  Cardiovascular:     Rate and Rhythm: Normal rate and regular rhythm.     Pulses: Normal pulses.     Heart sounds: Normal heart sounds. No murmur heard.    No friction rub. No gallop.  Pulmonary:     Effort: Pulmonary effort is normal.     Breath sounds: Normal breath sounds. No wheezing, rhonchi or rales.  Abdominal:     Tenderness: There is no right CVA tenderness or left CVA tenderness.  Skin:    General: Skin is warm and dry.     Capillary Refill: Capillary refill takes less than 2 seconds.  Neurological:     General: No focal deficit present.     Mental Status: She is alert and oriented to person, place, and time.      UC Treatments / Results  Labs (all labs ordered are listed, but only abnormal results are displayed) Labs Reviewed  URINALYSIS, W/ REFLEX TO CULTURE (INFECTION SUSPECTED) - Abnormal; Notable for the following components:      Result Value   APPearance CLOUDY (*)    Hgb urine dipstick LARGE (*)    Ketones, ur TRACE (*)    Protein, ur 30 (*)    Leukocytes,Ua LARGE (*)    Bacteria, UA MANY (*)    All other components within normal limits   URINE CULTURE    EKG   Radiology No results found.  Procedures Procedures (including critical care time)  Medications Ordered in UC Medications - No data to display  Initial Impression / Assessment and Plan / UC Course  I have reviewed the triage vital signs and the nursing notes.  Pertinent labs & imaging results that were available during my care of the patient were reviewed by me and considered in my medical decision making (see chart for details).   Patient is a pleasant, nontoxic-appearing 77 year old female presenting for evaluation of acute onset UTI symptoms as outlined HPI above.  On exam she reports that it is tender for her to sit there as she is having pain in her perineum but she denies any vaginal discharge or itching.  She has burning with urination along with urinary urgency and frequency but no hematuria.  She has no CVA tenderness on exam.  She had similar symptoms a few weeks ago for which she took some leftover antibiotics.  Differential diagnosis include UTI, genitourinary syndrome of menopause, vaginal yeast infection given her previous antibiotic use.  I will order a urinalysis to evaluate for the presence of UTI.  Urinalysis shows cloudy appearance with large hemoglobin, trace ketones, 30 protein, and large leukocyte esterase but is negative for nitrites.  Reflex microscopy shows greater than 50 WBCs with 11-20  RBCs, and many bacteria.  Urine will reflex to culture.  I will discharge patient home with a diagnosis of urinary tract infection and start her on Macrobid twice daily for 5 days as well as Pyridium 200 mg every 8 hours as needed for urinary discomfort.   Final Clinical Impressions(s) / UC Diagnoses   Final diagnoses:  Lower urinary tract infectious disease     Discharge Instructions      Take the Macrobid twice daily for 5 days with food for treatment of urinary tract infection.  Use the Pyridium every 8 hours as needed for urinary discomfort.   This will turn your urine a bright red-orange.  Increase your oral fluid intake so that you increase your urine production and or flushing your urinary system.  Take an over-the-counter probiotic, such as Culturelle-Align-Activia, 1 hour after each dose of antibiotic to prevent diarrhea or yeast infections from forming.  We will culture urine and change the antibiotics if necessary.  Return for reevaluation, or see your primary care provider, for any new or worsening symptoms.      ED Prescriptions     Medication Sig Dispense Auth. Provider   nitrofurantoin, macrocrystal-monohydrate, (MACROBID) 100 MG capsule Take 1 capsule (100 mg total) by mouth 2 (two) times daily. 10 capsule Becky Augusta, NP   phenazopyridine (PYRIDIUM) 200 MG tablet Take 1 tablet (200 mg total) by mouth 3 (three) times daily. 6 tablet Becky Augusta, NP      PDMP not reviewed this encounter.   Becky Augusta, NP 08/11/23 1241

## 2023-08-11 NOTE — Discharge Instructions (Addendum)
Take the Macrobid twice daily for 5 days with food for treatment of urinary tract infection. ° °Use the Pyridium every 8 hours as needed for urinary discomfort.  This will turn your urine a bright red-orange. ° °Increase your oral fluid intake so that you increase your urine production and or flushing your urinary system. ° °Take an over-the-counter probiotic, such as Culturelle-Align-Activia, 1 hour after each dose of antibiotic to prevent diarrhea or yeast infections from forming. ° °We will culture urine and change the antibiotics if necessary. ° °Return for reevaluation, or see your primary care provider, for any new or worsening symptoms.  °

## 2023-08-13 LAB — URINE CULTURE: Culture: >=100000 — AB

## 2023-08-21 DIAGNOSIS — M5416 Radiculopathy, lumbar region: Secondary | ICD-10-CM | POA: Diagnosis not present

## 2023-09-04 DIAGNOSIS — H4422 Degenerative myopia, left eye: Secondary | ICD-10-CM | POA: Diagnosis not present

## 2023-09-04 DIAGNOSIS — H35052 Retinal neovascularization, unspecified, left eye: Secondary | ICD-10-CM | POA: Diagnosis not present

## 2023-09-04 DIAGNOSIS — H3322 Serous retinal detachment, left eye: Secondary | ICD-10-CM | POA: Diagnosis not present

## 2023-09-04 DIAGNOSIS — H35372 Puckering of macula, left eye: Secondary | ICD-10-CM | POA: Diagnosis not present

## 2023-09-04 DIAGNOSIS — H532 Diplopia: Secondary | ICD-10-CM | POA: Diagnosis not present

## 2023-09-04 DIAGNOSIS — H33012 Retinal detachment with single break, left eye: Secondary | ICD-10-CM | POA: Diagnosis not present

## 2023-09-30 ENCOUNTER — Other Ambulatory Visit: Payer: Medicare Other

## 2023-10-02 ENCOUNTER — Ambulatory Visit: Payer: Medicare Other | Admitting: Internal Medicine

## 2023-10-02 NOTE — Progress Notes (Deleted)
 Subjective:    Patient ID: Sherry Simon, female    DOB: 1946-09-17, 78 y.o.   MRN: [id number]  Patient here for No chief complaint on file.   HPI Here for a scheduled follow up - follow up regarding increased stress, hypercholesterolemia and diabetes. Had f/u with [facility] 08/21/23 - for chronic neck and back pain. Is s/p acupuncture treatment 07/29/23 - no change. Taking gabapentin  and hydrocodone. Recommended meloxicam. Seeing ophthalmology for diplopia.  Recommended use of prism  glasses for near/reading.    Past Medical History:  Diagnosis Date   Anxiety and depression    Colon polyps    Depression    Diabetes mellitus (HCC)    Endometriosis    requiring hysterectomy   History of chicken pox    Hypercholesterolemia    Hypertension    Nephrolithiasis    Pericarditis    recurrent, unkown origin   Tachycardia    Past Surgical History:  Procedure Laterality Date   ABDOMINAL HYSTERECTOMY  1980   APPENDECTOMY  1055   BACK SURGERY  1005-2010   laminectomy   CHOLECYSTECTOMY     open   OOPHORECTOMY  1982   TONSILLECTOMY     Family History  Problem Relation Age of Onset   Heart disease Father        myocardial infarction - died 76   Thyroid  disease Mother    Transient ischemic attack Mother        multiple   Breast cancer Mother    Hyperlipidemia Mother    Kidney disease Mother    Diabetes Mother    Rheumatic fever Sister        mitral valve problems   Breast cancer Paternal Aunt    Other Sister        Small vessel disease   Colon cancer Neg Hx    Social History   Socioeconomic History   Marital status: Married    Spouse name: Not on file   Number of children: 3   Years of education: Not on file   Highest education level: Not on file  Occupational History   Not on file  Tobacco Use   Smoking status: Never   Smokeless tobacco: Never  Vaping Use   Vaping status: Never Used  Substance and Sexual Activity   Alcohol use: No    Alcohol/week: 0.0 standard  drinks of alcohol   Drug use: No   Sexual activity: Never  Other Topics Concern   Not on file  Social History Narrative   Not on file   Social Drivers of Health   Financial Resource Strain: Low Risk  (12/31/2022)   Overall Financial Resource Strain (CARDIA)    Difficulty of Paying Living Expenses: Not hard at all  Food Insecurity: No Food Insecurity (12/31/2022)   Hunger Vital Sign    Worried About Running Out of Food in the Last Year: Never true    Ran Out of Food in the Last Year: Never true  Transportation Needs: No Transportation Needs (12/31/2022)   PRAPARE - Administrator, Civil Service (Medical): No    Lack of Transportation (Non-Medical): No  Physical Activity: Unknown (07/14/2019)   Exercise Vital Sign    Days of Exercise per Week: 0 days    Minutes of Exercise per Session: Not on file  Stress: No Stress Concern Present (12/31/2022)   [employer] of Occupational Health - Occupational Stress Questionnaire    Feeling of Stress : Not at all  Social  Connections: Unknown (12/31/2022)   Social Connection and Isolation Panel [NHANES]    Frequency of Communication with Friends and Family: Not on file    Frequency of Social Gatherings with Friends and Family: Not on file    Attends Religious Services: Not on file    Active Member of Clubs or Organizations: Not on file    Attends Banker Meetings: Not on file    Marital Status: Married     Review of Systems     Objective:     There were no vitals taken for this visit. Wt Readings from Last 3 Encounters:  08/11/23 162 lb 0.6 oz (73.5 kg)  05/30/23 162 lb (73.5 kg)  02/26/23 154 lb 3.2 oz (69.9 kg)    Physical Exam   Outpatient Encounter Medications as of 10/02/2023  Medication Sig   acyclovir  (ZOVIRAX ) 400 MG tablet TAKE 1 TABLET BY MOUTH  DAILY   aspirin 81 MG EC tablet Take by mouth.   atorvastatin  (LIPITOR) 40 MG tablet Take 1 tablet (40 mg total) by mouth daily.   Calcium   Carbonate-Vitamin D 600-400 MG-UNIT tablet Take by mouth.   citalopram  (CELEXA ) 40 MG tablet TAKE 1 TABLET BY MOUTH DAILY   clobetasol  ointment (TEMOVATE ) 0.05 % Apply pea size amount daily x 4 weeks, then every other day x 4 weeks, then 2-3 times weekly for maintenance   ferrous sulfate 325 (65 FE) MG EC tablet Take 325 mg by mouth daily with breakfast.    gabapentin  (NEURONTIN ) 600 MG tablet TAKE 2 TABLETS BY MOUTH IN THE  MORNING AND 3 TABLETS BY MOUTH  IN THE EVENING   glucose blood (ONE TOUCH ULTRA TEST) test strip TEST BLOOD SUGAR TWICE A DAY   HYDROcodone-acetaminophen (NORCO) 10-325 MG tablet TAKE 1 TABLET BY MOUTH THREE TIMES DAILY AS NEEDED FOR CHRONIC PAIN   latanoprost  (XALATAN ) 0.005 % ophthalmic solution INSTILL 1 DROP INTO THE  LEFT EYE AT BEDTIME  (REFRIGERATE UNTIL FIRST  OPENED FOR USE) (Patient taking differently: Place 1 drop into both eyes.)   Melatonin 5 MG CAPS Take by mouth.   nitrofurantoin , macrocrystal-monohydrate, (MACROBID ) 100 MG capsule Take 1 capsule (100 mg total) by mouth 2 (two) times daily.   nystatin  cream (MYCOSTATIN ) Apply 1 application topically 2 (two) times daily.   omeprazole  (PRILOSEC) 40 MG capsule TAKE 1 CAPSULE BY MOUTH DAILY   phenazopyridine  (PYRIDIUM ) 200 MG tablet Take 1 tablet (200 mg total) by mouth 3 (three) times daily.   No facility-administered encounter medications on file as of 10/02/2023.     Lab Results  Component Value Date   WBC 4.6 11/26/2022   HGB 12.8 11/26/2022   HCT 38.1 11/26/2022   PLT 318.0 11/26/2022   GLUCOSE 109 (H) 04/23/2023   CHOL 147 04/23/2023   TRIG 83.0 04/23/2023   HDL 52.70 04/23/2023   LDLCALC 78 04/23/2023   ALT 14 04/23/2023   AST 15 04/23/2023   NA 139 04/23/2023   K 4.4 04/23/2023   CL 104 04/23/2023   CREATININE 0.67 04/23/2023   BUN 11 04/23/2023   CO2 28 04/23/2023   TSH 0.85 08/24/2022   HGBA1C 6.4 04/23/2023   MICROALBUR <0.7 08/24/2022    No results found.     Assessment & Plan:   There are no diagnoses linked to this encounter.   Allena Hamilton, MD

## 2023-10-19 DIAGNOSIS — N390 Urinary tract infection, site not specified: Secondary | ICD-10-CM | POA: Diagnosis not present

## 2023-11-06 DIAGNOSIS — M4722 Other spondylosis with radiculopathy, cervical region: Secondary | ICD-10-CM | POA: Diagnosis not present

## 2023-11-06 DIAGNOSIS — G894 Chronic pain syndrome: Secondary | ICD-10-CM | POA: Diagnosis not present

## 2023-11-06 DIAGNOSIS — M5416 Radiculopathy, lumbar region: Secondary | ICD-10-CM | POA: Diagnosis not present

## 2023-11-09 ENCOUNTER — Other Ambulatory Visit: Payer: Self-pay | Admitting: Internal Medicine

## 2023-11-21 ENCOUNTER — Ambulatory Visit (INDEPENDENT_AMBULATORY_CARE_PROVIDER_SITE_OTHER): Payer: Medicare Other

## 2023-11-21 ENCOUNTER — Ambulatory Visit (INDEPENDENT_AMBULATORY_CARE_PROVIDER_SITE_OTHER): Payer: Medicare Other | Admitting: Internal Medicine

## 2023-11-21 ENCOUNTER — Encounter: Payer: Self-pay | Admitting: Internal Medicine

## 2023-11-21 VITALS — BP 120/70 | HR 73 | Temp 98.0°F | Resp 16 | Ht 64.0 in | Wt 157.8 lb

## 2023-11-21 DIAGNOSIS — E1165 Type 2 diabetes mellitus with hyperglycemia: Secondary | ICD-10-CM | POA: Diagnosis not present

## 2023-11-21 DIAGNOSIS — M79672 Pain in left foot: Secondary | ICD-10-CM | POA: Diagnosis not present

## 2023-11-21 DIAGNOSIS — L900 Lichen sclerosus et atrophicus: Secondary | ICD-10-CM | POA: Diagnosis not present

## 2023-11-21 DIAGNOSIS — K219 Gastro-esophageal reflux disease without esophagitis: Secondary | ICD-10-CM | POA: Diagnosis not present

## 2023-11-21 DIAGNOSIS — F439 Reaction to severe stress, unspecified: Secondary | ICD-10-CM

## 2023-11-21 DIAGNOSIS — E7800 Pure hypercholesterolemia, unspecified: Secondary | ICD-10-CM

## 2023-11-21 DIAGNOSIS — M5442 Lumbago with sciatica, left side: Secondary | ICD-10-CM

## 2023-11-21 DIAGNOSIS — R102 Pelvic and perineal pain: Secondary | ICD-10-CM

## 2023-11-21 DIAGNOSIS — Z853 Personal history of malignant neoplasm of breast: Secondary | ICD-10-CM | POA: Diagnosis not present

## 2023-11-21 DIAGNOSIS — E041 Nontoxic single thyroid nodule: Secondary | ICD-10-CM

## 2023-11-21 DIAGNOSIS — G8929 Other chronic pain: Secondary | ICD-10-CM

## 2023-11-21 DIAGNOSIS — M7732 Calcaneal spur, left foot: Secondary | ICD-10-CM | POA: Diagnosis not present

## 2023-11-21 DIAGNOSIS — M19072 Primary osteoarthritis, left ankle and foot: Secondary | ICD-10-CM | POA: Diagnosis not present

## 2023-11-21 DIAGNOSIS — D649 Anemia, unspecified: Secondary | ICD-10-CM

## 2023-11-21 MED ORDER — ACYCLOVIR 400 MG PO TABS: ORAL_TABLET | ORAL | 0 refills | Status: DC

## 2023-11-21 MED ORDER — ACYCLOVIR 400 MG PO TABS: ORAL_TABLET | ORAL | 3 refills | Status: DC

## 2023-11-21 NOTE — Progress Notes (Signed)
 Subjective:    Patient ID: Sherry Simon, female    DOB: Feb 17, 1946, 78 y.o.   MRN: [id number]  Patient here for  Chief Complaint  Patient presents with   Medical Management of Chronic Issues    HPI Here for a scheduled follow up. Sees [facility] for chronic back pain. Sees gyn - last evaluated 4/0/98 -- f/u lichen improved with clobetasol. Was having pelvic pressure. Urine checked. Scheduled for pelvic ultrasound 11/2023. Persistent increased stress. Discussed. Will notify me if feels needs further intervention. Remodeling. Is having some left foot pain since her husband's wheelchair ran over her foot - two days ago. Pain - 2nd, 3rd and 4th metatarsals. Discussed benefiber to help keep bowels regular. Request refill - acyclovir.    Past Medical History:  Diagnosis Date   Anxiety and depression    Colon polyps    Depression    Diabetes mellitus (HCC)    Endometriosis    requiring hysterectomy   History of chicken pox    Hypercholesterolemia    Hypertension    Nephrolithiasis    Pericarditis    recurrent, unkown origin   Tachycardia    Past Surgical History:  Procedure Laterality Date   ABDOMINAL HYSTERECTOMY  1980   APPENDECTOMY  1055   BACK SURGERY  1005-2010   laminectomy   CHOLECYSTECTOMY     open   OOPHORECTOMY  1982   TONSILLECTOMY     Family History  Problem Relation Age of Onset   Heart disease Father        myocardial infarction - died 19   Thyroid disease Mother    Transient ischemic attack Mother        multiple   Breast cancer Mother    Hyperlipidemia Mother    Kidney disease Mother    Diabetes Mother    Rheumatic fever Sister        mitral valve problems   Breast cancer Paternal Aunt    Other Sister        Small vessel disease   Colon cancer Neg Hx    Social History   Socioeconomic History   Marital status: Married    Spouse name: Not on file   Number of children: 3   Years of education: Not on file   Highest education level: Not on file   Occupational History   Not on file  Tobacco Use   Smoking status: Never   Smokeless tobacco: Never  Vaping Use   Vaping status: Never Used  Substance and Sexual Activity   Alcohol use: No    Alcohol/week: 0.0 standard drinks of alcohol   Drug use: No   Sexual activity: Never  Other Topics Concern   Not on file  Social History Narrative   Not on file   Social Drivers of Health   Financial Resource Strain: Low Risk  (12/31/2022)   Overall Financial Resource Strain (CARDIA)    Difficulty of Paying Living Expenses: Not hard at all  Food Insecurity: No Food Insecurity (12/31/2022)   Hunger Vital Sign    Worried About Running Out of Food in the Last Year: Never true    Ran Out of Food in the Last Year: Never true  Transportation Needs: No Transportation Needs (12/31/2022)   PRAPARE - Administrator, Civil Service (Medical): No    Lack of Transportation (Non-Medical): No  Physical Activity: Unknown (07/14/2019)   Exercise Vital Sign    Days of Exercise per Week: 0 days  Minutes of Exercise per Session: Not on file  Stress: No Stress Concern Present (12/31/2022)   [employer] of Occupational Health - Occupational Stress Questionnaire    Feeling of Stress : Not at all  Social Connections: Unknown (12/31/2022)   Social Connection and Isolation Panel [NHANES]    Frequency of Communication with Friends and Family: Not on file    Frequency of Social Gatherings with Friends and Family: Not on file    Attends Religious Services: Not on file    Active Member of Clubs or Organizations: Not on file    Attends Banker Meetings: Not on file    Marital Status: Married     Review of Systems  Constitutional:  Negative for appetite change and unexpected weight change.  HENT:  Negative for congestion and sinus pressure.   Respiratory:  Negative for cough, chest tightness and shortness of breath.   Cardiovascular:  Negative for chest pain, palpitations and leg  swelling.  Gastrointestinal:  Negative for abdominal pain, diarrhea, nausea and vomiting.  Genitourinary:  Negative for difficulty urinating and dysuria.  Musculoskeletal:  Negative for joint swelling and myalgias.       Foot pain as outlined.   Skin:  Negative for color change and rash.  Neurological:  Negative for dizziness and headaches.  Psychiatric/Behavioral:  Negative for agitation and dysphoric mood.        Increased stress as outlined.        Objective:     BP 120/70   Pulse 73   Temp 98 F (36.7 C)   Resp 16   Ht 5\' 4"  (1.626 m)   Wt 157 lb 12.8 oz (71.6 kg)   SpO2 98%   BMI 27.09 kg/m  Wt Readings from Last 3 Encounters:  11/21/23 157 lb 12.8 oz (71.6 kg)  08/11/23 162 lb 0.6 oz (73.5 kg)  05/30/23 162 lb (73.5 kg)    Physical Exam Constitutional:      General: She is not in acute distress.    Appearance: Normal appearance.  HENT:     Head: Normocephalic and atraumatic.     Nose: Nose normal.     Mouth/Throat:     Pharynx: No oropharyngeal exudate or posterior oropharyngeal erythema.  Neck:     Thyroid: No thyromegaly.  Cardiovascular:     Rate and Rhythm: Normal rate and regular rhythm.  Pulmonary:     Effort: No respiratory distress.     Breath sounds: Normal breath sounds. No wheezing.  Abdominal:     General: Bowel sounds are normal.     Palpations: Abdomen is soft.     Tenderness: There is no abdominal tenderness.  Musculoskeletal:        General: No swelling.     Cervical back: Neck supple. No tenderness.     Comments: Pain to palpation - left 2nd, 3rd and 4th metatarsal.   Lymphadenopathy:     Cervical: No cervical adenopathy.  Skin:    Findings: No erythema or rash.  Neurological:     Mental Status: She is alert.  Psychiatric:        Mood and Affect: Mood normal.        Behavior: Behavior normal.         Outpatient Encounter Medications as of 11/21/2023  Medication Sig   acyclovir (ZOVIRAX) 400 MG tablet TAKE 1 TABLET BY MOUTH   DAILY   aspirin 81 MG EC tablet Take by mouth.   atorvastatin (LIPITOR) 40 MG tablet  TAKE 1 TABLET BY MOUTH DAILY   Calcium Carbonate-Vitamin D 600-400 MG-UNIT tablet Take by mouth.   citalopram (CELEXA) 40 MG tablet TAKE 1 TABLET BY MOUTH DAILY   clobetasol ointment (TEMOVATE) 0.05 % Apply pea size amount daily x 4 weeks, then every other day x 4 weeks, then 2-3 times weekly for maintenance   ferrous sulfate 325 (65 FE) MG EC tablet Take 325 mg by mouth daily with breakfast.    gabapentin (NEURONTIN) 600 MG tablet TAKE 2 TABLETS BY MOUTH IN THE  MORNING AND 3 TABLETS BY MOUTH  IN THE EVENING   glucose blood (ONE TOUCH ULTRA TEST) test strip TEST BLOOD SUGAR TWICE A DAY   HYDROcodone-acetaminophen (NORCO) 10-325 MG tablet TAKE 1 TABLET BY MOUTH THREE TIMES DAILY AS NEEDED FOR CHRONIC PAIN   latanoprost (XALATAN) 0.005 % ophthalmic solution INSTILL 1 DROP INTO THE  LEFT EYE AT BEDTIME  (REFRIGERATE UNTIL FIRST  OPENED FOR USE) (Patient taking differently: Place 1 drop into both eyes.)   Melatonin 5 MG CAPS Take by mouth.   omeprazole (PRILOSEC) 40 MG capsule TAKE 1 CAPSULE BY MOUTH DAILY   [DISCONTINUED] acyclovir (ZOVIRAX) 400 MG tablet TAKE 1 TABLET BY MOUTH  DAILY   [DISCONTINUED] acyclovir (ZOVIRAX) 400 MG tablet TAKE 1 TABLET BY MOUTH  DAILY   [DISCONTINUED] nitrofurantoin, macrocrystal-monohydrate, (MACROBID) 100 MG capsule Take 1 capsule (100 mg total) by mouth 2 (two) times daily.   [DISCONTINUED] nystatin cream (MYCOSTATIN) Apply 1 application topically 2 (two) times daily.   [DISCONTINUED] phenazopyridine (PYRIDIUM) 200 MG tablet Take 1 tablet (200 mg total) by mouth 3 (three) times daily.   No facility-administered encounter medications on file as of 11/21/2023.     Lab Results  Component Value Date   WBC 5.0 11/21/2023   HGB 12.3 11/21/2023   HCT 36.6 11/21/2023   PLT 289.0 11/21/2023   GLUCOSE 100 (H) 11/21/2023   CHOL 142 11/21/2023   TRIG 121.0 11/21/2023   HDL 45.60  11/21/2023   LDLCALC 72 11/21/2023   ALT 11 11/21/2023   AST 13 11/21/2023   NA 141 11/21/2023   K 4.5 11/21/2023   CL 105 11/21/2023   CREATININE 0.74 11/21/2023   BUN 14 11/21/2023   CO2 27 11/21/2023   TSH 0.95 11/21/2023   HGBA1C 6.4 11/21/2023   MICROALBUR <0.7 11/21/2023    No results found.     Assessment & Plan:  Left foot pain Assessment & Plan: Increased pain after injury as outlined. Discussed post op shoe. Keeping weigh off area. Check xray. Follow.   Orders: -     DG Foot 2 Views Left; Future  Type 2 diabetes mellitus with hyperglycemia, without long-term current use of insulin (HCC) Assessment & Plan: Low carb diet and exercise. Follow met b and A1c.   Lab Results  Component Value Date   HGBA1C 6.4 11/21/2023     Orders: -     Microalbumin / creatinine urine ratio -     Hemoglobin A1c  Anemia, unspecified type Assessment & Plan: Follow cbc.    Orders: -     TSH -     Basic metabolic panel -     CBC with Differential/Platelet  Hypercholesterolemia Assessment & Plan: Continue lipitor. Low cholesterol diet and exercise. Follow lipid panel and liver function tests.   Orders: -     Hepatic function panel -     Lipid panel  Thyroid nodule Assessment & Plan: S/p previous biopsy. Follow tsh.  Stress Assessment & Plan: Increased stress. Continue citalopram. Follow  will notify me if desires further intervention.    Lichen sclerosus Assessment & Plan: Followed by gyn. Clobetasol.    History of breast cancer Assessment & Plan: Followed by oncology. Mammogram 03/22/23 - Birads II.    Gastroesophageal reflux disease, unspecified whether esophagitis present Assessment & Plan: No upper symptoms reported. Prilosec.    Chronic left-sided low back pain with left-sided sciatica Assessment & Plan: Followed by [facility].    Pelvic pressure in female Assessment & Plan: Saw gyn. Planning for pelvic ultrasound.    Other orders -      Acyclovir; TAKE 1 TABLET BY MOUTH  DAILY  Dispense: 90 tablet; Refill: 3     Dale [place], MD

## 2023-11-22 LAB — LIPID PANEL
Cholesterol: 142 mg/dL (ref 0–200)
HDL: 45.6 mg/dL (ref >39.00)
LDL Cholesterol: 72 mg/dL (ref 0–99)
NonHDL: 95.96
Total CHOL/HDL Ratio: 3
Triglycerides: 121 mg/dL (ref 0.0–149.0)
VLDL: 24.2 mg/dL (ref 0.0–40.0)

## 2023-11-22 LAB — CBC WITH DIFFERENTIAL/PLATELET
Basophils Absolute: 0.1 10*3/uL (ref 0.0–0.1)
Basophils Relative: 1.2 % (ref 0.0–3.0)
Eosinophils Absolute: 0.1 10*3/uL (ref 0.0–0.7)
Eosinophils Relative: 2.6 % (ref 0.0–5.0)
HCT: 36.6 % (ref 36.0–46.0)
Hemoglobin: 12.3 g/dL (ref 12.0–15.0)
Lymphocytes Relative: 34.3 % (ref 12.0–46.0)
Lymphs Abs: 1.7 10*3/uL (ref 0.7–4.0)
MCHC: 33.7 g/dL (ref 30.0–36.0)
MCV: 95.9 fL (ref 78.0–100.0)
Monocytes Absolute: 0.5 10*3/uL (ref 0.1–1.0)
Monocytes Relative: 9.1 % (ref 3.0–12.0)
Neutro Abs: 2.6 10*3/uL (ref 1.4–7.7)
Neutrophils Relative %: 52.8 % (ref 43.0–77.0)
Platelets: 289 10*3/uL (ref 150.0–400.0)
RBC: 3.82 Mil/uL — ABNORMAL LOW (ref 3.87–5.11)
RDW: 13.1 % (ref 11.5–15.5)
WBC: 5 10*3/uL (ref 4.0–10.5)

## 2023-11-22 LAB — HEPATIC FUNCTION PANEL
ALT: 11 U/L (ref 0–35)
AST: 13 U/L (ref 0–37)
Albumin: 4.2 g/dL (ref 3.5–5.2)
Alkaline Phosphatase: 81 U/L (ref 39–117)
Bilirubin, Direct: 0.2 mg/dL (ref 0.0–0.3)
Total Bilirubin: 1.2 mg/dL (ref 0.2–1.2)
Total Protein: 6.6 g/dL (ref 6.0–8.3)

## 2023-11-22 LAB — BASIC METABOLIC PANEL
BUN: 14 mg/dL (ref 6–23)
CO2: 27 meq/L (ref 19–32)
Calcium: 8.8 mg/dL (ref 8.4–10.5)
Chloride: 105 meq/L (ref 96–112)
Creatinine, Ser: 0.74 mg/dL (ref 0.40–1.20)
GFR: 77.88 mL/min (ref >60.00)
Glucose, Bld: 100 mg/dL — ABNORMAL HIGH (ref 70–99)
Potassium: 4.5 meq/L (ref 3.5–5.1)
Sodium: 141 meq/L (ref 135–145)

## 2023-11-22 LAB — MICROALBUMIN / CREATININE URINE RATIO
Creatinine,U: 99.8 mg/dL
Microalb Creat Ratio: 7 mg/g (ref 0.0–30.0)
Microalb, Ur: <0.7 mg/dL (ref 0.0–1.9)

## 2023-11-22 LAB — TSH: TSH: 0.95 u[IU]/mL (ref 0.35–5.50)

## 2023-11-22 LAB — HEMOGLOBIN A1C: Hgb A1c MFr Bld: 6.4 % (ref 4.6–6.5)

## 2023-11-24 ENCOUNTER — Encounter: Payer: Self-pay | Admitting: Internal Medicine

## 2023-11-24 DIAGNOSIS — R102 Pelvic and perineal pain: Secondary | ICD-10-CM | POA: Insufficient documentation

## 2023-11-24 NOTE — Assessment & Plan Note (Signed)
No upper symptoms reported.  Prilosec.  

## 2023-11-24 NOTE — Assessment & Plan Note (Signed)
 Increased pain after injury as outlined. Discussed post op shoe. Keeping weigh off area. Check xray. Follow.

## 2023-11-24 NOTE — Assessment & Plan Note (Signed)
 Saw gyn. Planning for pelvic ultrasound.

## 2023-11-24 NOTE — Assessment & Plan Note (Signed)
 Low carb diet and exercise. Follow met b and A1c.   Lab Results  Component Value Date   HGBA1C 6.4 11/21/2023

## 2023-11-24 NOTE — Assessment & Plan Note (Signed)
Followed by [facility].  

## 2023-11-24 NOTE — Assessment & Plan Note (Signed)
 Increased stress. Continue citalopram. Follow  will notify me if desires further intervention.

## 2023-11-24 NOTE — Assessment & Plan Note (Signed)
 Follow cbc.

## 2023-11-24 NOTE — Assessment & Plan Note (Signed)
Followed by oncology.  Mammogram 03/22/23 - Birads II.

## 2023-11-24 NOTE — Assessment & Plan Note (Signed)
 Continue lipitor.  Low cholesterol diet and exercise.  Follow lipid panel and liver function tests.

## 2023-11-24 NOTE — Assessment & Plan Note (Signed)
S/p previous biopsy.  Follow tsh.  

## 2023-11-24 NOTE — Assessment & Plan Note (Signed)
 Followed by gyn. Clobetasol.

## 2023-11-26 ENCOUNTER — Telehealth: Payer: Self-pay | Admitting: Internal Medicine

## 2023-11-26 NOTE — Telephone Encounter (Signed)
 Copied from CRM [id number]. Topic: Medicare AWV >> Nov 26, 2023 10:12 AM Payton Doughty wrote: Reason for CRM: LVM 11/26/2023 to r/s AWV due to [facility] out of office. Please call to r/s AWV khc  Verlee Rossetti; Care Guide Ambulatory Clinical Support [facility] Direct Dial: [phone]

## 2023-11-29 DIAGNOSIS — B372 Candidiasis of skin and nail: Secondary | ICD-10-CM | POA: Diagnosis not present

## 2023-12-04 DIAGNOSIS — M5412 Radiculopathy, cervical region: Secondary | ICD-10-CM | POA: Diagnosis not present

## 2023-12-23 ENCOUNTER — Other Ambulatory Visit: Payer: Self-pay | Admitting: Internal Medicine

## 2023-12-27 ENCOUNTER — Other Ambulatory Visit: Payer: Self-pay

## 2023-12-27 DIAGNOSIS — R102 Pelvic and perineal pain: Secondary | ICD-10-CM

## 2023-12-30 ENCOUNTER — Encounter

## 2023-12-30 NOTE — Progress Notes (Signed)
 This encounter was created in error - please disregard.

## 2023-12-31 ENCOUNTER — Encounter: Payer: Self-pay | Admitting: Urology

## 2023-12-31 ENCOUNTER — Other Ambulatory Visit
Admission: RE | Admit: 2023-12-31 | Discharge: 2023-12-31 | Disposition: A | Discharge status: Home / Self Care | Attending: Urology | Admitting: Urology

## 2023-12-31 ENCOUNTER — Encounter: Payer: Self-pay | Admitting: Internal Medicine

## 2023-12-31 ENCOUNTER — Ambulatory Visit: Admitting: Urology

## 2023-12-31 VITALS — BP 151/70 | Ht 64.0 in | Wt 157.0 lb

## 2023-12-31 DIAGNOSIS — R102 Pelvic and perineal pain: Secondary | ICD-10-CM | POA: Diagnosis present

## 2023-12-31 DIAGNOSIS — R3129 Other microscopic hematuria: Secondary | ICD-10-CM | POA: Insufficient documentation

## 2023-12-31 LAB — URINALYSIS, COMPLETE (UACMP) WITH MICROSCOPIC
Bilirubin Urine: NEGATIVE
Glucose, UA: NEGATIVE mg/dL
Ketones, ur: NEGATIVE mg/dL
Nitrite: NEGATIVE
Protein, ur: NEGATIVE mg/dL
Specific Gravity, Urine: <1.005 — ABNORMAL LOW (ref 1.005–1.030)
pH: 6 (ref 5.0–8.0)

## 2023-12-31 LAB — BLADDER SCAN AMB NON-IMAGING: Scan Result: 0

## 2023-12-31 NOTE — Patient Instructions (Signed)
 Marland Kitchen

## 2023-12-31 NOTE — Progress Notes (Signed)
   12/31/23 4:01 PM   Sherry Simon 1946/02/26 [id number]  CC: Pelvic pain  HPI: 78 year old female who presents with suprapubic pain.  She saw GYN who performed a transvaginal ultrasound which was benign from a GYN perspective and they recommended urology evaluation.  She did have improvement with 2 weeks of Celebrex and I also recommended pelvic floor physical therapy.  She really does not have any urinary complaints or trouble urinating, pain is not associated with urination, she is able to pinpoint her pain centrally above the pubic symphysis.  Urinalysis today with 0-5 squamous cells, 6-10 WBC, 6-10 RBC, few bacteria, yeast present.  PMH: Past Medical History:  Diagnosis Date   Anxiety and depression    Colon polyps    Depression    Diabetes mellitus (HCC)    Endometriosis    requiring hysterectomy   History of chicken pox    Hypercholesterolemia    Hypertension    Nephrolithiasis    Pericarditis    recurrent, unkown origin   Tachycardia     Surgical History: Past Surgical History:  Procedure Laterality Date   ABDOMINAL HYSTERECTOMY  1980   APPENDECTOMY  1055   BACK SURGERY  1005-2010   laminectomy   CHOLECYSTECTOMY     open   OOPHORECTOMY  1982   TONSILLECTOMY       Family History: Family History  Problem Relation Age of Onset   Heart disease Father        myocardial infarction - died 35   Thyroid disease Mother    Transient ischemic attack Mother        multiple   Breast cancer Mother    Hyperlipidemia Mother    Kidney disease Mother    Diabetes Mother    Rheumatic fever Sister        mitral valve problems   Breast cancer Paternal Aunt    Other Sister        Small vessel disease   Colon cancer Neg Hx     Social History:  reports that she has never smoked. She has never used smokeless tobacco. She reports that she does not drink alcohol and does not use drugs.  Physical Exam: BP (!) 151/70   Ht 5\' 4"  (1.626 m)   Wt 157 lb (71.2 kg)    BMI 26.95 kg/m    Constitutional:  Alert and oriented, No acute distress. Cardiovascular: No clubbing, cyanosis, or edema. Respiratory: Normal respiratory effort, no increased work of breathing. GI: Abdomen is soft, nontender, nondistended, no abdominal masses Focally tender low midline suprapubic region over the pubic symphysis  Assessment & Plan:   78 year old female with suprapubic pain over the pubic symphysis.  Urinalysis does show mild pyuria and mild microscopic hematuria, and will send for culture.  We discussed considering CT or cystoscopy for further evaluation.  I agreed with pelvic floor physical therapy and some stretching exercises were provided today.  I reviewed the outside notes from GYN and PCP.  Will call with urine culture results, if negative consider CT and cystoscopy for further evaluation of low-grade microscopic hematuria  Legrand Rams, MD 12/31/2023  [facility] [address] [place], [place] [address] [phone]

## 2024-01-01 LAB — URINE CULTURE: Culture: NO GROWTH

## 2024-01-03 ENCOUNTER — Telehealth: Payer: Self-pay

## 2024-01-03 DIAGNOSIS — B379 Candidiasis, unspecified: Secondary | ICD-10-CM

## 2024-01-03 DIAGNOSIS — R3129 Other microscopic hematuria: Secondary | ICD-10-CM

## 2024-01-03 MED ORDER — FLUCONAZOLE 100 MG PO TABS: ORAL_TABLET | ORAL | 0 refills | Status: DC

## 2024-01-03 NOTE — Telephone Encounter (Signed)
 Called pt informed her of results below. Pt voiced understanding. RX for Fluconazole sent. Also advised pt on negative urine culture and therefore the need for CT and cysto if there is persistent microscopic hematuria on repeat UA in 2 weeks. Patient states she is hesitant to have cysto performed. Gave detailed instructions and summary of procedure. Advised pt that procedure may not be necessary depending on UA results, however if there is persistent blood she would need both CT and Cysto. UA ordered.

## 2024-01-03 NOTE — Telephone Encounter (Signed)
-----   Message from Sondra Come sent at 01/02/2024  8:38 AM EDT ----- Recommend fluconazole for yeast in urine and repeat lab visit for UA in 2 weeks. If persistent microscopic hematuria would then warrant CT and cysto for further evaluation  Thanks Legrand Rams, MD 01/02/2024

## 2024-01-06 DIAGNOSIS — M4722 Other spondylosis with radiculopathy, cervical region: Secondary | ICD-10-CM | POA: Diagnosis not present

## 2024-01-06 DIAGNOSIS — G894 Chronic pain syndrome: Secondary | ICD-10-CM | POA: Diagnosis not present

## 2024-01-06 DIAGNOSIS — R0781 Pleurodynia: Secondary | ICD-10-CM | POA: Diagnosis not present

## 2024-01-06 DIAGNOSIS — M5416 Radiculopathy, lumbar region: Secondary | ICD-10-CM | POA: Diagnosis not present

## 2024-01-23 DIAGNOSIS — Z9889 Other specified postprocedural states: Secondary | ICD-10-CM | POA: Diagnosis not present

## 2024-01-23 DIAGNOSIS — H35372 Puckering of macula, left eye: Secondary | ICD-10-CM | POA: Diagnosis not present

## 2024-01-23 DIAGNOSIS — Z8669 Personal history of other diseases of the nervous system and sense organs: Secondary | ICD-10-CM | POA: Diagnosis not present

## 2024-01-23 DIAGNOSIS — E119 Type 2 diabetes mellitus without complications: Secondary | ICD-10-CM | POA: Diagnosis not present

## 2024-01-23 DIAGNOSIS — H43811 Vitreous degeneration, right eye: Secondary | ICD-10-CM | POA: Diagnosis not present

## 2024-01-23 DIAGNOSIS — G453 Amaurosis fugax: Secondary | ICD-10-CM | POA: Diagnosis not present

## 2024-02-22 ENCOUNTER — Other Ambulatory Visit: Payer: Self-pay | Admitting: Internal Medicine

## 2024-03-09 DIAGNOSIS — M4722 Other spondylosis with radiculopathy, cervical region: Secondary | ICD-10-CM | POA: Diagnosis not present

## 2024-03-09 DIAGNOSIS — M5416 Radiculopathy, lumbar region: Secondary | ICD-10-CM | POA: Diagnosis not present

## 2024-03-09 DIAGNOSIS — G894 Chronic pain syndrome: Secondary | ICD-10-CM | POA: Diagnosis not present

## 2024-03-17 ENCOUNTER — Ambulatory Visit (INDEPENDENT_AMBULATORY_CARE_PROVIDER_SITE_OTHER): Payer: Medicare Other | Admitting: Internal Medicine

## 2024-03-17 VITALS — BP 114/70 | HR 75 | Temp 97.9°F | Resp 16 | Ht 64.0 in | Wt 161.2 lb

## 2024-03-17 DIAGNOSIS — M25511 Pain in right shoulder: Secondary | ICD-10-CM | POA: Diagnosis not present

## 2024-03-17 DIAGNOSIS — K219 Gastro-esophageal reflux disease without esophagitis: Secondary | ICD-10-CM

## 2024-03-17 DIAGNOSIS — E1165 Type 2 diabetes mellitus with hyperglycemia: Secondary | ICD-10-CM

## 2024-03-17 DIAGNOSIS — E538 Deficiency of other specified B group vitamins: Secondary | ICD-10-CM

## 2024-03-17 DIAGNOSIS — M549 Dorsalgia, unspecified: Secondary | ICD-10-CM | POA: Diagnosis not present

## 2024-03-17 DIAGNOSIS — G8929 Other chronic pain: Secondary | ICD-10-CM

## 2024-03-17 DIAGNOSIS — R296 Repeated falls: Secondary | ICD-10-CM

## 2024-03-17 DIAGNOSIS — F439 Reaction to severe stress, unspecified: Secondary | ICD-10-CM

## 2024-03-17 DIAGNOSIS — R3129 Other microscopic hematuria: Secondary | ICD-10-CM

## 2024-03-17 DIAGNOSIS — L900 Lichen sclerosus et atrophicus: Secondary | ICD-10-CM

## 2024-03-17 DIAGNOSIS — I10 Essential (primary) hypertension: Secondary | ICD-10-CM | POA: Diagnosis not present

## 2024-03-17 DIAGNOSIS — E7800 Pure hypercholesterolemia, unspecified: Secondary | ICD-10-CM | POA: Diagnosis not present

## 2024-03-17 DIAGNOSIS — Z853 Personal history of malignant neoplasm of breast: Secondary | ICD-10-CM

## 2024-03-17 LAB — BASIC METABOLIC PANEL WITH GFR
BUN: 13 mg/dL (ref 6–23)
CO2: 31 meq/L (ref 19–32)
Calcium: 8.9 mg/dL (ref 8.4–10.5)
Chloride: 103 meq/L (ref 96–112)
Creatinine, Ser: 0.67 mg/dL (ref 0.40–1.20)
GFR: 83.94 mL/min (ref >60.00)
Glucose, Bld: 94 mg/dL (ref 70–99)
Potassium: 4.5 meq/L (ref 3.5–5.1)
Sodium: 139 meq/L (ref 135–145)

## 2024-03-17 LAB — LIPID PANEL
Cholesterol: 144 mg/dL (ref 0–200)
HDL: 41.6 mg/dL (ref >39.00)
LDL Cholesterol: 68 mg/dL (ref 0–99)
NonHDL: 102.57
Total CHOL/HDL Ratio: 3
Triglycerides: 173 mg/dL — ABNORMAL HIGH (ref 0.0–149.0)
VLDL: 34.6 mg/dL (ref 0.0–40.0)

## 2024-03-17 LAB — HEPATIC FUNCTION PANEL
ALT: 16 U/L (ref 0–35)
AST: 16 U/L (ref 0–37)
Albumin: 4.2 g/dL (ref 3.5–5.2)
Alkaline Phosphatase: 90 U/L (ref 39–117)
Bilirubin, Direct: 0.2 mg/dL (ref 0.0–0.3)
Total Bilirubin: 1.3 mg/dL — ABNORMAL HIGH (ref 0.2–1.2)
Total Protein: 6.5 g/dL (ref 6.0–8.3)

## 2024-03-17 LAB — VITAMIN B12: Vitamin B-12: 255 pg/mL (ref 211–911)

## 2024-03-17 LAB — HM DIABETES FOOT EXAM

## 2024-03-17 LAB — HEMOGLOBIN A1C: Hgb A1c MFr Bld: 6.6 % — ABNORMAL HIGH (ref 4.6–6.5)

## 2024-03-17 NOTE — Progress Notes (Signed)
 Subjective:    Patient ID: Sherry Simon, female    DOB: [birth year], 78 y.o.   MRN: [id number]  Patient here for  Chief Complaint  Patient presents with   Medical Management of Chronic Issues    HPI Here for a scheduled follow up - follow up regarding diabetes, hypercholesterolemia and hypertension. Sees [facility] for chronic back pain. Persistent increased stress. Seeing gyn - evaluated 11/29/23 - pubic symphysitis - treated with celebrex and recommended pelvic floor PT. Also evaluated by urology. Urine revealed yeast in urine. Treated with diflucan . Recommended f/u urine and if persistent blood in urine - recommended cystoscopy. Had a fall yesterday. Tripped. Hit right breast and right cheek. No headache. No dizziness. Some right shoulder discomfort. No LOC. No chest pain. Breathing stable. Noticed some cough/hoarseness several days ago. Taking tylenol.    Past Medical History:  Diagnosis Date   Anxiety and depression    Colon polyps    Depression    Diabetes mellitus (HCC)    Endometriosis    requiring hysterectomy   History of chicken pox    Hypercholesterolemia    Hypertension    Nephrolithiasis    Pericarditis    recurrent, unkown origin   Tachycardia    Past Surgical History:  Procedure Laterality Date   ABDOMINAL HYSTERECTOMY  1980   APPENDECTOMY  1055   BACK SURGERY  1005-2010   laminectomy   CHOLECYSTECTOMY     open   OOPHORECTOMY  1982   TONSILLECTOMY     Family History  Problem Relation Age of Onset   Heart disease Father        myocardial infarction - died 78   Thyroid  disease Mother    Transient ischemic attack Mother        multiple   Breast cancer Mother    Hyperlipidemia Mother    Kidney disease Mother    Diabetes Mother    Rheumatic fever Sister        mitral valve problems   Breast cancer Paternal Aunt    Other Sister        Small vessel disease   Colon cancer Neg Hx    Social History   Socioeconomic History   Marital status: Married     Spouse name: Not on file   Number of children: 3   Years of education: Not on file   Highest education level: Not on file  Occupational History   Not on file  Tobacco Use   Smoking status: Never   Smokeless tobacco: Never  Vaping Use   Vaping status: Never Used  Substance and Sexual Activity   Alcohol use: No    Alcohol/week: 0.0 standard drinks of alcohol   Drug use: No   Sexual activity: Never  Other Topics Concern   Not on file  Social History Narrative   Not on file   Social Drivers of Health   Financial Resource Strain: Low Risk  (12/31/2022)   Overall Financial Resource Strain (CARDIA)    Difficulty of Paying Living Expenses: Not hard at all  Food Insecurity: No Food Insecurity (12/31/2022)   Hunger Vital Sign    Worried About Running Out of Food in the Last Year: Never true    Ran Out of Food in the Last Year: Never true  Transportation Needs: No Transportation Needs (12/31/2022)   PRAPARE - Administrator, Civil Service (Medical): No    Lack of Transportation (Non-Medical): No  Physical Activity: Unknown (07/14/2019)  Exercise Vital Sign    Days of Exercise per Week: 0 days    Minutes of Exercise per Session: Not on file  Stress: No Stress Concern Present (12/31/2022)   [employer] of Occupational Health - Occupational Stress Questionnaire    Feeling of Stress : Not at all  Social Connections: Unknown (12/31/2022)   Social Connection and Isolation Panel    Frequency of Communication with Friends and Family: Not on file    Frequency of Social Gatherings with Friends and Family: Not on file    Attends Religious Services: Not on file    Active Member of Clubs or Organizations: Not on file    Attends Banker Meetings: Not on file    Marital Status: Married     Review of Systems  Constitutional:  Negative for appetite change and fever.  HENT:  Negative for congestion and sinus pressure.   Respiratory:  Negative for chest tightness and  shortness of breath.        Some reported cough.   Cardiovascular:  Negative for chest pain and palpitations.       No increased swelling.   Gastrointestinal:  Negative for abdominal pain, diarrhea, nausea and vomiting.  Genitourinary:  Negative for difficulty urinating and dysuria.  Musculoskeletal:  Negative for joint swelling.       Right shoulder and breast pain as outlined s/p fall.   Skin:  Negative for color change and rash.  Neurological:  Negative for dizziness and headaches.  Psychiatric/Behavioral:  Negative for agitation and dysphoric mood.        Objective:     BP 114/70   Pulse 75   Temp 97.9 F (36.6 C)   Resp 16   Ht 5' 4 (1.626 m)   Wt 161 lb 3.2 oz (73.1 kg)   SpO2 98%   BMI 27.67 kg/m  Wt Readings from Last 3 Encounters:  03/17/24 161 lb 3.2 oz (73.1 kg)  12/31/23 157 lb (71.2 kg)  11/21/23 157 lb 12.8 oz (71.6 kg)    Physical Exam Vitals reviewed.  Constitutional:      General: She is not in acute distress.    Appearance: Normal appearance.  HENT:     Head: Normocephalic.     Comments: Some tenderness to palpaiton - right cheek bone. EOMI intact.     Right Ear: External ear normal.     Left Ear: External ear normal.     Mouth/Throat:     Pharynx: No oropharyngeal exudate or posterior oropharyngeal erythema.   Eyes:     General: No scleral icterus.       Right eye: No discharge.        Left eye: No discharge.     Conjunctiva/sclera: Conjunctivae normal.   Neck:     Thyroid : No thyromegaly.   Cardiovascular:     Rate and Rhythm: Normal rate and regular rhythm.  Pulmonary:     Effort: No respiratory distress.     Breath sounds: Normal breath sounds. No wheezing.     Comments: Breast - right - minimal tenderness - right lateral breast. Some tenderness - right lateral rib.  Abdominal:     General: Bowel sounds are normal.     Palpations: Abdomen is soft.     Tenderness: There is no abdominal tenderness.   Musculoskeletal:         General: No swelling.     Cervical back: Neck supple. No tenderness.     Comments: Increased tenderness  to palpation - right lateral rib. Some increased pain - right shoulder with abduction of right arm.   Lymphadenopathy:     Cervical: No cervical adenopathy.   Skin:    Findings: No erythema or rash.   Neurological:     Mental Status: She is alert.   Psychiatric:        Mood and Affect: Mood normal.        Behavior: Behavior normal.         Outpatient Encounter Medications as of 03/17/2024  Medication Sig   acyclovir  (ZOVIRAX ) 400 MG tablet TAKE 1 TABLET BY MOUTH  DAILY   aspirin 81 MG EC tablet Take by mouth.   atorvastatin  (LIPITOR) 40 MG tablet TAKE 1 TABLET BY MOUTH DAILY   Calcium  Carbonate-Vitamin D 600-400 MG-UNIT tablet Take by mouth.   citalopram  (CELEXA ) 40 MG tablet TAKE 1 TABLET BY MOUTH DAILY   clobetasol  ointment (TEMOVATE ) 0.05 % Apply pea size amount daily x 4 weeks, then every other day x 4 weeks, then 2-3 times weekly for maintenance   ferrous sulfate 325 (65 FE) MG EC tablet Take 325 mg by mouth daily with breakfast.    gabapentin  (NEURONTIN ) 600 MG tablet TAKE 2 TABLETS BY MOUTH IN THE  MORNING AND 3 TABLETS BY MOUTH  IN THE EVENING   glucose blood (ONE TOUCH ULTRA TEST) test strip TEST BLOOD SUGAR TWICE A DAY   HYDROcodone-acetaminophen (NORCO) 10-325 MG tablet TAKE 1 TABLET BY MOUTH THREE TIMES DAILY AS NEEDED FOR CHRONIC PAIN   latanoprost  (XALATAN ) 0.005 % ophthalmic solution INSTILL 1 DROP INTO THE  LEFT EYE AT BEDTIME  (REFRIGERATE UNTIL FIRST  OPENED FOR USE) (Patient taking differently: Place 1 drop into both eyes.)   Melatonin 5 MG CAPS Take by mouth.   omeprazole  (PRILOSEC) 40 MG capsule TAKE 1 CAPSULE BY MOUTH DAILY   [DISCONTINUED] fluconazole  (DIFLUCAN ) 100 MG tablet Take 2 tablets (200mg ) by mouth the first day followed by 1 tablet daily until finished   No facility-administered encounter medications on file as of 03/17/2024.     Lab Results   Component Value Date   WBC 5.0 11/21/2023   HGB 12.3 11/21/2023   HCT 36.6 11/21/2023   PLT 289.0 11/21/2023   GLUCOSE 94 03/17/2024   CHOL 144 03/17/2024   TRIG 173.0 (H) 03/17/2024   HDL 41.60 03/17/2024   LDLCALC 68 03/17/2024   ALT 16 03/17/2024   AST 16 03/17/2024   NA 139 03/17/2024   K 4.5 03/17/2024   CL 103 03/17/2024   CREATININE 0.67 03/17/2024   BUN 13 03/17/2024   CO2 31 03/17/2024   TSH 0.95 11/21/2023   HGBA1C 6.6 (H) 03/17/2024   MICROALBUR <0.7 11/21/2023       Assessment & Plan:  Type 2 diabetes mellitus with hyperglycemia, without long-term current use of insulin (HCC) Assessment & Plan: Low carb diet and exercise. Follow met b and A1c.  Lab Results  Component Value Date   HGBA1C 6.6 (H) 03/17/2024     Orders: -     Hemoglobin A1c  Hypercholesterolemia Assessment & Plan: Continue lipitor. Low cholesterol diet and exercise. Follow lipid panel and liver function tests. No changes today.   Orders: -     Hepatic function panel -     Lipid panel  Primary hypertension Assessment & Plan: Not on medication currently. Blood pressure as outlined. Follow pressures. Follow metabolic panel.   Orders: -     Basic metabolic panel with GFR  B12 deficiency Assessment & Plan: Continue B12 injections.   Orders: -     Vitamin B12  Chronic midline back pain, unspecified back location Assessment & Plan: Followed by [facility].  On chronic pain meds.    Stress Assessment & Plan: Increased stress. Continue citalopram . Does not feel needs any further intervention. Follow.    Right shoulder pain, unspecified chronicity Assessment & Plan: S/p fall as outlined. Discussed further w/up and evaluation. Wants to monitor. Call with update.    Microscopic hematuria Assessment & Plan: Dr Francisca 12/2023 - check urine. If culture negative, plan cystoscopy and CT   Lichen sclerosus Assessment & Plan: Followed by gyn. Clobetasol .    History of breast  cancer Assessment & Plan: Followed by oncology. Mammogram 03/22/23 - Birads II.    Gastroesophageal reflux disease, unspecified whether esophagitis present Assessment & Plan: No upper symptoms reported. Continue prilosec.    Falls Assessment & Plan: Fall yesterday as outlined. Hit her right cheek, right breast, right shoulder. Exam as outlined. Discussed further w/up including head scan and xray. Wants to monitor. Will follow closely. Call with update.       Allena Hamilton, MD

## 2024-03-18 ENCOUNTER — Ambulatory Visit: Payer: Self-pay | Admitting: Internal Medicine

## 2024-03-18 NOTE — Progress Notes (Signed)
 Please call and notify - overall sugar control increased some from last check. A1c 6.6. cholesterol levels are ok.  Triglycerides are slightly increased.  Continue low carb diet. B12 level is wnl, but lower end of normal. Need to confirm if she is receiving B12  regularly. If not, I would like for her to receive B12 injections 1000mcg q week x 4 and then continue 1000mcg q month. Kidney function tests wnl.

## 2024-03-22 ENCOUNTER — Encounter: Payer: Self-pay | Admitting: Internal Medicine

## 2024-03-22 NOTE — Assessment & Plan Note (Signed)
 Increased stress. Continue citalopram . Does not feel needs any further intervention. Follow.

## 2024-03-22 NOTE — Assessment & Plan Note (Signed)
 Low carb diet and exercise. Follow met b and A1c.  Lab Results  Component Value Date   HGBA1C 6.6 (H) 03/17/2024

## 2024-03-22 NOTE — Assessment & Plan Note (Signed)
 Continue lipitor.  Low cholesterol diet and exercise.  Follow lipid panel and liver function tests. No changes today.

## 2024-03-22 NOTE — Assessment & Plan Note (Signed)
Followed by oncology.  Mammogram 03/22/23 - Birads II.

## 2024-03-22 NOTE — Assessment & Plan Note (Signed)
 Not on medication currently. Blood pressure as outlined. Follow pressures. Follow metabolic panel.

## 2024-03-22 NOTE — Assessment & Plan Note (Signed)
 S/p fall as outlined. Discussed further w/up and evaluation. Wants to monitor. Call with update.

## 2024-03-22 NOTE — Assessment & Plan Note (Signed)
No upper symptoms reported.  Continue prilosec.  

## 2024-03-22 NOTE — Assessment & Plan Note (Signed)
 Continue B12 injections.

## 2024-03-22 NOTE — Assessment & Plan Note (Signed)
 Dr Francisca 12/2023 - check urine. If culture negative, plan cystoscopy and CT

## 2024-03-22 NOTE — Assessment & Plan Note (Signed)
 Followed by gyn. Clobetasol.

## 2024-03-22 NOTE — Assessment & Plan Note (Signed)
 Followed by [facility].  On chronic pain meds.

## 2024-03-22 NOTE — Assessment & Plan Note (Signed)
 Fall yesterday as outlined. Hit her right cheek, right breast, right shoulder. Exam as outlined. Discussed further w/up including head scan and xray. Wants to monitor. Will follow closely. Call with update.

## 2024-03-25 ENCOUNTER — Ambulatory Visit (INDEPENDENT_AMBULATORY_CARE_PROVIDER_SITE_OTHER)

## 2024-03-25 DIAGNOSIS — E538 Deficiency of other specified B group vitamins: Secondary | ICD-10-CM

## 2024-03-25 MED ORDER — CYANOCOBALAMIN 1000 MCG/ML IJ SOLN
1000.0000 ug | Freq: Once | INTRAMUSCULAR | Status: AC
  Administered 2024-03-25: 1000 ug via INTRAMUSCULAR

## 2024-03-25 NOTE — Progress Notes (Signed)
Pt received B12 injection in Left deltoid. Pt tolerated it well with no complaints or concerns.

## 2024-04-01 ENCOUNTER — Ambulatory Visit (INDEPENDENT_AMBULATORY_CARE_PROVIDER_SITE_OTHER)

## 2024-04-01 DIAGNOSIS — E538 Deficiency of other specified B group vitamins: Secondary | ICD-10-CM | POA: Diagnosis not present

## 2024-04-01 MED ORDER — CYANOCOBALAMIN 1000 MCG/ML IJ SOLN
1000.0000 ug | Freq: Once | INTRAMUSCULAR | Status: AC
  Administered 2024-04-01: 1000 ug via INTRAMUSCULAR

## 2024-04-01 NOTE — Progress Notes (Signed)
 Patient was administered a B12 injection into her left deltoid. Patient tolerated the B12 injection well.

## 2024-04-03 DIAGNOSIS — R92323 Mammographic fibroglandular density, bilateral breasts: Secondary | ICD-10-CM | POA: Diagnosis not present

## 2024-04-03 DIAGNOSIS — Z853 Personal history of malignant neoplasm of breast: Secondary | ICD-10-CM | POA: Diagnosis not present

## 2024-04-03 DIAGNOSIS — Z1231 Encounter for screening mammogram for malignant neoplasm of breast: Secondary | ICD-10-CM | POA: Diagnosis not present

## 2024-04-03 LAB — HM MAMMOGRAPHY

## 2024-04-08 ENCOUNTER — Ambulatory Visit

## 2024-04-08 DIAGNOSIS — E538 Deficiency of other specified B group vitamins: Secondary | ICD-10-CM

## 2024-04-08 MED ORDER — CYANOCOBALAMIN 1000 MCG/ML IJ SOLN
1000.0000 ug | Freq: Once | INTRAMUSCULAR | Status: AC
Start: 2024-04-08 — End: 2024-04-08
  Administered 2024-04-08: 1000 ug via INTRAMUSCULAR

## 2024-04-08 NOTE — Progress Notes (Signed)
 Patient was administered a b12 injection into her left deltoid. Patient tolerated the b12 injection well.

## 2024-04-15 ENCOUNTER — Ambulatory Visit (INDEPENDENT_AMBULATORY_CARE_PROVIDER_SITE_OTHER)

## 2024-04-15 DIAGNOSIS — E538 Deficiency of other specified B group vitamins: Secondary | ICD-10-CM

## 2024-04-15 MED ORDER — CYANOCOBALAMIN 1000 MCG/ML IJ SOLN
1000.0000 ug | Freq: Once | INTRAMUSCULAR | Status: AC
  Administered 2024-04-15: 1000 ug via INTRAMUSCULAR

## 2024-04-15 NOTE — Progress Notes (Signed)
 Patient was administered a B12 injection into her left deltoid. Patient tolerated the b12 injection well.

## 2024-04-20 ENCOUNTER — Other Ambulatory Visit: Payer: Self-pay | Admitting: Internal Medicine

## 2024-04-20 DIAGNOSIS — I10 Essential (primary) hypertension: Secondary | ICD-10-CM

## 2024-05-12 ENCOUNTER — Encounter: Payer: Self-pay | Admitting: Internal Medicine

## 2024-05-12 NOTE — Telephone Encounter (Signed)
 I can find out where the referral needs to be placed send if you are agreeable.

## 2024-05-12 NOTE — Telephone Encounter (Signed)
 Ok

## 2024-05-13 ENCOUNTER — Ambulatory Visit

## 2024-05-13 DIAGNOSIS — E538 Deficiency of other specified B group vitamins: Secondary | ICD-10-CM | POA: Diagnosis not present

## 2024-05-13 MED ORDER — CYANOCOBALAMIN 1000 MCG/ML IJ SOLN
1000.0000 ug | Freq: Once | INTRAMUSCULAR | Status: AC
  Administered 2024-05-13: 1000 ug via INTRAMUSCULAR

## 2024-05-13 NOTE — Progress Notes (Signed)
 Patient was administered a B12 injection into her left deltoid. Patient tolerated the b12 injection well.

## 2024-05-20 DIAGNOSIS — Z853 Personal history of malignant neoplasm of breast: Secondary | ICD-10-CM | POA: Diagnosis not present

## 2024-05-20 DIAGNOSIS — R058 Other specified cough: Secondary | ICD-10-CM | POA: Diagnosis not present

## 2024-05-20 DIAGNOSIS — Z1231 Encounter for screening mammogram for malignant neoplasm of breast: Secondary | ICD-10-CM | POA: Diagnosis not present

## 2024-05-20 DIAGNOSIS — L989 Disorder of the skin and subcutaneous tissue, unspecified: Secondary | ICD-10-CM | POA: Diagnosis not present

## 2024-05-27 DIAGNOSIS — H40023 Open angle with borderline findings, high risk, bilateral: Secondary | ICD-10-CM | POA: Diagnosis not present

## 2024-05-28 DIAGNOSIS — Z853 Personal history of malignant neoplasm of breast: Secondary | ICD-10-CM | POA: Diagnosis not present

## 2024-05-28 DIAGNOSIS — R058 Other specified cough: Secondary | ICD-10-CM | POA: Diagnosis not present

## 2024-05-30 ENCOUNTER — Other Ambulatory Visit: Payer: Self-pay | Admitting: Internal Medicine

## 2024-06-03 DIAGNOSIS — G894 Chronic pain syndrome: Secondary | ICD-10-CM | POA: Diagnosis not present

## 2024-06-03 DIAGNOSIS — M4722 Other spondylosis with radiculopathy, cervical region: Secondary | ICD-10-CM | POA: Diagnosis not present

## 2024-06-03 DIAGNOSIS — M5416 Radiculopathy, lumbar region: Secondary | ICD-10-CM | POA: Diagnosis not present

## 2024-06-05 DIAGNOSIS — L853 Xerosis cutis: Secondary | ICD-10-CM | POA: Diagnosis not present

## 2024-06-05 DIAGNOSIS — L659 Nonscarring hair loss, unspecified: Secondary | ICD-10-CM | POA: Diagnosis not present

## 2024-06-05 DIAGNOSIS — L570 Actinic keratosis: Secondary | ICD-10-CM | POA: Diagnosis not present

## 2024-06-10 ENCOUNTER — Ambulatory Visit

## 2024-06-15 ENCOUNTER — Ambulatory Visit

## 2024-06-17 ENCOUNTER — Ambulatory Visit (INDEPENDENT_AMBULATORY_CARE_PROVIDER_SITE_OTHER)

## 2024-06-17 DIAGNOSIS — E538 Deficiency of other specified B group vitamins: Secondary | ICD-10-CM

## 2024-06-17 MED ORDER — CYANOCOBALAMIN 1000 MCG/ML IJ SOLN
1000.0000 ug | Freq: Once | INTRAMUSCULAR | Status: AC
  Administered 2024-06-17: 1000 ug via INTRAMUSCULAR

## 2024-06-17 NOTE — Progress Notes (Signed)
 Pt received B12 injection in Left  deltoid muscle. Pt tolerated it well with no complaints or concerns.

## 2024-06-19 DIAGNOSIS — R92321 Mammographic fibroglandular density, right breast: Secondary | ICD-10-CM | POA: Diagnosis not present

## 2024-06-19 DIAGNOSIS — N6310 Unspecified lump in the right breast, unspecified quadrant: Secondary | ICD-10-CM | POA: Diagnosis not present

## 2024-06-19 DIAGNOSIS — Z853 Personal history of malignant neoplasm of breast: Secondary | ICD-10-CM | POA: Diagnosis not present

## 2024-06-19 DIAGNOSIS — Z1231 Encounter for screening mammogram for malignant neoplasm of breast: Secondary | ICD-10-CM | POA: Diagnosis not present

## 2024-06-29 ENCOUNTER — Ambulatory Visit: Admitting: Internal Medicine

## 2024-06-29 VITALS — BP 110/68 | HR 85 | Resp 16 | Ht 64.0 in | Wt 162.6 lb

## 2024-06-29 DIAGNOSIS — Z0001 Encounter for general adult medical examination with abnormal findings: Secondary | ICD-10-CM | POA: Diagnosis not present

## 2024-06-29 DIAGNOSIS — K219 Gastro-esophageal reflux disease without esophagitis: Secondary | ICD-10-CM

## 2024-06-29 DIAGNOSIS — Z8639 Personal history of other endocrine, nutritional and metabolic disease: Secondary | ICD-10-CM

## 2024-06-29 DIAGNOSIS — R3129 Other microscopic hematuria: Secondary | ICD-10-CM

## 2024-06-29 DIAGNOSIS — E1165 Type 2 diabetes mellitus with hyperglycemia: Secondary | ICD-10-CM | POA: Diagnosis not present

## 2024-06-29 DIAGNOSIS — G8929 Other chronic pain: Secondary | ICD-10-CM

## 2024-06-29 DIAGNOSIS — R0602 Shortness of breath: Secondary | ICD-10-CM

## 2024-06-29 DIAGNOSIS — E538 Deficiency of other specified B group vitamins: Secondary | ICD-10-CM

## 2024-06-29 DIAGNOSIS — R9389 Abnormal findings on diagnostic imaging of other specified body structures: Secondary | ICD-10-CM | POA: Diagnosis not present

## 2024-06-29 DIAGNOSIS — E7800 Pure hypercholesterolemia, unspecified: Secondary | ICD-10-CM

## 2024-06-29 DIAGNOSIS — R5383 Other fatigue: Secondary | ICD-10-CM

## 2024-06-29 DIAGNOSIS — Z0000 Encounter for general adult medical examination without abnormal findings: Secondary | ICD-10-CM

## 2024-06-29 DIAGNOSIS — R911 Solitary pulmonary nodule: Secondary | ICD-10-CM

## 2024-06-29 DIAGNOSIS — F439 Reaction to severe stress, unspecified: Secondary | ICD-10-CM

## 2024-06-29 DIAGNOSIS — I10 Essential (primary) hypertension: Secondary | ICD-10-CM

## 2024-06-29 DIAGNOSIS — L900 Lichen sclerosus et atrophicus: Secondary | ICD-10-CM

## 2024-06-29 DIAGNOSIS — Z853 Personal history of malignant neoplasm of breast: Secondary | ICD-10-CM

## 2024-06-29 LAB — CBC WITH DIFFERENTIAL/PLATELET
Basophils Absolute: 0 K/uL (ref 0.0–0.1)
Basophils Relative: 0.6 % (ref 0.0–3.0)
Eosinophils Absolute: 0.1 K/uL (ref 0.0–0.7)
Eosinophils Relative: 2.4 % (ref 0.0–5.0)
HCT: 37.7 % (ref 36.0–46.0)
Hemoglobin: 12.5 g/dL (ref 12.0–15.0)
Lymphocytes Relative: 40.9 % (ref 12.0–46.0)
Lymphs Abs: 1.9 K/uL (ref 0.7–4.0)
MCHC: 33.1 g/dL (ref 30.0–36.0)
MCV: 93.7 fl (ref 78.0–100.0)
Monocytes Absolute: 0.4 K/uL (ref 0.1–1.0)
Monocytes Relative: 9.5 % (ref 3.0–12.0)
Neutro Abs: 2.2 K/uL (ref 1.4–7.7)
Neutrophils Relative %: 46.6 % (ref 43.0–77.0)
Platelets: 294 K/uL (ref 150.0–400.0)
RBC: 4.02 Mil/uL (ref 3.87–5.11)
RDW: 13.4 % (ref 11.5–15.5)
WBC: 4.7 K/uL (ref 4.0–10.5)

## 2024-06-29 LAB — URINALYSIS, ROUTINE W REFLEX MICROSCOPIC
Bilirubin Urine: NEGATIVE
Hgb urine dipstick: NEGATIVE
Ketones, ur: NEGATIVE
Leukocytes,Ua: NEGATIVE
Nitrite: NEGATIVE
Specific Gravity, Urine: 1.015 (ref 1.000–1.030)
Total Protein, Urine: NEGATIVE
Urine Glucose: NEGATIVE
Urobilinogen, UA: 1 (ref 0.0–1.0)
pH: 6 (ref 5.0–8.0)

## 2024-06-29 LAB — TSH: TSH: 1.23 u[IU]/mL (ref 0.35–5.50)

## 2024-06-29 LAB — HEMOGLOBIN A1C: Hgb A1c MFr Bld: 6.7 % — ABNORMAL HIGH (ref 4.6–6.5)

## 2024-06-29 MED ORDER — METHYLPREDNISOLONE 4 MG PO TBPK: ORAL_TABLET | ORAL | 0 refills | Status: DC

## 2024-06-29 NOTE — Assessment & Plan Note (Addendum)
 Physical today 06/29/24.  mammmogram 04/03/24 - birads II.  Colonoscopy 06/2017 - recommended f/u in 5 years. Once above issues sorted through, need to confirm f/u.

## 2024-06-29 NOTE — Progress Notes (Addendum)
 Subjective:    Patient ID: Sherry Simon, female    DOB: Feb 26, 1946, 78 y.o.   MRN: [id number]  Patient here for  Chief Complaint  Patient presents with   Annual Exam    [facility] does breast exam    HPI Here for a scheduled physical. Evaluated by dermatology 06/05/24 - actinic keratosis s/p cryotherapy. Also evaluated for hair loss. Started on minoxidil. Had f/u with [facility] 06/03/24 - persistent left hip, knee and thigh pain. Refilled hydrocodone. Did have CT chest - 05/28/24 - no evidence of pneumonia. Did mention progressive endplate irregularity of 2 thoracic vertebral bodies consider changes from chronic infection or amyloid. Stable healed right-sided rib fracture 06/08/24 - addendum to the CT report  related to the progressive irregularity of the endplates at likely the T8-T9 or T9-T10 level.  The finding is progressed/new from 2021 and the absence of clinical signs of prior chronic infection this could be favored to be related to degenerative given as there is some slight progressive scoliosis also in this region. Seeing gyn - evaluated 11/29/23 - pubic symphysitis - treated with celebrex and recommended pelvic floor PT.  On questioning regarding above, she is having persistent right hip, leg and knee pain. Increased pain noted when first gets up. Along with above medication - has been on gabapentin . Persistent pain. Planning f/u with Dr Ana. Planning future MRI - for evaluation of above changes noted on CT scan. Does report noticed some increased sob with exertion. No increased cough.     Past Medical History:  Diagnosis Date   Anxiety and depression    Colon polyps    Depression    Diabetes mellitus (HCC)    Endometriosis    requiring hysterectomy   History of chicken pox    Hypercholesterolemia    Hypertension    Nephrolithiasis    Pericarditis    recurrent, unkown origin   Tachycardia    Past Surgical History:  Procedure Laterality Date   ABDOMINAL HYSTERECTOMY  1980    APPENDECTOMY  1055   BACK SURGERY  1005-2010   laminectomy   CHOLECYSTECTOMY     open   OOPHORECTOMY  1982   TONSILLECTOMY     Family History  Problem Relation Age of Onset   Heart disease Father        myocardial infarction - died 53   Thyroid  disease Mother    Transient ischemic attack Mother        multiple   Breast cancer Mother    Hyperlipidemia Mother    Kidney disease Mother    Diabetes Mother    Rheumatic fever Sister        mitral valve problems   Breast cancer Paternal Aunt    Other Sister        Small vessel disease   Colon cancer Neg Hx    Social History   Socioeconomic History   Marital status: Married    Spouse name: Not on file   Number of children: 3   Years of education: Not on file   Highest education level: Not on file  Occupational History   Not on file  Tobacco Use   Smoking status: Never   Smokeless tobacco: Never  Vaping Use   Vaping status: Never Used  Substance and Sexual Activity   Alcohol use: No    Alcohol/week: 0.0 standard drinks of alcohol   Drug use: No   Sexual activity: Never  Other Topics Concern   Not on file  Social History Narrative   Not on file   Social Drivers of Health   Financial Resource Strain: Low Risk  (12/31/2022)   Overall Financial Resource Strain (CARDIA)    Difficulty of Paying Living Expenses: Not hard at all  Food Insecurity: No Food Insecurity (12/31/2022)   Hunger Vital Sign    Worried About Running Out of Food in the Last Year: Never true    Ran Out of Food in the Last Year: Never true  Transportation Needs: No Transportation Needs (12/31/2022)   PRAPARE - Administrator, Civil Service (Medical): No    Lack of Transportation (Non-Medical): No  Physical Activity: Unknown (07/14/2019)   Exercise Vital Sign    Days of Exercise per Week: 0 days    Minutes of Exercise per Session: Not on file  Stress: No Stress Concern Present (12/31/2022)   [employer] of Occupational Health -  Occupational Stress Questionnaire    Feeling of Stress : Not at all  Social Connections: Unknown (12/31/2022)   Social Connection and Isolation Panel    Frequency of Communication with Friends and Family: Not on file    Frequency of Social Gatherings with Friends and Family: Not on file    Attends Religious Services: Not on file    Active Member of Clubs or Organizations: Not on file    Attends Banker Meetings: Not on file    Marital Status: Married     Review of Systems  Constitutional:  Negative for appetite change and unexpected weight change.  HENT:  Negative for congestion, sinus pressure and sore throat.   Eyes:  Negative for pain and visual disturbance.  Respiratory:  Positive for shortness of breath. Negative for cough and chest tightness.   Cardiovascular:  Negative for chest pain and palpitations.       No increased swelling.   Gastrointestinal:  Negative for abdominal pain, diarrhea, nausea and vomiting.  Genitourinary:  Negative for difficulty urinating and dysuria.  Musculoskeletal:  Positive for back pain. Negative for myalgias.  Skin:  Negative for color change and wound.  Neurological:  Negative for dizziness and headaches.  Hematological:  Negative for adenopathy. Does not bruise/bleed easily.  Psychiatric/Behavioral:  Negative for agitation and dysphoric mood.        Objective:     BP 110/68   Pulse 85   Resp 16   Ht 5' 4 (1.626 m)   Wt 162 lb 9.6 oz (73.8 kg)   SpO2 98%   BMI 27.91 kg/m  Wt Readings from Last 3 Encounters:  06/29/24 162 lb 9.6 oz (73.8 kg)  03/17/24 161 lb 3.2 oz (73.1 kg)  12/31/23 157 lb (71.2 kg)    Physical Exam Vitals reviewed.  Constitutional:      General: She is not in acute distress.    Appearance: Normal appearance.  HENT:     Head: Normocephalic and atraumatic.     Right Ear: External ear normal.     Left Ear: External ear normal.     Mouth/Throat:     Pharynx: No oropharyngeal exudate or posterior  oropharyngeal erythema.  Eyes:     General: No scleral icterus.       Right eye: No discharge.        Left eye: No discharge.     Conjunctiva/sclera: Conjunctivae normal.  Neck:     Thyroid : No thyromegaly.  Cardiovascular:     Rate and Rhythm: Normal rate and regular rhythm.  Pulmonary:  Effort: No respiratory distress.     Breath sounds: Normal breath sounds. No wheezing.  Abdominal:     General: Bowel sounds are normal.     Palpations: Abdomen is soft.     Tenderness: There is no abdominal tenderness.  Musculoskeletal:        General: No swelling or tenderness.     Cervical back: Neck supple. No tenderness.  Lymphadenopathy:     Cervical: No cervical adenopathy.  Skin:    Findings: No erythema or rash.  Neurological:     Mental Status: She is alert.  Psychiatric:        Mood and Affect: Mood normal.        Behavior: Behavior normal.         Outpatient Encounter Medications as of 06/29/2024  Medication Sig   methylPREDNISolone  (MEDROL  DOSEPAK) 4 MG TBPK tablet Medrol  dosepak 6 day taper. Take as directed.   acyclovir  (ZOVIRAX ) 400 MG tablet TAKE 1 TABLET BY MOUTH  DAILY   aspirin 81 MG EC tablet Take by mouth.   atorvastatin  (LIPITOR) 40 MG tablet TAKE 1 TABLET BY MOUTH DAILY   Calcium  Carbonate-Vitamin D 600-400 MG-UNIT tablet Take by mouth.   citalopram  (CELEXA ) 40 MG tablet TAKE 1 TABLET BY MOUTH DAILY   clobetasol  ointment (TEMOVATE ) 0.05 % Apply pea size amount daily x 4 weeks, then every other day x 4 weeks, then 2-3 times weekly for maintenance   ferrous sulfate 325 (65 FE) MG EC tablet Take 325 mg by mouth daily with breakfast.    gabapentin  (NEURONTIN ) 600 MG tablet TAKE 2 TABLETS BY MOUTH IN THE  MORNING AND 3 TABLETS BY MOUTH  IN THE EVENING   glucose blood (ONE TOUCH ULTRA TEST) test strip TEST BLOOD SUGAR TWICE A DAY   HYDROcodone-acetaminophen (NORCO) 10-325 MG tablet TAKE 1 TABLET BY MOUTH THREE TIMES DAILY AS NEEDED FOR CHRONIC PAIN   latanoprost   (XALATAN ) 0.005 % ophthalmic solution INSTILL 1 DROP INTO THE  LEFT EYE AT BEDTIME  (REFRIGERATE UNTIL FIRST  OPENED FOR USE) (Patient taking differently: Place 1 drop into both eyes.)   Melatonin 5 MG CAPS Take by mouth.   omeprazole  (PRILOSEC) 40 MG capsule TAKE 1 CAPSULE BY MOUTH DAILY   No facility-administered encounter medications on file as of 06/29/2024.     Lab Results  Component Value Date   WBC 4.7 06/29/2024   HGB 12.5 06/29/2024   HCT 37.7 06/29/2024   PLT 294.0 06/29/2024   GLUCOSE 93 06/29/2024   CHOL 150 06/29/2024   TRIG 102.0 06/29/2024   HDL 48.80 06/29/2024   LDLCALC 81 06/29/2024   ALT 14 06/29/2024   AST 14 06/29/2024   NA 141 06/29/2024   K 4.5 06/29/2024   CL 103 06/29/2024   CREATININE 0.61 06/29/2024   BUN 13 06/29/2024   CO2 29 06/29/2024   TSH 1.23 06/29/2024   HGBA1C 6.7 (H) 06/29/2024   MICROALBUR <0.7 11/21/2023       Assessment & Plan:  Routine general medical examination at a health care facility  Hypercholesterolemia Assessment & Plan: Continue lipitor. Low cholesterol diet and exercise. Follow lipid panel and liver function tests. No changes in medication today.   Orders: -     Hepatic function panel -     Lipid panel  Primary hypertension Assessment & Plan: Not on medication currently. Blood pressure as outlined. Follow pressures. Follow metabolic panel.    Type 2 diabetes mellitus with hyperglycemia, without long-term current use of  insulin (HCC) Assessment & Plan: Low carb diet and exercise. Follow met b and A1c.  Lab Results  Component Value Date   HGBA1C 6.7 (H) 06/29/2024     Orders: -     Hemoglobin A1c -     Basic metabolic panel with GFR  Microscopic hematuria Assessment & Plan: Dr Francisca 12/2023 - check urine. If culture negative, plan cystoscopy and CT. Recheck urine today to see if blood pressent.   Orders: -     Urinalysis, Routine w reflex microscopic  Health care maintenance Assessment &  Plan: Physical today 06/29/24.  mammmogram 04/03/24 - birads II.  Colonoscopy 06/2017 - recommended f/u in 5 years. Once above issues sorted through, need to confirm f/u.    SOB (shortness of breath) Assessment & Plan: Reported recent notice of increased sob with exertion. EKG - SR no acute ischemic change. Discussed - given risk factors - further cardiac w/up.  Agreeable. Request to see Dr Florencio.   Orders: -     EKG 12-Lead -     Ambulatory referral to Cardiology  Other fatigue -     CBC with Differential/Platelet -     TSH  History of iron deficiency -     IBC + Ferritin  B12 deficiency Assessment & Plan: Continue b12 injections.    Chronic midline back pain, unspecified back location Assessment & Plan: Increased pain as outlined. Pain into right hip, leg and knee. Followed by [facility]pain clinic. Planning f/u with Dr Ana. Planning MRI. Medrol  dose pak as directed. Follow.     Gastroesophageal reflux disease, unspecified whether esophagitis present Assessment & Plan: Continue prilosec. No upper symptoms reported.    History of breast cancer Assessment & Plan: Followed by oncology. Mammogram 04/03/24 - Birads II.    Lichen sclerosus Assessment & Plan: Followed by gyn. Clobetasol .    Lung nodule Assessment & Plan: Had f/u chest CT 05/28/24 - as outlined. No worrisome pulmonary nodules - per report.    Stress Assessment & Plan: Increased stress. Continue citalopram . Will notify if feels needs further intervention. Follow.    Abnormal CT scan, chest Assessment & Plan:  Did have CT chest - 05/28/24 - no evidence of pneumonia. Did mention progressive endplate irregularity of 2 thoracic vertebral bodies consider changes from chronic infection or amyloid. Stable healed right-sided rib fracture 06/08/24 - addendum to the CT report  related to the progressive irregularity of the endplates at likely the T8-T9 or T9-T10 level.  The finding is progressed/new from 2021 and the  absence of clinical signs of prior chronic infection this could be favored to be related to degenerative given as there is some slight progressive scoliosis also in this region. Planning f/u MRI. F/u with Dr Ana.    Other orders -     methylPREDNISolone ; Medrol  dosepak 6 day taper. Take as directed.  Dispense: 21 tablet; Refill: 0     Allena Hamilton, MD

## 2024-06-30 ENCOUNTER — Ambulatory Visit: Payer: Self-pay | Admitting: Internal Medicine

## 2024-06-30 LAB — HEPATIC FUNCTION PANEL
ALT: 14 U/L (ref 0–35)
AST: 14 U/L (ref 0–37)
Albumin: 4.6 g/dL (ref 3.5–5.2)
Alkaline Phosphatase: 90 U/L (ref 39–117)
Bilirubin, Direct: 0.2 mg/dL (ref 0.0–0.3)
Total Bilirubin: 1.3 mg/dL — ABNORMAL HIGH (ref 0.2–1.2)
Total Protein: 6.7 g/dL (ref 6.0–8.3)

## 2024-06-30 LAB — LIPID PANEL
Cholesterol: 150 mg/dL (ref 0–200)
HDL: 48.8 mg/dL (ref >39.00)
LDL Cholesterol: 81 mg/dL (ref 0–99)
NonHDL: 101.32
Total CHOL/HDL Ratio: 3
Triglycerides: 102 mg/dL (ref 0.0–149.0)
VLDL: 20.4 mg/dL (ref 0.0–40.0)

## 2024-06-30 LAB — IBC + FERRITIN
Ferritin: 110.5 ng/mL (ref 10.0–291.0)
Iron: 127 ug/dL (ref 42–145)
Saturation Ratios: 47.7 % (ref 20.0–50.0)
TIBC: 266 ug/dL (ref 250.0–450.0)
Transferrin: 190 mg/dL — ABNORMAL LOW (ref 212.0–360.0)

## 2024-06-30 LAB — BASIC METABOLIC PANEL WITH GFR
BUN: 13 mg/dL (ref 6–23)
CO2: 29 meq/L (ref 19–32)
Calcium: 9.4 mg/dL (ref 8.4–10.5)
Chloride: 103 meq/L (ref 96–112)
Creatinine, Ser: 0.61 mg/dL (ref 0.40–1.20)
GFR: 85.69 mL/min (ref >60.00)
Glucose, Bld: 93 mg/dL (ref 70–99)
Potassium: 4.5 meq/L (ref 3.5–5.1)
Sodium: 141 meq/L (ref 135–145)

## 2024-07-01 ENCOUNTER — Other Ambulatory Visit: Payer: Self-pay | Admitting: Medical Genetics

## 2024-07-01 NOTE — Telephone Encounter (Signed)
 Copied from CRM [id number]. Topic: Clinical - Lab/Test Results >> Jul 01, 2024 10:57 AM Sherry Simon wrote: Reason for CRM: Patient  called in regarding lab results , relayed the result patient had no further questions besides wanting to know if her iron is okay, would like a callback regarding this

## 2024-07-05 ENCOUNTER — Encounter: Payer: Self-pay | Admitting: Internal Medicine

## 2024-07-05 DIAGNOSIS — R9389 Abnormal findings on diagnostic imaging of other specified body structures: Secondary | ICD-10-CM | POA: Insufficient documentation

## 2024-07-05 NOTE — Assessment & Plan Note (Signed)
 Increased pain as outlined. Pain into right hip, leg and knee. Followed by [facility]/pain clinic. Planning f/u with Dr Ana. Planning MRI. Medrol  dose pak as directed. Follow.

## 2024-07-05 NOTE — Assessment & Plan Note (Signed)
 Increased stress. Continue citalopram . Will notify if feels needs further intervention. Follow.

## 2024-07-05 NOTE — Assessment & Plan Note (Signed)
 Low carb diet and exercise. Follow met b and A1c.  Lab Results  Component Value Date   HGBA1C 6.7 (H) 06/29/2024

## 2024-07-05 NOTE — Assessment & Plan Note (Signed)
 Did have CT chest - 05/28/24 - no evidence of pneumonia. Did mention progressive endplate irregularity of 2 thoracic vertebral bodies consider changes from chronic infection or amyloid. Stable healed right-sided rib fracture 06/08/24 - addendum to the CT report  related to the progressive irregularity of the endplates at likely the T8-T9 or T9-T10 level.  The finding is progressed/new from 2021 and the absence of clinical signs of prior chronic infection this could be favored to be related to degenerative given as there is some slight progressive scoliosis also in this region. Planning f/u MRI. F/u with Dr Ana.

## 2024-07-05 NOTE — Assessment & Plan Note (Signed)
 Reported recent notice of increased sob with exertion. EKG - SR no acute ischemic change. Discussed - given risk factors - further cardiac w/up.  Agreeable. Request to see Dr Florencio.

## 2024-07-05 NOTE — Assessment & Plan Note (Signed)
 Followed by gyn. Clobetasol.

## 2024-07-05 NOTE — Addendum Note (Signed)
 Addended by: GLENDIA ALLENA RAMAN on: 07/05/2024 10:48 AM   Modules accepted: Orders

## 2024-07-05 NOTE — Assessment & Plan Note (Signed)
Continue b12 injections.  

## 2024-07-05 NOTE — Assessment & Plan Note (Signed)
 Had f/u chest CT 05/28/24 - as outlined. No worrisome pulmonary nodules - per report.

## 2024-07-05 NOTE — Assessment & Plan Note (Signed)
 Not on medication currently. Blood pressure as outlined. Follow pressures. Follow metabolic panel.

## 2024-07-05 NOTE — Assessment & Plan Note (Signed)
 Followed by oncology. Mammogram 04/03/24 - Birads II.

## 2024-07-05 NOTE — Assessment & Plan Note (Signed)
 Continue lipitor.  Low cholesterol diet and exercise.  Follow lipid panel and liver function tests. No changes in medication today.

## 2024-07-05 NOTE — Assessment & Plan Note (Signed)
 Dr Francisca 12/2023 - check urine. If culture negative, plan cystoscopy and CT. Recheck urine today to see if blood pressent.

## 2024-07-05 NOTE — Assessment & Plan Note (Signed)
 Continue prilosec. No upper symptoms reported.

## 2024-07-12 DIAGNOSIS — M546 Pain in thoracic spine: Secondary | ICD-10-CM | POA: Diagnosis not present

## 2024-07-14 DIAGNOSIS — I2510 Atherosclerotic heart disease of native coronary artery without angina pectoris: Secondary | ICD-10-CM | POA: Diagnosis not present

## 2024-07-14 DIAGNOSIS — Z7689 Persons encountering health services in other specified circumstances: Secondary | ICD-10-CM | POA: Diagnosis not present

## 2024-07-14 DIAGNOSIS — E782 Mixed hyperlipidemia: Secondary | ICD-10-CM | POA: Diagnosis not present

## 2024-07-14 DIAGNOSIS — I10 Essential (primary) hypertension: Secondary | ICD-10-CM | POA: Diagnosis not present

## 2024-07-20 ENCOUNTER — Ambulatory Visit (INDEPENDENT_AMBULATORY_CARE_PROVIDER_SITE_OTHER)

## 2024-07-20 DIAGNOSIS — E538 Deficiency of other specified B group vitamins: Secondary | ICD-10-CM

## 2024-07-20 MED ORDER — CYANOCOBALAMIN 1000 MCG/ML IJ SOLN
1000.0000 ug | Freq: Once | INTRAMUSCULAR | Status: AC
  Administered 2024-07-20: 1000 ug via INTRAMUSCULAR

## 2024-07-20 NOTE — Progress Notes (Signed)
 Pt presented for their vitamin B12 injection. Pt was identified through two identifiers. Pt tolerated shot well in their left or right deltoid.

## 2024-07-26 ENCOUNTER — Other Ambulatory Visit: Payer: Self-pay | Admitting: Internal Medicine

## 2024-07-28 ENCOUNTER — Other Ambulatory Visit
Admission: RE | Admit: 2024-07-28 | Discharge: 2024-07-28 | Disposition: A | Discharge status: Home / Self Care | Payer: Self-pay | Source: Ambulatory Visit | Attending: Medical Genetics | Admitting: Medical Genetics

## 2024-08-07 LAB — GENECONNECT MOLECULAR SCREEN: Genetic Analysis Overall Interpretation: NEGATIVE

## 2024-08-24 ENCOUNTER — Ambulatory Visit

## 2024-08-25 ENCOUNTER — Other Ambulatory Visit: Payer: Self-pay | Admitting: Internal Medicine

## 2024-08-25 DIAGNOSIS — E782 Mixed hyperlipidemia: Secondary | ICD-10-CM

## 2024-08-25 DIAGNOSIS — I10 Essential (primary) hypertension: Secondary | ICD-10-CM

## 2024-08-25 DIAGNOSIS — I2510 Atherosclerotic heart disease of native coronary artery without angina pectoris: Secondary | ICD-10-CM

## 2024-08-25 DIAGNOSIS — Z7689 Persons encountering health services in other specified circumstances: Secondary | ICD-10-CM

## 2024-09-08 ENCOUNTER — Ambulatory Visit: Admitting: Internal Medicine

## 2024-09-08 ENCOUNTER — Encounter: Payer: Self-pay | Admitting: Internal Medicine

## 2024-09-08 VITALS — BP 112/60 | HR 67 | Temp 98.4°F | Ht 64.0 in | Wt 162.6 lb

## 2024-09-08 DIAGNOSIS — F439 Reaction to severe stress, unspecified: Secondary | ICD-10-CM | POA: Diagnosis not present

## 2024-09-08 DIAGNOSIS — E1165 Type 2 diabetes mellitus with hyperglycemia: Secondary | ICD-10-CM

## 2024-09-08 DIAGNOSIS — I10 Essential (primary) hypertension: Secondary | ICD-10-CM

## 2024-09-08 DIAGNOSIS — E538 Deficiency of other specified B group vitamins: Secondary | ICD-10-CM | POA: Diagnosis not present

## 2024-09-08 DIAGNOSIS — R062 Wheezing: Secondary | ICD-10-CM | POA: Diagnosis not present

## 2024-09-08 DIAGNOSIS — G8929 Other chronic pain: Secondary | ICD-10-CM

## 2024-09-08 DIAGNOSIS — R911 Solitary pulmonary nodule: Secondary | ICD-10-CM

## 2024-09-08 DIAGNOSIS — E041 Nontoxic single thyroid nodule: Secondary | ICD-10-CM | POA: Diagnosis not present

## 2024-09-08 DIAGNOSIS — Z853 Personal history of malignant neoplasm of breast: Secondary | ICD-10-CM | POA: Diagnosis not present

## 2024-09-08 DIAGNOSIS — R9389 Abnormal findings on diagnostic imaging of other specified body structures: Secondary | ICD-10-CM

## 2024-09-08 DIAGNOSIS — R3129 Other microscopic hematuria: Secondary | ICD-10-CM

## 2024-09-08 DIAGNOSIS — R0602 Shortness of breath: Secondary | ICD-10-CM | POA: Diagnosis not present

## 2024-09-08 DIAGNOSIS — K219 Gastro-esophageal reflux disease without esophagitis: Secondary | ICD-10-CM

## 2024-09-08 DIAGNOSIS — M5442 Lumbago with sciatica, left side: Secondary | ICD-10-CM | POA: Diagnosis not present

## 2024-09-08 DIAGNOSIS — E7800 Pure hypercholesterolemia, unspecified: Secondary | ICD-10-CM

## 2024-09-08 MED ORDER — CITALOPRAM HYDROBROMIDE 40 MG PO TABS: 40.0000 mg | ORAL_TABLET | Freq: Every day | ORAL | 2 refills | Status: AC

## 2024-09-08 MED ORDER — CYANOCOBALAMIN 1000 MCG/ML IJ SOLN
1000.0000 ug | Freq: Once | INTRAMUSCULAR | Status: AC
  Administered 2024-09-08: 12:00:00 1000 ug via INTRAMUSCULAR

## 2024-09-08 MED ORDER — GABAPENTIN 600 MG PO TABS: 600.0000 mg | ORAL_TABLET | Freq: Three times a day (TID) | ORAL | 1 refills | Status: AC

## 2024-09-08 MED ORDER — ACYCLOVIR 400 MG PO TABS: ORAL_TABLET | ORAL | 1 refills | Status: AC

## 2024-09-08 NOTE — Progress Notes (Signed)
 "  Subjective:    Patient ID: Sherry Simon, female    DOB: [birth year], 78 y.o.   MRN: [id number]  Patient here for  Chief Complaint  Patient presents with   Medical Management of Chronic Issues    HPI Here for a scheduled follow up - follow up regarding hypertension, hypercholesterolemia and diabetes. Had f/u with cardiology 08/25/24 - recommended cardiac MRI to assess for amyloid deposition. Scheduled for cardiac MRI first week fo January. Also scheduled stress test to evaluate cardiac function. Stress test scheduled for tomorrow. Reported if she bends forward, it feels like her airway is being cut off. Some occasional wheezing. Had f/u with [facility] - 08/24/24 - chronic pain syndrome, chronic lumbar pain with lumbar radiculopathy.  Recommended hydrocodone and tylenol. Discussed PT. Consider right trochanteric bursa injection. Persistent increased stress. Discussed. Will notify me if feels needs further intervention.    Past Medical History:  Diagnosis Date   Anxiety and depression    Colon polyps    Depression    Diabetes mellitus (HCC)    Endometriosis    requiring hysterectomy   History of chicken pox    Hypercholesterolemia    Hypertension    Nephrolithiasis    Pericarditis    recurrent, unkown origin   Tachycardia    Past Surgical History:  Procedure Laterality Date   ABDOMINAL HYSTERECTOMY  1980   APPENDECTOMY  1055   BACK SURGERY  1005-2010   laminectomy   CHOLECYSTECTOMY     open   OOPHORECTOMY  1982   TONSILLECTOMY     Family History  Problem Relation Age of Onset   Heart disease Father        myocardial infarction - died 62   Thyroid  disease Mother    Transient ischemic attack Mother        multiple   Breast cancer Mother    Hyperlipidemia Mother    Kidney disease Mother    Diabetes Mother    Rheumatic fever Sister        mitral valve problems   Breast cancer Paternal Aunt    Other Sister        Small vessel disease   Colon cancer Neg Hx    Social  History   Socioeconomic History   Marital status: Married    Spouse name: Not on file   Number of children: 3   Years of education: Not on file   Highest education level: Not on file  Occupational History   Not on file  Tobacco Use   Smoking status: Never   Smokeless tobacco: Never  Vaping Use   Vaping status: Never Used  Substance and Sexual Activity   Alcohol use: No    Alcohol/week: 0.0 standard drinks of alcohol   Drug use: No   Sexual activity: Never  Other Topics Concern   Not on file  Social History Narrative   Not on file   Social Drivers of Health   Tobacco Use: Low Risk (09/18/2024)   Patient History    Smoking Tobacco Use: Never    Smokeless Tobacco Use: Never    Passive Exposure: Not on file  Recent Concern: Tobacco Use - Medium Risk (08/25/2024)   Received from [facility]   Patient History    Smoking Tobacco Use: Never    Smokeless Tobacco Use: Never    Passive Exposure: Yes  Financial Resource Strain: Low Risk (12/31/2022)   Overall Financial Resource Strain (CARDIA)    Difficulty of  Paying Living Expenses: Not hard at all  Food Insecurity: No Food Insecurity (12/31/2022)   Hunger Vital Sign    Worried About Running Out of Food in the Last Year: Never true    Ran Out of Food in the Last Year: Never true  Transportation Needs: No Transportation Needs (12/31/2022)   PRAPARE - Administrator, Civil Service (Medical): No    Lack of Transportation (Non-Medical): No  Physical Activity: Not on file  Stress: No Stress Concern Present (12/31/2022)   [employer] of Occupational Health - Occupational Stress Questionnaire    Feeling of Stress : Not at all  Social Connections: Unknown (12/31/2022)   Social Connection and Isolation Panel    Frequency of Communication with Friends and Family: Not on file    Frequency of Social Gatherings with Friends and Family: Not on file    Attends Religious Services: Not on file    Active Member of  Clubs or Organizations: Not on file    Attends Banker Meetings: Not on file    Marital Status: Married  Depression (PHQ2-9): Low Risk (09/08/2024)   Depression (PHQ2-9)    PHQ-2 Score: 0  Alcohol Screen: Not on file  Housing: Unknown (11/01/2023)   Received from [facility]   Epic    Unable to Pay for Housing in the Last Year: Not on file    Number of Times Moved in the Last Year: Not on file    At any time in the past 12 months, were you homeless or living in a shelter (including now)?: No  Utilities: Not At Risk (12/31/2022)   [facility] Utilities    Threatened with loss of utilities: No  Health Literacy: Not on file     Review of Systems  Constitutional:  Negative for appetite change and unexpected weight change.  HENT:  Negative for congestion and sinus pressure.   Respiratory:  Negative for cough and chest tightness.        Reports feels like airway cut off when bending forward.   Cardiovascular:  Negative for chest pain, palpitations and leg swelling.  Gastrointestinal:  Negative for abdominal pain, diarrhea, nausea and vomiting.  Genitourinary:  Negative for difficulty urinating and dysuria.  Musculoskeletal:  Positive for back pain.  Skin:  Negative for color change and rash.  Neurological:  Negative for dizziness and headaches.  Psychiatric/Behavioral:         Increased stress as outlined.        Objective:     BP 112/60   Pulse 67   Temp 98.4 F (36.9 C) (Oral)   Ht 5' 4 (1.626 m)   Wt 162 lb 9.6 oz (73.8 kg)   SpO2 97%   BMI 27.91 kg/m  Wt Readings from Last 3 Encounters:  09/08/24 162 lb 9.6 oz (73.8 kg)  06/29/24 162 lb 9.6 oz (73.8 kg)  03/17/24 161 lb 3.2 oz (73.1 kg)    Physical Exam Vitals reviewed.  Constitutional:      General: She is not in acute distress.    Appearance: Normal appearance.  HENT:     Head: Normocephalic and atraumatic.     Right Ear: External ear normal.     Left Ear: External ear normal.      Mouth/Throat:     Pharynx: No oropharyngeal exudate or posterior oropharyngeal erythema.  Eyes:     General: No scleral icterus.       Right eye: No discharge.  Left eye: No discharge.     Conjunctiva/sclera: Conjunctivae normal.  Neck:     Thyroid : No thyromegaly.  Cardiovascular:     Rate and Rhythm: Normal rate and regular rhythm.  Pulmonary:     Effort: No respiratory distress.     Breath sounds: Normal breath sounds. No wheezing.  Abdominal:     General: Bowel sounds are normal.     Palpations: Abdomen is soft.     Tenderness: There is no abdominal tenderness.  Musculoskeletal:        General: No swelling or tenderness.     Cervical back: Neck supple. No tenderness.  Lymphadenopathy:     Cervical: No cervical adenopathy.  Skin:    Findings: No erythema or rash.  Neurological:     Mental Status: She is alert.  Psychiatric:        Mood and Affect: Mood normal.        Behavior: Behavior normal.         Outpatient Encounter Medications as of 09/08/2024  Medication Sig   aspirin 81 MG EC tablet Take by mouth.   atorvastatin  (LIPITOR) 40 MG tablet TAKE 1 TABLET BY MOUTH DAILY   Calcium  Carbonate-Vitamin D 600-400 MG-UNIT tablet Take by mouth.   clobetasol  ointment (TEMOVATE ) 0.05 % Apply pea size amount daily x 4 weeks, then every other day x 4 weeks, then 2-3 times weekly for maintenance   ferrous sulfate 325 (65 FE) MG EC tablet Take 325 mg by mouth daily with breakfast.    glucose blood (ONE TOUCH ULTRA TEST) test strip TEST BLOOD SUGAR TWICE A DAY   HYDROcodone-acetaminophen (NORCO) 10-325 MG tablet TAKE 1 TABLET BY MOUTH THREE TIMES DAILY AS NEEDED FOR CHRONIC PAIN   latanoprost  (XALATAN ) 0.005 % ophthalmic solution INSTILL 1 DROP INTO THE  LEFT EYE AT BEDTIME  (REFRIGERATE UNTIL FIRST  OPENED FOR USE) (Patient taking differently: Place 1 drop into both eyes.)   Melatonin 5 MG CAPS Take by mouth.   omeprazole  (PRILOSEC) 40 MG capsule TAKE 1 CAPSULE BY MOUTH  DAILY   [DISCONTINUED] acyclovir  (ZOVIRAX ) 400 MG tablet TAKE 1 TABLET BY MOUTH  DAILY   [DISCONTINUED] citalopram  (CELEXA ) 40 MG tablet TAKE 1 TABLET BY MOUTH DAILY   [DISCONTINUED] gabapentin  (NEURONTIN ) 600 MG tablet TAKE 2 TABLETS BY MOUTH IN THE  MORNING AND 3 TABLETS BY MOUTH  IN THE EVENING   acyclovir  (ZOVIRAX ) 400 MG tablet TAKE 1 TABLET BY MOUTH  DAILY   citalopram  (CELEXA ) 40 MG tablet Take 1 tablet (40 mg total) by mouth daily.   gabapentin  (NEURONTIN ) 600 MG tablet Take 1 tablet (600 mg total) by mouth 3 (three) times daily.   [DISCONTINUED] methylPREDNISolone  (MEDROL  DOSEPAK) 4 MG TBPK tablet Medrol  dosepak 6 day taper. Take as directed.   [EXPIRED] cyanocobalamin  (VITAMIN B12) injection 1,000 mcg    No facility-administered encounter medications on file as of 09/08/2024.     Lab Results  Component Value Date   WBC 4.7 06/29/2024   HGB 12.5 06/29/2024   HCT 37.7 06/29/2024   PLT 294.0 06/29/2024   GLUCOSE 93 06/29/2024   CHOL 150 06/29/2024   TRIG 102.0 06/29/2024   HDL 48.80 06/29/2024   LDLCALC 81 06/29/2024   ALT 14 06/29/2024   AST 14 06/29/2024   NA 141 06/29/2024   K 4.5 06/29/2024   CL 103 06/29/2024   CREATININE 0.61 06/29/2024   BUN 13 06/29/2024   CO2 29 06/29/2024   TSH 1.23 06/29/2024  HGBA1C 6.7 (H) 06/29/2024   MICROALBUR <0.7 11/21/2023       Assessment & Plan:  Primary hypertension Assessment & Plan: Not on medication currently. Blood pressure as outlined. Follow pressures. Follow metabolic panel.   Orders: -     Basic metabolic panel with GFR; Future  Hypercholesterolemia Assessment & Plan: Continue lipitor. Low cholesterol diet and exercise. Follow lipid panel and liver function tests. No change in medication today.   Orders: -     Hepatic function panel; Future -     Lipid panel; Future  Type 2 diabetes mellitus with hyperglycemia, without long-term current use of insulin (HCC) Assessment & Plan: Low carb diet and exercise.  Follow met b and A1c.  Lab Results  Component Value Date   HGBA1C 6.7 (H) 06/29/2024     Orders: -     Hemoglobin A1c; Future  Abnormal CT scan, chest Assessment & Plan:  Did have CT chest - 05/28/24 - no evidence of pneumonia. Did mention progressive endplate irregularity of 2 thoracic vertebral bodies consider changes from chronic infection or amyloid. Stable healed right-sided rib fracture 06/08/24 - addendum to the CT report  related to the progressive irregularity of the endplates at likely the T8-T9 or T9-T10 level.  The finding is progressed/new from 2021 and the absence of clinical signs of prior chronic infection this could be favored to be related to degenerative given as there is some slight progressive scoliosis also in this region. Had f/u MRI 07/12/24 - Diffuse endplate irregularity throughout the thoracic spine, favored  degenerative in etiology. Planning cardiac MRI first of January.    Essential hypertension -     Citalopram  Hydrobromide; Take 1 tablet (40 mg total) by mouth daily.  Dispense: 100 tablet; Refill: 2  B12 deficiency Assessment & Plan: Continue b12 injections.   Orders: -     Cyanocobalamin   Wheezing Assessment & Plan: Occasional wheezing as outlined. SOB and feeling as if airway being cut off with bending. Cardiac w/up in progressl. Discussed pulmonary evaluation. Discussed further w/up - possible PFTs, etc.    Thyroid  nodule Assessment & Plan: S/p previous biopsy. Follow tsh.    Stress Assessment & Plan: Increased stress. Discussed. Continue citalopram . Notify me if feels she needs further intervention. Follow.    SOB (shortness of breath) Assessment & Plan: Occasional wheezing as outlined. SOB and feeling as if airway being cut off with bending. Cardiac w/up in progress. Scheduled for stress test tomorrow. Scheduled for cardiac MRI first of January.  Discussed pulmonary evaluation. Discussed further w/up - possible PFTs, etc.    Microscopic  hematuria Assessment & Plan: Dr Francisca 12/2023 - check urine. If culture negative, plan cystoscopy and CT. Urinalysis 06/2024 - negative blood, negative red blood cells.    Lung nodule Assessment & Plan: Had f/u chest CT 05/28/24 - as outlined. No worrisome pulmonary nodules - per report.    History of breast cancer Assessment & Plan: Followed by oncology. Mammogram 04/03/24 - Birads II.    Gastroesophageal reflux disease, unspecified whether esophagitis present Assessment & Plan: No increased acid reflux reported. Continue prilosec.    Chronic left-sided low back pain with left-sided sciatica Assessment & Plan: Followed by [facility].    Other orders -     Gabapentin ; Take 1 tablet (600 mg total) by mouth 3 (three) times daily.  Dispense: 270 tablet; Refill: 1 -     Acyclovir ; TAKE 1 TABLET BY MOUTH  DAILY  Dispense: 90 tablet; Refill: 1  Allena Hamilton, MD "

## 2024-09-08 NOTE — Assessment & Plan Note (Addendum)
"   Did have CT chest - 05/28/24 - no evidence of pneumonia. Did mention progressive endplate irregularity of 2 thoracic vertebral bodies consider changes from chronic infection or amyloid. Stable healed right-sided rib fracture 06/08/24 - addendum to the CT report  related to the progressive irregularity of the endplates at likely the T8-T9 or T9-T10 level.  The finding is progressed/new from 2021 and the absence of clinical signs of prior chronic infection this could be favored to be related to degenerative given as there is some slight progressive scoliosis also in this region. Had f/u MRI 07/12/24 - Diffuse endplate irregularity throughout the thoracic spine, favored  degenerative in etiology. Planning cardiac MRI first of January.  "

## 2024-09-18 ENCOUNTER — Encounter: Payer: Self-pay | Admitting: Internal Medicine

## 2024-09-18 ENCOUNTER — Encounter (HOSPITAL_COMMUNITY): Payer: Self-pay

## 2024-09-18 NOTE — Assessment & Plan Note (Signed)
 Had f/u chest CT 05/28/24 - as outlined. No worrisome pulmonary nodules - per report.

## 2024-09-18 NOTE — Assessment & Plan Note (Signed)
 Not on medication currently. Blood pressure as outlined. Follow pressures. Follow metabolic panel.

## 2024-09-18 NOTE — Assessment & Plan Note (Signed)
Continue b12 injections.  

## 2024-09-18 NOTE — Assessment & Plan Note (Signed)
 Followed by oncology. Mammogram 04/03/24 - Birads II.

## 2024-09-18 NOTE — Assessment & Plan Note (Signed)
Followed by [facility].  

## 2024-09-18 NOTE — Assessment & Plan Note (Signed)
 Increased stress. Discussed. Continue citalopram . Notify me if feels she needs further intervention. Follow.

## 2024-09-18 NOTE — Assessment & Plan Note (Signed)
 Occasional wheezing as outlined. SOB and feeling as if airway being cut off with bending. Cardiac w/up in progress. Scheduled for stress test tomorrow. Scheduled for cardiac MRI first of January.  Discussed pulmonary evaluation. Discussed further w/up - possible PFTs, etc.

## 2024-09-18 NOTE — Assessment & Plan Note (Signed)
 Dr Francisca 12/2023 - check urine. If culture negative, plan cystoscopy and CT. Urinalysis 06/2024 - negative blood, negative red blood cells.

## 2024-09-18 NOTE — Assessment & Plan Note (Signed)
 Low carb diet and exercise. Follow met b and A1c.  Lab Results  Component Value Date   HGBA1C 6.7 (H) 06/29/2024

## 2024-09-18 NOTE — Assessment & Plan Note (Signed)
 Continue lipitor.  Low cholesterol diet and exercise.  Follow lipid panel and liver function tests. No change in medication today.

## 2024-09-18 NOTE — Assessment & Plan Note (Signed)
 No increased acid reflux reported.  Continue prilosec.

## 2024-09-18 NOTE — Assessment & Plan Note (Signed)
S/p previous biopsy.  Follow tsh.  

## 2024-09-18 NOTE — Assessment & Plan Note (Signed)
 Occasional wheezing as outlined. SOB and feeling as if airway being cut off with bending. Cardiac w/up in progressl. Discussed pulmonary evaluation. Discussed further w/up - possible PFTs, etc.

## 2024-09-21 ENCOUNTER — Other Ambulatory Visit: Payer: Self-pay | Admitting: Internal Medicine

## 2024-09-21 ENCOUNTER — Ambulatory Visit
Admission: RE | Admit: 2024-09-21 | Discharge: 2024-09-21 | Disposition: A | Discharge status: Home / Self Care | Source: Ambulatory Visit | Attending: Internal Medicine | Admitting: Internal Medicine

## 2024-09-21 DIAGNOSIS — Z0389 Encounter for observation for other suspected diseases and conditions ruled out: Secondary | ICD-10-CM | POA: Diagnosis present

## 2024-09-21 DIAGNOSIS — I2510 Atherosclerotic heart disease of native coronary artery without angina pectoris: Secondary | ICD-10-CM | POA: Diagnosis present

## 2024-09-21 DIAGNOSIS — E782 Mixed hyperlipidemia: Secondary | ICD-10-CM | POA: Diagnosis present

## 2024-09-21 DIAGNOSIS — I10 Essential (primary) hypertension: Secondary | ICD-10-CM | POA: Insufficient documentation

## 2024-09-21 DIAGNOSIS — Z7689 Persons encountering health services in other specified circumstances: Secondary | ICD-10-CM | POA: Diagnosis present

## 2024-09-21 MED ORDER — GADOBUTROL 1 MMOL/ML IV SOLN
10.0000 mL | Freq: Once | INTRAVENOUS | Status: AC | PRN
  Administered 2024-09-21: 10 mL via INTRAVENOUS

## 2024-09-30 ENCOUNTER — Other Ambulatory Visit

## 2024-10-26 ENCOUNTER — Ambulatory Visit

## 2024-11-16 ENCOUNTER — Other Ambulatory Visit

## 2024-11-18 ENCOUNTER — Ambulatory Visit: Admitting: Internal Medicine

## 2025-01-13 ENCOUNTER — Ambulatory Visit
